# Patient Record
Sex: Female | Born: 1970
Health system: Southern US, Community
[De-identification: ages and names within clinical notes are randomized; demographics above are authoritative.]

## PROBLEM LIST (undated history)

## (undated) DIAGNOSIS — K219 Gastro-esophageal reflux disease without esophagitis: Secondary | ICD-10-CM

## (undated) DIAGNOSIS — E669 Obesity, unspecified: Secondary | ICD-10-CM

## (undated) DIAGNOSIS — I499 Cardiac arrhythmia, unspecified: Secondary | ICD-10-CM

## (undated) DIAGNOSIS — J189 Pneumonia, unspecified organism: Secondary | ICD-10-CM

## (undated) DIAGNOSIS — I10 Essential (primary) hypertension: Secondary | ICD-10-CM

## (undated) DIAGNOSIS — D259 Leiomyoma of uterus, unspecified: Secondary | ICD-10-CM

## (undated) DIAGNOSIS — E039 Hypothyroidism, unspecified: Secondary | ICD-10-CM

## (undated) DIAGNOSIS — E119 Type 2 diabetes mellitus without complications: Secondary | ICD-10-CM

## (undated) HISTORY — DX: Leiomyoma of uterus, unspecified: D25.9

## (undated) HISTORY — DX: Hypothyroidism, unspecified: E03.9

## (undated) HISTORY — DX: Gastro-esophageal reflux disease without esophagitis: K21.9

## (undated) HISTORY — DX: Obesity, unspecified: E66.9

## (undated) HISTORY — DX: Type 2 diabetes mellitus without complications: E11.9

---

## 1998-05-05 HISTORY — PX: DILATION AND CURETTAGE OF UTERUS: SHX78

## 2005-03-14 ENCOUNTER — Encounter (INDEPENDENT_AMBULATORY_CARE_PROVIDER_SITE_OTHER): Payer: Self-pay | Admitting: *Deleted

## 2005-03-14 ENCOUNTER — Other Ambulatory Visit: Admission: RE | Admit: 2005-03-14 | Discharge: 2005-03-14 | Payer: Self-pay | Admitting: Internal Medicine

## 2005-03-14 ENCOUNTER — Ambulatory Visit: Payer: Self-pay | Admitting: Internal Medicine

## 2005-05-30 ENCOUNTER — Ambulatory Visit: Payer: Self-pay | Admitting: Internal Medicine

## 2005-11-07 ENCOUNTER — Ambulatory Visit: Payer: Self-pay | Admitting: Internal Medicine

## 2005-11-19 ENCOUNTER — Ambulatory Visit: Payer: Self-pay | Admitting: Internal Medicine

## 2005-12-03 ENCOUNTER — Ambulatory Visit: Payer: Self-pay | Admitting: Internal Medicine

## 2005-12-17 ENCOUNTER — Ambulatory Visit: Payer: Self-pay | Admitting: Internal Medicine

## 2006-01-21 ENCOUNTER — Ambulatory Visit: Payer: Self-pay | Admitting: *Deleted

## 2006-02-04 ENCOUNTER — Ambulatory Visit: Payer: Self-pay | Admitting: Internal Medicine

## 2006-02-20 ENCOUNTER — Ambulatory Visit: Payer: Self-pay | Admitting: Internal Medicine

## 2006-02-25 ENCOUNTER — Ambulatory Visit: Payer: Self-pay | Admitting: Internal Medicine

## 2006-04-08 ENCOUNTER — Ambulatory Visit: Payer: Self-pay | Admitting: Internal Medicine

## 2006-06-23 ENCOUNTER — Ambulatory Visit: Payer: Self-pay | Admitting: Internal Medicine

## 2006-07-17 ENCOUNTER — Ambulatory Visit: Payer: Self-pay | Admitting: Family Medicine

## 2006-07-22 DIAGNOSIS — I10 Essential (primary) hypertension: Secondary | ICD-10-CM | POA: Insufficient documentation

## 2006-10-12 ENCOUNTER — Ambulatory Visit: Payer: Self-pay | Admitting: Internal Medicine

## 2006-11-16 ENCOUNTER — Ambulatory Visit: Payer: Self-pay | Admitting: Internal Medicine

## 2006-11-16 ENCOUNTER — Telehealth: Payer: Self-pay | Admitting: Internal Medicine

## 2007-04-05 DIAGNOSIS — R87610 Atypical squamous cells of undetermined significance on cytologic smear of cervix (ASC-US): Secondary | ICD-10-CM | POA: Insufficient documentation

## 2007-04-15 ENCOUNTER — Ambulatory Visit: Payer: Self-pay | Admitting: Internal Medicine

## 2007-04-22 ENCOUNTER — Encounter: Payer: Self-pay | Admitting: Internal Medicine

## 2007-05-14 ENCOUNTER — Ambulatory Visit: Payer: Self-pay | Admitting: Internal Medicine

## 2007-05-14 DIAGNOSIS — L919 Hypertrophic disorder of the skin, unspecified: Secondary | ICD-10-CM

## 2007-05-14 DIAGNOSIS — L909 Atrophic disorder of skin, unspecified: Secondary | ICD-10-CM | POA: Insufficient documentation

## 2007-10-04 ENCOUNTER — Ambulatory Visit: Payer: Self-pay | Admitting: Internal Medicine

## 2007-10-05 ENCOUNTER — Encounter: Payer: Self-pay | Admitting: Internal Medicine

## 2007-10-14 ENCOUNTER — Ambulatory Visit: Payer: Self-pay | Admitting: Family Medicine

## 2007-10-14 ENCOUNTER — Encounter (INDEPENDENT_AMBULATORY_CARE_PROVIDER_SITE_OTHER): Payer: Self-pay | Admitting: Internal Medicine

## 2007-10-14 LAB — CONVERTED CEMR LAB: KOH Prep: 0

## 2007-10-22 LAB — CONVERTED CEMR LAB: Chlamydia, DNA Probe: NEGATIVE

## 2007-11-10 ENCOUNTER — Telehealth (INDEPENDENT_AMBULATORY_CARE_PROVIDER_SITE_OTHER): Payer: Self-pay | Admitting: Internal Medicine

## 2008-04-18 ENCOUNTER — Ambulatory Visit: Payer: Self-pay | Admitting: Internal Medicine

## 2008-04-21 ENCOUNTER — Encounter: Payer: Self-pay | Admitting: Internal Medicine

## 2008-06-13 ENCOUNTER — Ambulatory Visit: Payer: Self-pay | Admitting: Family Medicine

## 2008-10-10 ENCOUNTER — Ambulatory Visit: Payer: Self-pay | Admitting: Family Medicine

## 2008-10-18 ENCOUNTER — Ambulatory Visit: Payer: Self-pay | Admitting: Internal Medicine

## 2008-10-18 DIAGNOSIS — D259 Leiomyoma of uterus, unspecified: Secondary | ICD-10-CM | POA: Insufficient documentation

## 2008-10-18 DIAGNOSIS — K219 Gastro-esophageal reflux disease without esophagitis: Secondary | ICD-10-CM | POA: Insufficient documentation

## 2008-10-31 ENCOUNTER — Ambulatory Visit: Payer: Self-pay | Admitting: Obstetrics & Gynecology

## 2009-04-24 ENCOUNTER — Ambulatory Visit: Payer: Self-pay | Admitting: Internal Medicine

## 2009-04-30 ENCOUNTER — Encounter: Payer: Self-pay | Admitting: Internal Medicine

## 2009-05-09 ENCOUNTER — Ambulatory Visit: Payer: Self-pay | Admitting: Internal Medicine

## 2009-05-11 LAB — CONVERTED CEMR LAB
Free T4: 1.7 ng/dL — ABNORMAL HIGH (ref 0.6–1.6)
TSH: 0.02 microintl units/mL — ABNORMAL LOW (ref 0.35–5.50)

## 2009-05-23 ENCOUNTER — Telehealth: Payer: Self-pay | Admitting: Internal Medicine

## 2009-05-23 ENCOUNTER — Encounter: Payer: Self-pay | Admitting: Internal Medicine

## 2009-05-23 ENCOUNTER — Other Ambulatory Visit: Payer: Self-pay | Admitting: Internal Medicine

## 2009-05-24 ENCOUNTER — Ambulatory Visit: Payer: Self-pay | Admitting: Internal Medicine

## 2009-05-25 ENCOUNTER — Encounter: Payer: Self-pay | Admitting: Internal Medicine

## 2009-06-04 ENCOUNTER — Encounter: Payer: Self-pay | Admitting: Internal Medicine

## 2009-06-07 ENCOUNTER — Ambulatory Visit: Payer: Self-pay | Admitting: Endocrinology

## 2009-06-11 ENCOUNTER — Telehealth (INDEPENDENT_AMBULATORY_CARE_PROVIDER_SITE_OTHER): Payer: Self-pay | Admitting: *Deleted

## 2009-06-18 ENCOUNTER — Other Ambulatory Visit: Payer: Self-pay | Admitting: Internal Medicine

## 2009-06-18 ENCOUNTER — Encounter: Payer: Self-pay | Admitting: Internal Medicine

## 2009-06-19 ENCOUNTER — Ambulatory Visit: Payer: Self-pay | Admitting: Endocrinology

## 2009-06-19 ENCOUNTER — Encounter: Payer: Self-pay | Admitting: Endocrinology

## 2009-06-25 ENCOUNTER — Ambulatory Visit: Payer: Self-pay | Admitting: Endocrinology

## 2009-07-31 ENCOUNTER — Ambulatory Visit: Payer: Self-pay | Admitting: Endocrinology

## 2009-08-02 ENCOUNTER — Ambulatory Visit: Payer: Self-pay | Admitting: Endocrinology

## 2009-08-02 LAB — CONVERTED CEMR LAB: Free T4: 1 ng/dL (ref 0.6–1.6)

## 2009-08-10 ENCOUNTER — Ambulatory Visit: Payer: Self-pay | Admitting: Internal Medicine

## 2009-08-13 ENCOUNTER — Ambulatory Visit: Payer: Self-pay | Admitting: Internal Medicine

## 2009-08-31 ENCOUNTER — Encounter (INDEPENDENT_AMBULATORY_CARE_PROVIDER_SITE_OTHER): Payer: Self-pay | Admitting: *Deleted

## 2009-08-31 ENCOUNTER — Encounter: Payer: Self-pay | Admitting: Endocrinology

## 2009-09-06 ENCOUNTER — Encounter (INDEPENDENT_AMBULATORY_CARE_PROVIDER_SITE_OTHER): Payer: Self-pay | Admitting: *Deleted

## 2009-09-07 ENCOUNTER — Ambulatory Visit: Payer: Self-pay | Admitting: Endocrinology

## 2009-09-07 DIAGNOSIS — E039 Hypothyroidism, unspecified: Secondary | ICD-10-CM | POA: Insufficient documentation

## 2009-10-10 ENCOUNTER — Encounter: Payer: Self-pay | Admitting: Endocrinology

## 2009-10-23 ENCOUNTER — Ambulatory Visit: Payer: Self-pay | Admitting: Endocrinology

## 2009-10-23 ENCOUNTER — Telehealth: Payer: Self-pay | Admitting: Endocrinology

## 2009-11-02 ENCOUNTER — Ambulatory Visit: Payer: Self-pay | Admitting: Internal Medicine

## 2009-11-02 DIAGNOSIS — E039 Hypothyroidism, unspecified: Secondary | ICD-10-CM | POA: Insufficient documentation

## 2009-11-28 ENCOUNTER — Encounter: Payer: Self-pay | Admitting: Endocrinology

## 2009-12-04 ENCOUNTER — Ambulatory Visit: Payer: Self-pay | Admitting: Endocrinology

## 2009-12-10 ENCOUNTER — Ambulatory Visit: Payer: Self-pay | Admitting: Family Medicine

## 2009-12-10 DIAGNOSIS — N898 Other specified noninflammatory disorders of vagina: Secondary | ICD-10-CM | POA: Insufficient documentation

## 2009-12-13 ENCOUNTER — Telehealth: Payer: Self-pay | Admitting: Family Medicine

## 2009-12-14 ENCOUNTER — Ambulatory Visit: Payer: Self-pay | Admitting: Family Medicine

## 2009-12-14 DIAGNOSIS — R109 Unspecified abdominal pain: Secondary | ICD-10-CM | POA: Insufficient documentation

## 2009-12-14 LAB — CONVERTED CEMR LAB: Whiff Test: POSITIVE

## 2009-12-17 ENCOUNTER — Encounter: Payer: Self-pay | Admitting: Family Medicine

## 2009-12-19 ENCOUNTER — Ambulatory Visit: Payer: Self-pay | Admitting: Family Medicine

## 2009-12-19 ENCOUNTER — Encounter: Payer: Self-pay | Admitting: Family Medicine

## 2010-01-01 ENCOUNTER — Ambulatory Visit: Payer: Self-pay | Admitting: Family Medicine

## 2010-01-28 ENCOUNTER — Encounter: Payer: Self-pay | Admitting: Endocrinology

## 2010-01-29 ENCOUNTER — Encounter: Payer: Self-pay | Admitting: Endocrinology

## 2010-01-31 ENCOUNTER — Ambulatory Visit: Payer: Self-pay | Admitting: Obstetrics & Gynecology

## 2010-04-03 ENCOUNTER — Ambulatory Visit: Payer: Self-pay | Admitting: Obstetrics & Gynecology

## 2010-04-04 ENCOUNTER — Encounter: Payer: Self-pay | Admitting: Obstetrics & Gynecology

## 2010-04-04 LAB — CONVERTED CEMR LAB
Clue Cells Wet Prep HPF POC: NONE SEEN
Trich, Wet Prep: NONE SEEN

## 2010-04-18 ENCOUNTER — Ambulatory Visit: Payer: Self-pay | Admitting: Obstetrics and Gynecology

## 2010-04-19 ENCOUNTER — Encounter: Payer: Self-pay | Admitting: Internal Medicine

## 2010-04-20 LAB — CONVERTED CEMR LAB
Trich, Wet Prep: NONE SEEN
Yeast Wet Prep HPF POC: NONE SEEN

## 2010-04-23 ENCOUNTER — Ambulatory Visit: Payer: Self-pay | Admitting: Internal Medicine

## 2010-05-22 ENCOUNTER — Encounter: Payer: Self-pay | Admitting: Internal Medicine

## 2010-05-22 ENCOUNTER — Ambulatory Visit
Admission: RE | Admit: 2010-05-22 | Discharge: 2010-05-22 | Payer: Self-pay | Source: Home / Self Care | Attending: Internal Medicine | Admitting: Internal Medicine

## 2010-05-22 DIAGNOSIS — J069 Acute upper respiratory infection, unspecified: Secondary | ICD-10-CM | POA: Insufficient documentation

## 2010-06-02 LAB — CONVERTED CEMR LAB
ALT: 20 units/L (ref 0–35)
AST: 19 units/L (ref 0–37)
Albumin: 3.8 g/dL (ref 3.5–5.2)
Blood in Urine, dipstick: NEGATIVE
Eosinophils Relative: 1.9 % (ref 0.0–5.0)
GFR calc non Af Amer: 101.71 mL/min (ref >60)
Glucose, Bld: 81 mg/dL (ref 70–99)
HCT: 39.5 % (ref 36.0–46.0)
Hemoglobin: 12.6 g/dL (ref 12.0–15.0)
Ketones, urine, test strip: NEGATIVE
Lymphs Abs: 1.5 10*3/uL (ref 0.7–4.0)
MCHC: 32 g/dL (ref 30.0–36.0)
MCV: 83.1 fL (ref 78.0–100.0)
Monocytes Relative: 16.5 % — ABNORMAL HIGH (ref 3.0–12.0)
Neutro Abs: 2.2 10*3/uL (ref 1.4–7.7)
Neutrophils Relative %: 48.4 % (ref 43.0–77.0)
Nitrite: NEGATIVE
RDW: 13.7 % (ref 11.5–14.6)
Sodium: 137 meq/L (ref 135–145)
Specific Gravity, Urine: <1.005
Total Bilirubin: 0.7 mg/dL (ref 0.3–1.2)
Total Protein: 7.9 g/dL (ref 6.0–8.3)
Urobilinogen, UA: 0.2
WBC: 4.6 10*3/uL (ref 4.5–10.5)
pH: 7.5

## 2010-06-03 ENCOUNTER — Telehealth: Payer: Self-pay | Admitting: Internal Medicine

## 2010-06-05 NOTE — Progress Notes (Signed)
Summary: abd pain  Phone Note Call from Patient Call back at Home Phone [phone]   Caller: Patient Summary of Call: Pt was seen on monday for a yeast infection and given diflucan.  After she took it she says her stomach starting hurting and still does, lower abd pain.  She had some nausea and dizziness the day after she took the diflucan, no other sxs now, just the pain, which she states is not severe.  I advised pt that these sxs are all possible side effects of the medicine but she is asking how long will the pain last.   Initial call taken by: Lowella Petties CMA,  December 13, 2009 9:06 AM  Follow-up for Phone Call        She should be feeling better.  If not better by tomorrow, needs to be seen. Ruthe Mannan MD  December 13, 2009 9:11 AM  Advised pt, she went ahead and scheduled appt, will call early to cancel if she is feeling better. Follow-up by: Lowella Petties CMA,  December 13, 2009 9:40 AM

## 2010-06-05 NOTE — Assessment & Plan Note (Signed)
Summary: ABD PAIN   Vital Signs:  Patient profile:   40 year old female Height:      67 inches Weight:      169.38 pounds BMI:     26.62 Temp:     100.2 degrees F oral Pulse rate:   72 / minute Pulse rhythm:   regular BP sitting:   120 / 82  (right arm) Cuff size:   regular  Vitals Entered By: Linde Gillis CMA Duncan Dull) (December 14, 2009 2:39 PM) CC: abdominal pain getting worse   History of Present Illness: 40 yo here for follow up yeast infection.  Saw her 4 days ago with itching and vaginal discharge.  Wet prep consistent with yeast infection.  She has one sexual partner and was not concerned about STDs and refused testing at the time.  Since finishing Diflucan, actually feels worse.  Has lower abdominal pain, fever, worsening discharge. No dysuria, increased urinary frequency or back pain.  Current Medications (verified): 1)  Maxzide-25 37.5-25 Mg Tabs (Triamterene-Hctz) .... Take One By Mouth Daily 2)  Aspirin 81 Mg Tbec (Aspirin) .... Take 1 Tablet By Mouth Once A Day 3)  Omeprazole 20 Mg  Tbec (Omeprazole) .Marland Kitchen.. 1 Daily As Needed For Heartburn 4)  Guanfacine Hcl 1 Mg Tabs (Guanfacine Hcl) .Marland Kitchen.. 1 At Bedtime 5)  Tsh .... 242.9.  Please Do Late Sept 2011 6)  Levothyroxine Sodium 100 Mcg Tabs (Levothyroxine Sodium) .Marland Kitchen.. 1 Once Daily 7)  Diflucan 150 Mg Tabs (Fluconazole) .... Once Daily 8)  Doxycycline Hyclate 100 Mg Caps (Doxycycline Hyclate) .... Take 1 Tab Twice A Day X 2 Weeks 9)  Flagyl 500 Mg Tabs (Metronidazole) .Marland Kitchen.. 1 Tab By Mouth Two Times A Day X 14 Days  Allergies (verified): No Known Drug Allergies  Past History:  Past Medical History: Last updated: 11/02/2009 Hypertension GERD Uterine fibroids Hypothyroidism  (following radioactive iodine Rx)  Past Surgical History: Last updated: 11/02/2009 D&C  2000 SVD X 1 1989 2011 Radioactive iodine Rx for Grave's disease  Family History: Last updated: 06-24-2009 Dad  died @74 --CHF---CAD(CABG), DM, HTN Mom in  60's--HTN Pat GF died o lung cancer Mat GM died of ?intestinal cancer/pulm. embolus 1 sister with cerebral palsy, HTN Sister has arthritis unceratin if any thyroid probs.  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 GI:  Denies nausea and vomiting. GU:  Complains of discharge; denies genital sores, urinary frequency, and urinary hesitancy.  Physical Exam  General:  alert and normal appearance.   Genitalia:  normal introitus, no external lesions, normal uterus size and position, no adnexal masses. Copious, grey vaginal discharge, malodorous, does have some cervical motion tenderness (mild) Psych:  normally interactive, good eye contact, not anxious appearing, and not depressed appearing.     Impression & Recommendations:  Problem # 1:  ABDOMINAL PAIN OTHER SPECIFIED SITE (ICD-789.09) Assessment New with pos cervical motion tenderness with fever. Concern for PID.  Pt declined giving urine sample.  Wet prep also pos for BV (see below). Will treat with Doxycycline, flagyl, Ceftriaxone.  Red flags given requiring immediate follow up over the weekend. Her updated medication list for this problem includes:    Aspirin 81 Mg Tbec (Aspirin) .Marland Kitchen... Take 1 tablet by mouth once a day  Orders: UA Dipstick w/o Micro (manual) ([address])  Problem # 2:  VAGINAL DISCHARGE (ICD-623.5) Assessment: New consistent with BV.  Will treat with flagyl.  See above.  Will also check GC/Chlamydia. Orders: Wet Prep ([id number]) T-Chlamydia Probe, genital [phone]) T-GC Probe, genital ([id number]) Specimen Handling (46962)  Complete Medication List: 1)  Maxzide-25 37.5-25 Mg Tabs (Triamterene-hctz) .... Take one by mouth daily 2)  Aspirin 81 Mg Tbec (Aspirin) .... Take 1 tablet by mouth once a day 3)   Omeprazole 20 Mg Tbec (Omeprazole) .Marland Kitchen.. 1 daily as needed for heartburn 4)  Guanfacine Hcl 1 Mg Tabs (Guanfacine hcl) .Marland Kitchen.. 1 at bedtime 5)  Tsh  .... 242.9.  please do late sept 2011 6)  Levothyroxine Sodium 100 Mcg Tabs (Levothyroxine sodium) .Marland Kitchen.. 1 once daily 7)  Diflucan 150 Mg Tabs (Fluconazole) .... Once daily 8)  Doxycycline Hyclate 100 Mg Caps (Doxycycline hyclate) .... Take 1 tab twice a day x 2 weeks 9)  Flagyl 500 Mg Tabs (Metronidazole) .Marland Kitchen.. 1 tab by mouth two times a day x 14 days Prescriptions: DIFLUCAN 150 MG TABS (FLUCONAZOLE) once daily  #1 x 0   Entered and Authorized by:   Ruthe Mannan MD   Signed by:   Ruthe Mannan MD on 12/14/2009   Method used:   Print then Give to Patient   RxID:   [id number] FLAGYL 500 MG TABS (METRONIDAZOLE) 1 tab by mouth two times a day x 14 days  #28 x 0   Entered and Authorized by:   Ruthe Mannan MD   Signed by:   Ruthe Mannan MD on 12/14/2009   Method used:   Electronically to        [facility] (retail)       [address]       [place], [place]  [address]       Ph: [phone]       Fax: [phone]   RxID:   [id number] DOXYCYCLINE HYCLATE 100 MG CAPS (DOXYCYCLINE HYCLATE) Take 1 tab twice a day x 2 weeks  #28 x 0   Entered and Authorized by:   Ruthe Mannan MD   Signed by:   Ruthe Mannan MD on 12/14/2009   Method used:   Electronically to        [facility] (retail)       [address]       [place], [place]  [address]       Ph: [phone]       Fax: [phone]   RxID:   [id number]   Current Allergies (reviewed today): No known allergies   Laboratory Results    Wet [place]/KOH Source: vaginal WBC/hpf 1-5 Bacteria/hpf 2+ Clue cells/hpf moderate  Positive whiff Yeast/hpf none Trichomonas/hpf none

## 2010-06-05 NOTE — Assessment & Plan Note (Signed)
Summary: new endo / graves ds / [facility]ak / [facility] / cd   Vital Signs:  Patient profile:   40 year old female Height:      67 inches (170.18 cm) Weight:      173.50 pounds (78.86 kg) O2 Sat:      99 % on Room air Temp:     97.1 degrees F (36.17 degrees C) oral Pulse rate:   99 / minute BP sitting:   136 / 88  (left arm) Cuff size:   large  Vitals Entered By: Josph Macho CMA (June 07, 2009 8:54 AM)  O2 Flow:  Room air CC: New Endo: Graves Disease/ CF Is Patient Diabetic? No   Referring Provider:  Cindee Salt MD Primary Provider:  Cindee Salt MD  CC:  New EndoLuiz Blare Disease/ CF.  History of Present Illness: pt states few mos of palpitations in the chest, and associated moderate anxiety.  she says she is not considering a pregnancy, but says it could happen.  Current Medications (verified): 1)  Maxzide-25 37.5-25 Mg Tabs (Triamterene-Hctz) .... Take One By Mouth Daily 2)  Aspirin 81 Mg Tbec (Aspirin) .... Take 1 Tablet By Mouth Once A Day 3)  Omeprazole 20 Mg  Tbec (Omeprazole) .Marland Kitchen.. 1 Daily As Needed For Heartburn  Allergies (verified): No Known Drug Allergies  Past History:  Past Medical History: Last updated: 10/18/2008 Hypertension GERD Uterine fibroids  Family History: Reviewed history from 04/18/2008 and no changes required. Dad  died @74 --CHF---CAD(CABG), DM, HTN Mom in 60's--HTN Pat GF died o lung cancer Mat GM died of ?intestinal cancer/pulm. embolus 1 sister with cerebral palsy, HTN Sister has arthritis unceratin if any thyroid probs.  Social History: Reviewed history from 07/22/2006 and no changes required. 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         denies weight loss, headache, hoarseness, double vision, sob, diarrhea, excessive diaphoresis, numbness, bruising, and rhinorrhea.  she reports decreased duration of  menses, and slight chest pain.  she attributes polyuria to diuretic.  she has intermittent myalgias of the legs, and tremor of the hands.  she reports diffuse hair loss.   Physical Exam  General:  normal appearance.   Head:  head: no deformity eyes: no periorbital swelling, no proptosis external nose and ears are normal mouth: no lesion seen Neck:  thyroid is minimally enlarged, if at all.  no nodule Additional Exam:  outside test results are reviewed:  scan: grave's dz FastTSH              [L]  0.02 uIU/mL                 0.35-5.50 Free T4              [H]  1.7 ng/dL     Impression & Recommendations:  Problem # 1:  HYPERTHYROIDISM (ICD-242.90) due to grave's dz  Problem # 2:  alopecia may be autoimmune  Problem # 3:  myalgias ? related to #1 or 2  Medications Added to Medication List This Visit: 1)  Toprol Xl 25 Mg Xr24h-tab (Metoprolol succinate) .Marland Kitchen.. 1 qd  Other Orders: Radiology Referral (Radiology) Consultation Level IV ([address]109)  Patient Instructions: 1)  we discussed the causes, risks, and treatment options of hyperthyroidism (overactive thyroid).  please think it over and let me know. 2)  you should delay pregnancy  until advised it is safe. 3)  metoprolol-xr 25 mg once daily 4)  please let me know if you wish to take hydrocortisone lotion to the scalp, but i do  not know if this will work. Prescriptions: TOPROL XL 25 MG XR24H-TAB (METOPROLOL SUCCINATE) 1 qd  #30 x 3   Entered and Authorized by:   Minus Breeding MD   Signed by:   Minus Breeding MD on 06/07/2009   Method used:   Electronically to        [facility] (retail)       [address]       [place], [place]  [address]       Ph: [phone]       Fax: [phone]   RxID:   [id number]

## 2010-06-05 NOTE — Miscellaneous (Signed)
Summary: Orders Update   Clinical Lists Changes  Orders: Added new Referral order of Radiology Referral (Radiology) - Signed 

## 2010-06-05 NOTE — Letter (Signed)
Summary: Results Follow up Letter  [place] [facility]  [address] [place], [place] [address]   Phone: [phone]  Fax: [phone]    06/04/2009 MRN: [id number]  [facility] [address] [place], [place]  [address]  Dear Ms. Meter,  The following are the results of your recent test(s):  Test         Result    Pap Smear:        Normal _____  Not Normal _____ Comments: ______________________________________________________ Cholesterol: LDL(Bad cholesterol):         Your goal is less than:         HDL (Good cholesterol):       Your goal is more than: Comments:  ______________________________________________________ Mammogram:        Normal __X___  Not Normal _____ Comments: Repeat in 1 year  ___________________________________________________________________ Hemoccult:        Normal _____  Not normal _______ Comments:    _____________________________________________________________________ Other Tests:    We routinely do not discuss normal results over the telephone.  If you desire a copy of the results, or you have any questions about this information we can discuss them at your next office visit.   Sincerely,       Tillman Abide, MD

## 2010-06-05 NOTE — Assessment & Plan Note (Signed)
Summary: ROA FOLLOW-UP/JRR   Vital Signs:  Patient profile:   40 year old female Weight:      170 pounds Temp:     99.1 degrees F oral Resp:     14 per minute BP sitting:   128 / 90  (left arm) Cuff size:   large  Vitals Entered By: Mervin Hack CMA Duncan Dull) (August 13, 2009 4:30 PM) CC: follow-up visit   History of Present Illness: Feels much better Still with cough  no problems with the shot mild dizziness with the avelox  breathing is much better today--finally can talk without getting SOB  Nausea is better eating some slight sweat with coughing spells no obvious fever  Allergies: No Known Drug Allergies  Past History:  Past medical, surgical, family and social histories (including risk factors) reviewed for relevance to current acute and chronic problems.  Past Medical History: Reviewed history from 10/18/2008 and no changes required. Hypertension GERD Uterine fibroids  Past Surgical History: Reviewed history from 10/04/2007 and no changes required. D&C  2000 SVD X 1 1989  Family History: Reviewed history from 06/07/2009 and no changes required. Dad  died @74 --CHF---CAD(CABG), DM, HTN Mom in 60's--HTN Pat GF died o lung cancer Mat GM died of ?intestinal cancer/pulm. embolus 1 sister with cerebral palsy, HTN Sister has arthritis unceratin if any thyroid probs.  Social History: Reviewed history from 07/22/2006 and no changes required. 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   Mouth:  no erythema and no exudates.   Neck:  supple, no masses, and no cervical lymphadenopathy.   Lungs:  normal respiratory effort, no intercostal retractions, and no accessory muscle use.  Improved aeration at left base with some crackles   Impression &  Recommendations:  Problem # 1:  BRONCHOPNEUMONIA ORGANISM UNSPECIFIED (ICD-485) Assessment Improved  will continue the avelox to 2 weeks given multilobar involvement she will call if not able to tolerate (change to azithromycin)  Rx for the tramadol (forgot to send on Friday) recheck CXR in  ~1 month  Her updated medication list for this problem includes:    Avelox 400 Mg Tabs (Moxifloxacin hcl) .Marland Kitchen... 1 tab daily for pneumonia  Complete Medication List: 1)  Maxzide-25 37.5-25 Mg Tabs (Triamterene-hctz) .... Take one by mouth daily 2)  Aspirin 81 Mg Tbec (Aspirin) .... Take 1 tablet by mouth once a day 3)  Omeprazole 20 Mg Tbec (Omeprazole) .Marland Kitchen.. 1 daily as needed for heartburn 4)  Guanfacine Hcl 1 Mg Tabs (Guanfacine hcl) .Marland Kitchen.. 1 at bedtime 5)  Tsh and Free T4  .... 242.9.  please do late april 2011 6)  Promethazine Hcl 25 Mg Tabs (Promethazine hcl) .Marland Kitchen.. 1 tab by mouth three times a day as needed for nausea 7)  Tramadol Hcl 50 Mg Tabs (Tramadol hcl) .... 1/2-1 tab by mouth three times a day for cough 8)  Avelox 400 Mg Tabs (Moxifloxacin hcl) .Marland Kitchen.. 1 tab daily for pneumonia  Patient Instructions: 1)  Please keep appt in June. 2)  We will repeat the chest x-ray at that visit Prescriptions: AVELOX 400 MG TABS (MOXIFLOXACIN HCL) 1 tab daily for pneumonia  #10 x 0   Entered and Authorized by:   Cindee Salt MD   Signed by:   Cindee Salt MD  on 08/13/2009   Method used:   Electronically to        [facility] (retail)       [address]       [place], [place]  [address]       Ph: [phone]       Fax: [phone]   RxID:   [id number] TRAMADOL HCL 50 MG TABS (TRAMADOL HCL) 1/2-1 tab by mouth three times a day for cough  #40 x 0   Entered and Authorized by:   Cindee Salt MD   Signed by:   Cindee Salt MD on 08/13/2009   Method used:   Electronically to        [facility] (retail)       [address]       [place], [place]  [address]       Ph: [phone]       Fax: [phone]   RxID:   [id number]   Current Allergies (reviewed today): No known allergies

## 2010-06-05 NOTE — Miscellaneous (Signed)
  Clinical Lists Changes  Medications: Removed medication of LEVOTHYROXINE SODIUM 100 MCG TABS (LEVOTHYROXINE SODIUM) 1 once daily Added new medication of LEVOTHYROXINE SODIUM 112 MCG TABS (LEVOTHYROXINE SODIUM) 1 tab once daily - Signed Rx of LEVOTHYROXINE SODIUM 112 MCG TABS (LEVOTHYROXINE SODIUM) 1 tab once daily;  #30 x 5;  Signed;  Entered by: Minus Breeding MD;  Authorized by: Minus Breeding MD;  Method used: Electronically to [facility] [address], [place], [place], [place]  [address], Ph: [phone], Fax: [phone]    Prescriptions: LEVOTHYROXINE SODIUM 112 MCG TABS (LEVOTHYROXINE SODIUM) 1 tab once daily  #30 x 5   Entered and Authorized by:   Minus Breeding MD   Signed by:   Minus Breeding MD on 01/29/2010   Method used:   Electronically to        [facility] (retail)       [address]       [place], [place]  [address]       Ph: [phone]       Fax: [phone]   RxID:   [id number]

## 2010-06-05 NOTE — Assessment & Plan Note (Signed)
Summary: 5 WK FU/NWS   Vital Signs:  Patient profile:   40 year old female Height:      67 inches (170.18 cm) Weight:      173.13 pounds (78.70 kg) BMI:     27.21 O2 Sat:      89 % on Room air Temp:     98.1 degrees F (36.72 degrees C) oral Pulse rate:   84 / minute BP sitting:   102 / 80  (left arm) Cuff size:   regular  Vitals Entered By: Brenton Grills MA (October 23, 2009 11:10 AM)  O2 Flow:  Room air CC: 5 wk f/u endo/aj   Referring Provider:  Cindee Salt MD Primary Provider:  Cindee Salt MD  CC:  5 wk f/u endo/aj.  History of Present Illness: pt is now 4 mos s/p i-131 for hyperthyroidism.  she takes synthroid 100 micrograms/day.  pt states she feels well in general, except for bilateral leg pain.    Current Medications (verified): 1)  Maxzide-25 37.5-25 Mg Tabs (Triamterene-Hctz) .... Take One By Mouth Daily 2)  Aspirin 81 Mg Tbec (Aspirin) .... Take 1 Tablet By Mouth Once A Day 3)  Omeprazole 20 Mg  Tbec (Omeprazole) .Marland Kitchen.. 1 Daily As Needed For Heartburn 4)  Guanfacine Hcl 1 Mg Tabs (Guanfacine Hcl) .Marland Kitchen.. 1 At Bedtime 5)  Tsh and Free T4 .... 242.9.  Please Do June 2011 6)  Levothyroxine Sodium 100 Mcg Tabs (Levothyroxine Sodium) .Marland Kitchen.. 1 Once Daily  Allergies (verified): No Known Drug Allergies  Past History:  Past Medical History: Last updated: 10/18/2008 Hypertension GERD Uterine fibroids  Review of Systems  The patient denies weight loss and weight gain.    Physical Exam  General:  normal appearance.   Neck:  thyroid is non-palpable. Additional Exam:  test results are reviewed:  (elevated tsh is noted)   Impression & Recommendations:  Problem # 1:  HYPOTHYROIDISM, POST-RADIATION (ICD-244.1) needs increased rx  Medications Added to Medication List This Visit: 1)  Tsh and Free T4  .... 242.9.  please do late july, or early aug 2011 2)  Levothyroxine Sodium 125 Mcg Tabs (Levothyroxine sodium) .Marland Kitchen.. 1 once daily  Other Orders: Est.  Patient Level III ([address]40)  Patient Instructions: 1)  for now, continue levothyroxine 100 micrograms/day 2)  please call [phone] to hear your test results. 3)  Please schedule a follow-up appointment in 6 weeks with labs prior ([facility]). 4)  update:  pt is notified by phone:  increase synthroid to 125/day) Prescriptions: LEVOTHYROXINE SODIUM 125 MCG TABS (LEVOTHYROXINE SODIUM) 1 once daily  #30 x 2   Entered and Authorized by:   Minus Breeding MD   Signed by:   Minus Breeding MD on 10/23/2009   Method used:   Electronically to        [facility] (retail)       [address]       [place], [place]  [address]       Ph: [phone]       Fax: [phone]   RxID:   [id number] TSH AND FREE T4 242.9.  please do late july, or early aug 2011  #0 x 0   Entered and Authorized by:   Minus Breeding MD   Signed by:   Minus Breeding MD on 10/23/2009   Method used:   Print then Give to Patient   RxID:  [id number]  

## 2010-06-05 NOTE — Assessment & Plan Note (Signed)
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  Past Medical History: Reviewed history from 10/18/2008 and no changes required. Hypertension GERD Uterine fibroids  Past Surgical History: Reviewed history from 10/04/2007 and no changes required. D&C  2000 SVD X 1 1989  Family History: Reviewed history from 06/07/2009 and no changes required. Dad  died @74 --CHF---CAD(CABG), DM, HTN Mom in 60's--HTN Pat GF died o lung cancer Mat GM died of ?intestinal cancer/pulm. embolus 1 sister with cerebral palsy, HTN Sister has arthritis unceratin if any thyroid probs.  Social History: Reviewed history from 07/22/2006 and no changes required. 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   Neck:  supple, no masses, no carotid bruits, and no cervical lymphadenopathy.   Lungs:  normal respiratory effort, no intercostal retractions, no accessory muscle use, and no dullness.   Decreased breath sounds at left base Abdomen:  soft.  Slight generalized tenderness no rebound no masses Additional Exam:  CXR shows LLL infiltrate   Impression & Recommendations:  Problem # 1:  BRONCHOPNEUMONIA ORGANISM UNSPECIFIED (ICD-485) Assessment New  cough with the vomiting suggested pneumonia PE and CXR corroborate  will give rocephin here avelox samples phenergan for nausea, tramadol for cough see back in 3 days out of work at least till Tuesday  Orders: Rocephin  250mg  (Z6109) Admin of Therapeutic Inj  intramuscular or subcutaneous ([address])  Complete Medication List: 1)  Maxzide-25 37.5-25 Mg Tabs (Triamterene-hctz) .... Take one by mouth daily 2)  Aspirin 81 Mg Tbec (Aspirin) .... Take 1 tablet by mouth once a day 3)  Omeprazole 20 Mg Tbec (Omeprazole) .Marland Kitchen.. 1 daily as needed for heartburn 4)  Guanfacine Hcl 1 Mg Tabs (Guanfacine hcl) .Marland Kitchen.. 1 at bedtime 5)  Tsh and Free T4  .... 242.9.  please do late april 2011 6)  Promethazine Hcl 25 Mg Tabs (Promethazine hcl) .Marland Kitchen.. 1 tab by mouth three times a day as needed for nausea 7)  Tramadol Hcl 50 Mg Tabs (Tramadol hcl) .... 1/2-1 tab by mouth three times a day for cough  Other Orders: CXR- 2view (CXR)  Patient Instructions: 1)  Schedule follow up appt on Monday 2)  Take the promethazine if needed tonight to calm stomach. Eat at least a little, then take the avelox 3)  Starting tomorrow, take the avelox after breakfast each morning Prescriptions: PROMETHAZINE HCL 25 MG TABS (PROMETHAZINE HCL) 1 tab by mouth three times a day as needed for nausea  #15 x 0   Entered and Authorized by:   Cindee Salt MD   Signed by:   Cindee Salt MD on 08/10/2009   Method used:   Electronically to        [facility] (retail)       [address]       [place], [place]  [address]       Ph: [phone]       Fax: [phone]   RxID:   [id number]   Current Allergies (reviewed today): No known allergies    Medication Administration  Injection # 1:    Medication: Rocephin  250mg     Diagnosis: BRONCHOPNEUMONIA ORGANISM UNSPECIFIED (ICD-485)    Route: IM    Site: LUOQ gluteus    Exp Date: 12/04/2011    Lot #: [id number]    Mfr: NOVAPLUS    Comments: patient received 1 gram    Patient tolerated injection without complications    Given by: Mervin Hack CMA Duncan Dull) (August 10, 2009 1:28 PM)  Orders Added: 1)  CXR- 2view [CXR] 2)  Est. Patient Level IV [401[address]] 3)  Rocephin  250mg  [J0696] 4)  Admin of Therapeutic Inj  intramuscular or subcutaneous [72536]

## 2010-06-05 NOTE — Assessment & Plan Note (Signed)
Summary: f/u appt per pt/#/cd   Vital Signs:  Patient profile:   40 year old female Height:      67 inches (170.18 cm) Weight:      175.50 pounds (79.77 kg) O2 Sat:      97 % on Room air Temp:     97.0 degrees F (36.11 degrees C) oral Pulse rate:   101 / minute BP sitting:   148 / 100  (left arm) Cuff size:   large  Vitals Entered By: Josph Macho RMA (June 25, 2009 8:46 AM)  O2 Flow:  Room air CC: follow-up visit/ CF Is Patient Diabetic? No   Referring Provider:  Cindee Salt MD Primary Provider:  Cindee Salt MD  CC:  follow-up visit/ CF.  History of Present Illness: pt had i-131 rx for hyperthyroidism 6 days ago.  she does not feel better yet. she stopped metoprolol due to insomnia. pt states few days of nasal congestion and rhinorrhea, and dry cough.  Current Medications (verified): 1)  Maxzide-25 37.5-25 Mg Tabs (Triamterene-Hctz) .... Take One By Mouth Daily 2)  Aspirin 81 Mg Tbec (Aspirin) .... Take 1 Tablet By Mouth Once A Day 3)  Omeprazole 20 Mg  Tbec (Omeprazole) .Marland Kitchen.. 1 Daily As Needed For Heartburn 4)  Toprol Xl 25 Mg Xr24h-Tab (Metoprolol Succinate) .Marland Kitchen.. 1 Qd  Allergies (verified): No Known Drug Allergies  Past History:  Past Medical History: Last updated: 10/18/2008 Hypertension GERD Uterine fibroids  Review of Systems  The patient denies fever and dyspnea on exertion.    Physical Exam  General:  normal appearance.   Neck:  thyroid is minimally enlarged, with an irregular surface.  no nodule Neurologic:  no tremor Skin:  not diaphoretic   Impression & Recommendations:  Problem # 1:  HYPERTHYROIDISM (ICD-242.90)  Problem # 2:  insomnia ? due to metoprolol  Problem # 3:  allergic rhinitis  Problem # 4:  HYPERTENSION (ICD-401.9) needs increased rx  Medications Added to Medication List This Visit: 1)  Guanfacine Hcl 1 Mg Tabs (Guanfacine hcl) .Marland Kitchen.. 1 qhs  Other Orders: Est. Patient Level IV ([address])  Patient  Instructions: 1)  change metoprolol to guanfacine 1 mg each evening. 2)  take loratadine 10 mg once daily as needed for runny nose. 3)  you can also try saline nasal spray. 4)  return 1 month Prescriptions: GUANFACINE HCL 1 MG TABS (GUANFACINE HCL) 1 qhs  #30 x 5   Entered and Authorized by:   Minus Breeding MD   Signed by:   Minus Breeding MD on 06/25/2009   Method used:   Electronically to        [facility] (retail)       [address]       [place], [place]  [address]       Ph: [phone]       Fax: [phone]   RxID:   [id number]

## 2010-06-05 NOTE — Miscellaneous (Signed)
Summary: Lab results  Clinical Lists Changes  Observations: Added new observation of TSH: 32.3650 microintl units/mL (08/31/2009 15:33)      -  Date:  08/31/2009    TSH: 16.1096

## 2010-06-05 NOTE — Progress Notes (Signed)
Summary: thyroid  Phone Note Outgoing Call Call back at [facility] Phone [phone]   Call placed by: Brenton Grills MA,  October 23, 2009 4:41 PM Call placed to: Patient Reason for Call: Discuss lab or test results Details for Reason: thyroid  Summary of Call: informed pt of lab results and to return for f/u visit in 6 weeks Initial call taken by: Brenton Grills MA,  October 23, 2009 4:41 PM

## 2010-06-05 NOTE — Assessment & Plan Note (Signed)
Summary: 6 WKS FU  STC   Vital Signs:  Patient profile:   40 year old female Height:      67 inches (170.18 cm) Weight:      173.25 pounds (78.75 kg) BMI:     27.23 O2 Sat:      98 % on Room air Temp:     97.5 degrees F (36.39 degrees C) oral Pulse rate:   83 / minute BP sitting:   136 / 86  (left arm) Cuff size:   regular  Vitals Entered By: Brenton Grills MA (December 04, 2009 9:57 AM)  O2 Flow:  Room air CC: 6 wk f/u/aj   Referring Provider:  Cindee Salt MD Primary Provider:  Cindee Salt MD  CC:  6 wk f/u/aj.  History of Present Illness: pt is now 6 mos s/p i-131 therapy for hyperthyroidism due to grave's dz.  pt states she feels well in general.   Current Medications (verified): 1)  Maxzide-25 37.5-25 Mg Tabs (Triamterene-Hctz) .... Take One By Mouth Daily 2)  Aspirin 81 Mg Tbec (Aspirin) .... Take 1 Tablet By Mouth Once A Day 3)  Omeprazole 20 Mg  Tbec (Omeprazole) .Marland Kitchen.. 1 Daily As Needed For Heartburn 4)  Guanfacine Hcl 1 Mg Tabs (Guanfacine Hcl) .Marland Kitchen.. 1 At Bedtime 5)  Tsh and Free T4 .... 242.9.  Please Do Late July, or Early Aug 2011 6)  Levothyroxine Sodium 125 Mcg Tabs (Levothyroxine Sodium) .Marland Kitchen.. 1 Once Daily  Allergies (verified): No Known Drug Allergies  Past History:  Past Medical History: Last updated: 11/02/2009 Hypertension GERD Uterine fibroids Hypothyroidism  (following radioactive iodine Rx)  Review of Systems  The patient denies weight loss and weight gain.    Physical Exam  General:  normal appearance.   Neck:  thyroid is non-palpable. Additional Exam:  tsh=0.187   Impression & Recommendations:  Problem # 1:  HYPOTHYROIDISM, POST-RADIATION (ICD-244.1) well-replaced  Medications Added to Medication List This Visit: 1)  Tsh  .... 242.9.  please do late sept 2011 2)  Levothyroxine Sodium 100 Mcg Tabs (Levothyroxine sodium) .Marland Kitchen.. 1 once daily  Other Orders: Est. Patient Level III ([address])  Patient Instructions: 1)   reduce levothyroxine to 100 micrograms/day. 2)  in 6 weeks, go to lab for repeat thyroid blood tests.   3)  then please call [phone] to hear your test results. 4)  Please schedule a follow-up appointment in 6 months. Prescriptions: TSH 242.9.  please do late sept 2011  #0 x 0   Entered and Authorized by:   Minus Breeding MD   Signed by:   Minus Breeding MD on 12/04/2009   Method used:   Print then Give to Patient   RxID:   [id number] LEVOTHYROXINE SODIUM 100 MCG TABS (LEVOTHYROXINE SODIUM) 1 once daily  #30 x 5   Entered and Authorized by:   Minus Breeding MD   Signed by:   Minus Breeding MD on 12/04/2009   Method used:   Electronically to        [facility] (retail)       [address]       [place], [place]  [address]       Ph: [phone]       Fax: [phone]   RxID:   [id number]

## 2010-06-05 NOTE — Letter (Signed)
Summary: Out of Work  [employer] at [facility]  [address] [place], [place] [address]   Phone: [phone]  Fax: [phone]    December 14, 2009   Employee:  Kristina Gardner    To Whom It May Concern:   For Medical reasons, please excuse the above named employee from work for the following dates:  Start:   12/14/2009  End:   12/17/2009  If you need additional information, please feel free to contact our office.         Sincerely,    Ruthe Mannan MD

## 2010-06-05 NOTE — Letter (Signed)
Summary: Out of Work  [employer] at [facility]  [address] [place], [place] [address]   Phone: [phone]  Fax: [phone]    August 13, 2009   Employee:  Kristina Gardner    To Whom It May Concern:   For Medical reasons, please excuse the above named employee from work for the following dates:  Start:   08/13/2009  End:   08/15/2009 patient to return back to work.  If you need additional information, please feel free to contact our office.         Sincerely,      Tillman Abide, MD

## 2010-06-05 NOTE — Assessment & Plan Note (Signed)
Summary: 5 wk rov /nws   Vital Signs:  Patient profile:   40 year old female Height:      67 inches (170.18 cm) Weight:      174.13 pounds (79.15 kg) O2 Sat:      96 % on Room air Temp:     99.1 degrees F (37.28 degrees C) oral Pulse rate:   86 / minute BP sitting:   118 / 90  (left arm) Cuff size:   large  Vitals Entered By: Josph Macho RMA (Sep 07, 2009 10:05 AM)  O2 Flow:  Room air CC: 5 week follow up/ pt states she is no longer taking Promethazine, Tramadol, or Avelox/ CF Is Patient Diabetic? No   Referring Provider:  Cindee Salt MD Primary Provider:  Cindee Salt MD  CC:  5 week follow up/ pt states she is no longer taking Promethazine, Tramadol, and or Avelox/ CF.  History of Present Illness: pt is now almost 3 mos s/p i-131 therapy for hyperthyroidism.  pt states she feels well in general.   Current Medications (verified): 1)  Maxzide-25 37.5-25 Mg Tabs (Triamterene-Hctz) .... Take One By Mouth Daily 2)  Aspirin 81 Mg Tbec (Aspirin) .... Take 1 Tablet By Mouth Once A Day 3)  Omeprazole 20 Mg  Tbec (Omeprazole) .Marland Kitchen.. 1 Daily As Needed For Heartburn 4)  Guanfacine Hcl 1 Mg Tabs (Guanfacine Hcl) .Marland Kitchen.. 1 At Bedtime 5)  Tsh and Free T4 .... 242.9.  Please Do Late April 2011 6)  Promethazine Hcl 25 Mg Tabs (Promethazine Hcl) .Marland Kitchen.. 1 Tab By Mouth Three Times A Day As Needed For Nausea 7)  Tramadol Hcl 50 Mg Tabs (Tramadol Hcl) .... 1/2-1 Tab By Mouth Three Times A Day For Cough 8)  Avelox 400 Mg Tabs (Moxifloxacin Hcl) .Marland Kitchen.. 1 Tab Daily For Pneumonia  Allergies (verified): No Known Drug Allergies  Past History:  Past Medical History: Last updated: 10/18/2008 Hypertension GERD Uterine fibroids  Review of Systems       she has fatigue  Physical Exam  General:  normal appearance.   Neck:  thyroid is non-palpable. Skin:  normal texture and temp Additional Exam:  tsh=32   Impression & Recommendations:  Problem # 1:  HYPOTHYROIDISM,  POST-RADIATION (ICD-244.1) needs increased rx  Medications Added to Medication List This Visit: 1)  Tsh and Free T4  .... 242.9.  please do june 2011 2)  Levothyroxine Sodium 100 Mcg Tabs (Levothyroxine sodium) .Marland Kitchen.. 1 once daily  Other Orders: Est. Patient Level III ([address])  Patient Instructions: 1)  levothyroxine 100 micrograms/day 2)  Please schedule a follow-up appointment in 5 weeks with labs prior ([facility]). Prescriptions: TSH AND FREE T4 242.9.  please do june 2011  #0 x 0   Entered and Authorized by:   Minus Breeding MD   Signed by:   Minus Breeding MD on 09/07/2009   Method used:   Print then Give to Patient   RxID:   [id number] TSH AND FREE T4 242.9.  please do late april 2011  #0 x 0   Entered and Authorized by:   Minus Breeding MD   Signed by:   Minus Breeding MD on 09/07/2009   Method used:   Print then Give to Patient   RxID:   [id number] LEVOTHYROXINE SODIUM 100 MCG TABS (LEVOTHYROXINE SODIUM) 1 once daily  #30 x 1   Entered and Authorized by:   Minus Breeding MD   Signed by:  Minus Breeding MD on 09/07/2009   Method used:   Electronically to        [facility] (retail)       [address]       [place], [place]  [address]       Ph: [phone]       Fax: [phone]   RxID:   [id number]

## 2010-06-05 NOTE — Progress Notes (Signed)
Summary: Pregnancy test  Phone Note From Other Clinic Call back at [phone]   Caller: Nurse Summary of Call: [facility] needs you to please fax an     order for a Serum Pregnany test to their registration desk. This is required before having the Nuclear thyroid test being done tomorrow. The patient will go over to [facility] at 2:00pm today for the pregnancy tetst. .Please fax the order to  Dx registration desk [phone]. This is a pre-test requirement.  Initial call taken by: Carlton Adam,  May 23, 2009 9:04 AM  Follow-up for Phone Call        order written Follow-up by: Cindee Salt MD,  May 23, 2009 11:14 AM  Additional Follow-up for Phone Call Additional follow up Details #1::        order faxed Additional Follow-up by: DeShannon Katrinka Blazing CMA Duncan Dull),  May 23, 2009 11:35 AM

## 2010-06-05 NOTE — Assessment & Plan Note (Signed)
Summary: 1 MTH FU  STC--PER PT RS  STC   Vital Signs:  Patient profile:   40 year old female Height:      67 inches (170.18 cm) Weight:      176.25 pounds (80.11 kg) BMI:     27.70 O2 Sat:      98 % on Room air Temp:     97.5 degrees F (36.39 degrees C) oral Pulse rate:   93 / minute BP sitting:   128 / 90  (left arm) Cuff size:   large  Vitals Entered By: Josph Macho RMA (August 02, 2009 11:07 AM)  O2 Flow:  Room air CC: 1 month follow up/ CF Is Patient Diabetic? No   Referring Provider:  Cindee Salt MD Primary Provider:  Cindee Salt MD  CC:  1 month follow up/ CF.  History of Present Illness: the status of at least 3 ongoing medical problems is addressed today: hyperthyroidism:  pt had i-131 rx approx 6 weeks ago.  she has improvement in palpitations. she says she is still at risk for pregnancy.  she no longer takes b-blocker. she takes tenex as rx'ed for htn.  no anxiety or depression, but she has insomina.  Current Medications (verified): 1)  Maxzide-25 37.5-25 Mg Tabs (Triamterene-Hctz) .... Take One By Mouth Daily 2)  Aspirin 81 Mg Tbec (Aspirin) .... Take 1 Tablet By Mouth Once A Day 3)  Omeprazole 20 Mg  Tbec (Omeprazole) .Marland Kitchen.. 1 Daily As Needed For Heartburn 4)  Guanfacine Hcl 1 Mg Tabs (Guanfacine Hcl) .Marland Kitchen.. 1 Qhs  Allergies (verified): No Known Drug Allergies  Past History:  Past Medical History: Last updated: 10/18/2008 Hypertension GERD Uterine fibroids  Social History: Reviewed history from 07/22/2006 and no changes required. 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         she has few lbs weight gain.  Physical Exam  General:  normal appearance.   Neck:  thyroid is minimally enlarged, with an irregular surface.  no nodule. Additional Exam:  FastTSH      0.02         0.35-5.50 Free T4       1.0 ng/dL     Impression &  Recommendations:  Problem # 1:  HYPERTHYROIDISM (ICD-242.90) Assessment Improved  Problem # 2:  HYPERTENSION (ICD-401.9) adeq control  Problem # 3:  insomnia could be exac by #1  Problem # 4:  at risk for pregnancy i'll avoid b-blocker as best i can  Medications Added to Medication List This Visit: 1)  Tsh and Free T4  .... 242.9.  please do late april 2011  Other Orders: TLB-TSH (Thyroid Stimulating Hormone) (84443-TSH) TLB-T4 (Thyrox), Free [phone]) Est. Patient Level IV ([address])  Patient Instructions: 1)  tests are being ordered for you today.  a few days after the test(s), please call [phone] to hear your test results. 2)  pending the test results, please continue the same medications for now. 3)  Please schedule a follow-up appointment in 5 weeks with labs prior ([facility]) 4)  (update: i left message on phone-tree:  rx as we discussed) Prescriptions: TSH AND FREE T4 242.9.  please do late april 2011  #0 x 0   Entered and Authorized by:   Minus Breeding MD   Signed by:   Minus Breeding MD on 08/02/2009   Method used:   Print then Give to Patient  RxID:   [id number]

## 2010-06-05 NOTE — Miscellaneous (Signed)
Summary: Orders Update  Clinical Lists Changes  Orders: Added new Referral order of Gynecologic Referral (Gyn) - Signed 

## 2010-06-05 NOTE — Assessment & Plan Note (Signed)
Summary: yeast infection/alc   Vital Signs:  Patient profile:   40 year old female Height:      67 inches Weight:      173.13 pounds BMI:     27.21 Temp:     99.0 degrees F oral Pulse rate:   80 / minute Pulse rhythm:   regular BP sitting:   120 / 88  (left arm) Cuff size:   regular  Vitals Entered By: Linde Gillis CMA Duncan Dull) (December 10, 2009 8:09 AM) CC: ? yeast infection   History of Present Illness: 40 yo here for ?yeast infection.  Has had one week of vaginal discharge, itching and burning. She is not concerned about STDs, does not want to be tested.  Tried OTC monistat with no relief of symptoms. No recent abx. Not a diabetic.  Has had a history of yeast infections in past.   No fevers, abdominal pain, nausea, or vomiting.  Allergies (verified): No Known Drug Allergies  Review of Systems      See HPI General:  Denies fever. GI:  Denies abdominal pain, nausea, and vomiting. GU:  Complains of discharge; denies abnormal vaginal bleeding, dysuria, urinary frequency, and urinary hesitancy.  Physical Exam  General:  alert and normal appearance.   Genitalia:  normal introitus, no external lesions, normal uterus size and position, no adnexal masses or tenderness, and vaginal discharge.   Psych:  normally interactive, good eye contact, not anxious appearing, and not depressed appearing.     Impression & Recommendations:  Problem # 1:  VAGINAL DISCHARGE (ICD-623.5) Assessment New Wet prep consistent with yeast. Treat with diflucan 150 mg by mouth x 1. Orders: Wet Prep (09811BJ)  Complete Medication List: 1)  Maxzide-25 37.5-25 Mg Tabs (Triamterene-hctz) .... Take one by mouth daily 2)  Aspirin 81 Mg Tbec (Aspirin) .... Take 1 tablet by mouth once a day 3)  Omeprazole 20 Mg Tbec (Omeprazole) .Marland Kitchen.. 1 daily as needed for heartburn 4)  Guanfacine Hcl 1 Mg Tabs (Guanfacine hcl) .Marland Kitchen.. 1 at bedtime 5)  Tsh  .... 242.9.  please do late sept 2011 6)  Levothyroxine  Sodium 100 Mcg Tabs (Levothyroxine sodium) .Marland Kitchen.. 1 once daily 7)  Diflucan 150 Mg Tabs (Fluconazole) .... Once daily Prescriptions: DIFLUCAN 150 MG TABS (FLUCONAZOLE) once daily  #1 x 0   Entered and Authorized by:   Ruthe Mannan MD   Signed by:   Ruthe Mannan MD on 12/10/2009   Method used:   Electronically to        [facility] (retail)       [address]       [place], [place]  [address]       Ph: [phone]       Fax: [phone]   RxID:   [id number]   Current Allergies (reviewed today): No known allergies   Laboratory Results    Wet [place]/KOH Source: vaginal Bacteria/hpf rare Yeast/hpf many KOH Negative Trichomonas/hpf none

## 2010-06-05 NOTE — Assessment & Plan Note (Signed)
Summary: ROA 6 MTHS CYD   Vital Signs:  Patient profile:   40 year old female Weight:      175 pounds Temp:     98.4 degrees F oral Pulse rate:   68 / minute Pulse rhythm:   regular Resp:     12 per minute BP sitting:   130 / 94  (left arm) Cuff size:   regular  Vitals Entered By: Lowella Petties CMA (November 02, 2009 10:51 AM) CC: 6 month follow up   History of Present Illness: Doing well recovered from the pneumonia slight cough No SOB No exercise though---no change in tolerance for exertion though  Thyroid med recently increased feels tired when getting off work some days---non specific  Doesn't check BP Took med late today--may be reason for mild elevation No headaches slight chest pain this week---"like a cold" --on right side No fever No edema  Allergies: No Known Drug Allergies  Past History:  Past medical, surgical, family and social histories (including risk factors) reviewed, and no changes noted (except as noted below).  Past Medical History: Hypertension GERD Uterine fibroids Hypothyroidism  (following radioactive iodine Rx)  Past Surgical History: D&C  2000 SVD X 1 1989 2011 Radioactive iodine Rx for Grave's disease  Family History: Reviewed history from 06/07/2009 and no changes required. Dad  died @74 --CHF---CAD(CABG), DM, HTN Mom in 60's--HTN Pat GF died o lung cancer Mat GM died of ?intestinal cancer/pulm. embolus 1 sister with cerebral palsy, HTN Sister has arthritis unceratin if any thyroid probs.  Social History: Reviewed history from 07/22/2006 and no changes required. 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   Neck:  supple and no masses.   Chest Wall:  no deformities and no tenderness.   Lungs:  no intercostal retractions, no accessory muscle use, normal  breath sounds, no crackles, and no wheezes.   Heart:  normal rate, regular rhythm, no murmur, and no gallop.   Abdomen:  soft and non-tender.   Extremities:  no edema Psych:  normally interactive, good eye contact, not anxious appearing, and not depressed appearing.     Impression & Recommendations:  Problem # 1:  HYPERTENSION (ICD-401.9) Assessment Unchanged late on med today BP probably fine no changes  Her updated medication list for this problem includes:    Maxzide-25 37.5-25 Mg Tabs (Triamterene-hctz) .Marland Kitchen... Take one by mouth daily    Guanfacine Hcl 1 Mg Tabs (Guanfacine hcl) .Marland Kitchen... 1 at bedtime  BP today: 130/94 Prior BP: 102/80 (10/23/2009)  Labs Reviewed: K+: 3.6 (04/24/2009) Creat: : 0.8 (04/24/2009)     Problem # 2:  BRONCHOPNEUMONIA ORGANISM UNSPECIFIED (ICD-485) Assessment: Improved clinically resolved will await radiologist confirmation that CXR back to normal Orders: T-2 View CXR (71020TC)  Problem # 3:  HYPOTHYROIDISM, POST-RADIATION (ICD-244.1) Assessment: Comment Only Dr Everardo All managing  Her updated medication list for this problem includes:    Levothyroxine Sodium 125 Mcg Tabs (Levothyroxine sodium) .Marland Kitchen... 1 once daily  Problem # 4:  GERD (ICD-530.81) Assessment: Unchanged okay on the med Her updated medication list for this problem includes:    Omeprazole 20 Mg Tbec (Omeprazole) .Marland Kitchen... 1 daily as needed for heartburn  Complete Medication List: 1)  Maxzide-25 37.5-25 Mg Tabs (Triamterene-hctz) .... Take one by mouth daily 2)  Aspirin 81 Mg Tbec (Aspirin) .... Take  1 tablet by mouth once a day 3)  Omeprazole 20 Mg Tbec (Omeprazole) .Marland Kitchen.. 1 daily as needed for heartburn 4)  Guanfacine Hcl 1 Mg Tabs (Guanfacine hcl) .Marland Kitchen.. 1 at bedtime 5)  Tsh and Free T4  .... 242.9.  please do late july, or early aug 2011 6)  Levothyroxine Sodium 125 Mcg Tabs (Levothyroxine sodium) .Marland Kitchen.. 1 once daily  Patient Instructions: 1)  Please schedule a follow-up appointment  in 6 months for physical

## 2010-06-05 NOTE — Letter (Signed)
Summary: Out of Work  [employer] at [facility]  [address] [place], [place] [address]   Phone: [phone]  Fax: [phone]    August 10, 2009   Employee:  STEPH CHEADLE    To Whom It May Concern:   For Medical reasons, please excuse the above named employee from work for the following dates:  Start:   08/08/2009  End:   08/14/2009 patient may return to work then, ONLY if on her 08/13/2009 appt goes well.  If you need additional information, please feel free to contact our office.         Sincerely,      Tillman Abide, MD

## 2010-06-05 NOTE — Progress Notes (Signed)
Summary: Referral  Phone Note Call from Patient Call back at Home Phone [phone]   Caller: Patient Summary of Call: pt called requesting to have Iodine treatment done at [facility] on a Tuesday or Wednesday if possible Initial call taken by: Margaret Pyle, CMA,  June 11, 2009 9:38 AM  Follow-up for Phone Call        i forwarded message to pcc Follow-up by: Minus Breeding MD,  June 11, 2009 10:13 AM  Additional Follow-up for Phone Call Additional follow up Details #1::        Patient notified [facility] will call her with an appt. Additional Follow-up by: Lucious Groves,  June 11, 2009 3:29 PM    Additional Follow-up for Phone Call Additional follow up Details #2::    called [facility]  [phone]. appt scheduled for Feb 15,2011@ 8:00 appt time arrival time 7:30 - [facility] Shelbie Proctor  June 12, 2009 9:45 AM pt has been informed Shelbie Proctor  June 12, 2009 9:49 A Follow-up by: Shelbie Proctor,  June 14, 2009 11:11 AM

## 2010-06-06 NOTE — Letter (Signed)
Summary: Work Dietitian at [facility]  [address] [place], [place] [address]   Phone: [phone]  Fax: [phone]    Today's Date: May 22, 2010  Name of Patient: Kristina Gardner  The above named patient had a medical visit today at:  05/22/2010  Please take this into consideration when reviewing the time away from work/school.    Special Instructions:  Arly.Keller  ] None  [  ] To be off the remainder of today, returning to the normal work / school schedule tomorrow.  [  ] To be off until the next scheduled appointment on ______________________.  [  ] Other ________________________________________________________________ ________________________________________________________________________   Sincerely yours,      Tillman Abide, MD

## 2010-06-06 NOTE — Assessment & Plan Note (Signed)
Summary: cpx/jrr  R/S FROM 05/07/10   Vital Signs:  Patient profile:   40 year old female Weight:      183 pounds Temp:     98.7 degrees F oral Pulse rate:   103 / minute Pulse rhythm:   regular BP sitting:   160 / 112  (left arm) Cuff size:   regular  Vitals Entered By: Mervin Hack CMA Duncan Dull) (May 22, 2010 8:55 AM)  Serial Vital Signs/Assessments:  Time      Position  BP       Pulse  Resp  Temp     By           R Arm     156/112                        Cindee Salt MD  CC: adult physical   History of Present Illness: Has a cold now--everybody at work is sick feels okay at home but too cold at work (heat not right) Slight cough---occ mucus No fever No SOB Cold started about a month ago----seems to improve and then worsens again Alka seltzer last night  Hasn't been checking BP Concerned about thyroid Only occ gets palpitations now--still on the guafacine  Lots of stress Boyfriend and 2 children moved in with her Her daughter moved out The thyroid problem, etc  Allergies: No Known Drug Allergies  Past History:  Past medical, surgical, family and social histories (including risk factors) reviewed for relevance to current acute and chronic problems.  Past Medical History: Reviewed history from 11/02/2009 and no changes required. Hypertension GERD Uterine fibroids Hypothyroidism  (following radioactive iodine Rx)  Past Surgical History: Reviewed history from 11/02/2009 and no changes required. D&C  2000 SVD X 1 1989 2011 Radioactive iodine Rx for Grave's disease  Family History: Reviewed history from 06/07/2009 and no changes required. Dad  died @74 --CHF---CAD(CABG), DM, HTN Mom in 60's--HTN Pat GF died o lung cancer Mat GM died of ?intestinal cancer/pulm. embolus 1 sister with cerebral palsy, HTN Sister has arthritis unceratin if any thyroid probs.  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 He has 2 children living there  Never Smoked Alcohol use-yes  Review of Systems General:  No exercise weight is up some--- 14# since the summer Generally sleeps okay wears seat belt  . Eyes:  Denies double vision and vision loss-1 eye. ENT:  Denies decreased hearing and ringing in ears; teeth okay--regular with dentist. CV:  Complains of lightheadness and palpitations; denies chest pain or discomfort, difficulty breathing at night, difficulty breathing while lying down, fainting, and shortness of breath with exertion; slight dizziness when blowing nose now with cold. Resp:  Complains of cough; denies shortness of breath. GI:  Complains of constipation and indigestion; denies abdominal pain, bloody stools, dark tarry stools, nausea, and vomiting; tends to go once a week some heartburn--uses prilosec as needed . GU:  Denies dysuria and incontinence; no sexual problems uses condoms. MS:  Denies joint pain and joint swelling. Derm:  Denies lesion(s) and rash. Neuro:  Denies headaches, numbness, tingling, and weakness. Psych:  Complains of depression; denies anxiety; does tend to cry more with her periods---more emotional but no persistent depressed. Heme:  Denies abnormal bruising and enlarge lymph nodes. Allergy:  Denies seasonal allergies and sneezing.  Physical Exam  General:  alert and normal appearance.   Eyes:  pupils equal, pupils round, pupils  reactive to light, and no optic disk abnormalities.   Ears:  R ear normal and L ear normal.   Mouth:  no erythema, no exudates, and no lesions.   Neck:  supple, no masses, no thyromegaly, no carotid bruits, and no cervical lymphadenopathy.   Breasts:  no abnormal thickening, no tenderness, and no adenopathy.  Bilateral cystic changes Lungs:  normal respiratory effort, no intercostal retractions, no accessory muscle use, and normal breath sounds.   Heart:  normal rate, regular rhythm, no murmur, and no gallop.     Abdomen:  soft and non-tender.   Palpable uterus with fibroid in suprapubic area Msk:  no joint tenderness and no joint swelling.   Extremities:  no edema Neurologic:  alert & oriented X3, strength normal in all extremities, and gait normal.   Skin:  no rashes and no suspicious lesions.   Psych:  normally interactive, good eye contact, not anxious appearing, and not depressed appearing.     Impression & Recommendations:  Problem # 1:  PREVENTIVE HEALTH CARE (ICD-V70.0) Assessment Comment Only discussed mammo and will continue yearly till menopause discussed fitness  Problem # 2:  HYPERTENSION (ICD-401.9) Assessment: Deteriorated  up probably due to cold meds had been fine no changes  The following medications were removed from the medication list:    Guanfacine Hcl 1 Mg Tabs (Guanfacine hcl) .Marland Kitchen... 1 at bedtime Her updated medication list for this problem includes:    Maxzide-25 37.5-25 Mg Tabs (Triamterene-hctz) .Marland Kitchen... Take one by mouth daily  BP today: 160/112 Prior BP: 120/82 (12/14/2009)  Labs Reviewed: K+: 3.6 (04/24/2009) Creat: : 0.8 (04/24/2009)     Orders: TLB-Renal Function Panel (80069-RENAL) TLB-CBC Platelet - w/Differential (85025-CBCD) TLB-Hepatic/Liver Function Pnl (80076-HEPATIC)  Problem # 3:  URI (ICD-465.9) Assessment: New persistent symptoms for a month doesn't seem like bacterial infection would try antibiotic if worsens  Problem # 4:  HYPOTHYROIDISM, POST-RADIATION (ICD-244.1) Assessment: Unchanged  willl check labs  Her updated medication list for this problem includes:    Levothyroxine Sodium 112 Mcg Tabs (Levothyroxine sodium) .Marland Kitchen... 1 tab once daily  Orders: TLB-T4 (Thyrox), Free [phone]) TLB-TSH (Thyroid Stimulating Hormone) (84443-TSH) Venipuncture (95621)  Problem # 5:  FIBROIDS, UTERUS (ICD-218.9) Assessment: Improved IUD helps symptoms but still large May want to consider embolization   Complete Medication List: 1)   Maxzide-25 37.5-25 Mg Tabs (Triamterene-hctz) .... Take one by mouth daily 2)  Levothyroxine Sodium 112 Mcg Tabs (Levothyroxine sodium) .Marland Kitchen.. 1 tab once daily 3)  Aspirin 81 Mg Tbec (Aspirin) .... Take 1 tablet by mouth once a day 4)  Omeprazole 20 Mg Tbec (Omeprazole) .Marland Kitchen.. 1 daily as needed for heartburn 5)  Senokot S 8.6-50 Mg Tabs (Sennosides-docusate sodium) .... 2 tabs daily to prevent constipation  Other Orders: Radiology Referral (Radiology)  Patient Instructions: 1)  Please schedule a follow-up appointment in 6 months .  2)  Schedule your mammogram.    Orders Added: 1)  Est. Patient 40-64 years [99396] 2)  TLB-T4 (Thyrox), Free [phone] 3)  TLB-TSH (Thyroid Stimulating Hormone) [84443-TSH] 4)  Venipuncture [36415] 5)  TLB-Renal Function Panel [80069-RENAL] 6)  TLB-CBC Platelet - w/Differential [85025-CBCD] 7)  TLB-Hepatic/Liver Function Pnl [80076-HEPATIC] 8)  Radiology Referral [Radiology]    Current Allergies (reviewed today): No known allergies

## 2010-06-12 NOTE — Progress Notes (Signed)
Summary: not feeling any better  Phone Note Call from Patient Call back at Home Phone [phone]   Summary of Call: Patient was seen on 1-18 , she  says that she was told if didn't start feeling better that you would call in anitbiotic.She says that she has alot of congestion, cough.  She is asking if she could have something called in to [facility].  Initial call taken by: Melody Comas,  June 03, 2010 2:21 PM  Follow-up for Phone Call        Okay amoxicillin 500mg  2 by mouth two times a day  #40 x 0 Follow-up by: Cindee Salt MD,  June 03, 2010 2:29 PM  Additional Follow-up for Phone Call Additional follow up Details #1::        Spoke with patient and advised rx sent to pharmacy Additional Follow-up by: DeShannon Katrinka Blazing CMA Duncan Dull),  June 03, 2010 2:57 PM    New/Updated Medications: AMOXICILLIN 500 MG CAPS (AMOXICILLIN) 2 by mouth two times a day Prescriptions: AMOXICILLIN 500 MG CAPS (AMOXICILLIN) 2 by mouth two times a day  #10 x 0   Entered by:   Mervin Hack CMA (AAMA)   Authorized by:   Cindee Salt MD   Signed by:   Mervin Hack CMA (AAMA) on 06/03/2010   Method used:   Electronically to        [facility] (retail)       [address]       [place], [place]  [address]       Ph: [phone]       Fax: [phone]   RxID:   [id number]

## 2010-06-20 ENCOUNTER — Encounter: Payer: Self-pay | Admitting: Internal Medicine

## 2010-06-20 ENCOUNTER — Ambulatory Visit: Payer: Self-pay | Admitting: Internal Medicine

## 2010-06-21 ENCOUNTER — Encounter: Payer: Self-pay | Admitting: Internal Medicine

## 2010-06-21 LAB — HM MAMMOGRAPHY: HM Mammogram: NORMAL

## 2010-06-26 NOTE — Letter (Signed)
Summary: Results Follow up Letter  [facility]  [address] [place], [place] [address]   Phone: [phone]  Fax: [phone]    06/21/2010 MRN: [id number]  [facility] [address] [place], [place]  [address]  Dear Ms. Andon,  The following are the results of your recent test(s):  Test         Result    Pap Smear:        Normal _____  Not Normal _____ Comments: ______________________________________________________ Cholesterol: LDL(Bad cholesterol):         Your goal is less than:         HDL (Good cholesterol):       Your goal is more than: Comments:  ______________________________________________________ Mammogram:        Normal __X___  Not Normal _____ Comments:mammo looks fine repeat recommended in 1 year ___________________________________________________________________ Hemoccult:        Normal _____  Not normal _______ Comments:    _____________________________________________________________________ Other Tests:    We routinely do not discuss normal results over the telephone.  If you desire a copy of the results, or you have any questions about this information we can discuss them at your next office visit.   Sincerely,      Tillman Abide, MD

## 2010-07-31 ENCOUNTER — Other Ambulatory Visit: Payer: Self-pay | Admitting: *Deleted

## 2010-07-31 MED ORDER — LEVOTHYROXINE SODIUM 112 MCG PO TABS: 112.0000 ug | ORAL_TABLET | Freq: Every day | ORAL | Status: DC

## 2010-07-31 MED ORDER — TRIAMTERENE-HCTZ 37.5-25 MG PO TABS: 1.0000 | ORAL_TABLET | Freq: Every day | ORAL | Status: DC

## 2010-08-05 ENCOUNTER — Ambulatory Visit: Payer: Self-pay | Admitting: Obstetrics and Gynecology

## 2010-08-05 ENCOUNTER — Other Ambulatory Visit: Payer: Self-pay | Admitting: Obstetrics and Gynecology

## 2010-08-05 DIAGNOSIS — D219 Benign neoplasm of connective and other soft tissue, unspecified: Secondary | ICD-10-CM

## 2010-08-05 DIAGNOSIS — Z124 Encounter for screening for malignant neoplasm of cervix: Secondary | ICD-10-CM

## 2010-08-05 DIAGNOSIS — D259 Leiomyoma of uterus, unspecified: Secondary | ICD-10-CM

## 2010-08-05 DIAGNOSIS — Z01419 Encounter for gynecological examination (general) (routine) without abnormal findings: Secondary | ICD-10-CM

## 2010-08-06 NOTE — Assessment & Plan Note (Unsigned)
NAMEMarland Kitchen  ESSYNCE, MUNSCH NO.:  [id number]  MEDICAL RECORD NO.:  [id number]           PATIENT TYPE:  LOCATION:  [facility] at [facility]           FACILITY:  PHYSICIAN:  Argentina Donovan, MD        DATE OF BIRTH:  1970/05/20  DATE OF SERVICE:  08/05/2010                                 CLINIC NOTE  The patient is a 40 year old African American female with significant fibroids.  She had a complete physical prior to her last visit and was in today because since she has a yeast infection and did need a Pap smear.  On examination, her abdomen is soft with a palpable uterus that comes almost up to the umbilicus and fills the pelvis.  She has a Mirena IUD. On examination, the external genitalia is normal.  BUS within normal limits.  The vagina is clean and tends to have a lot of redundant wall tissue.  The cervix is clean, parous and Pap smear was taken as well as a wet prep.  The string of the IUD was easily visualized.  The bimanual exam, the uterus completely filled the pelvis.  She had a recent ultrasound.  We do not have a copy of that.  It apparently went to her family doctor and her last one was in 2009.  At that time, the uterus measured 14 x 6 x 7.  I think that had probably has grown since then. She want to know what her options were and so I talked to her about myomectomy reasons pro and con for that hysterectomy with or without Depo-Lupron and uterine artery embolization.  She really said she is not ready to have a hysterectomy and take up not that much time from work, so I am making an appointment for her to see interventional radiologist at [facility].  We are going to try and get her results of her last ultrasound from her family doctor and we will treat her wet prep results when they come back, let her know about the Pap smear.  IMPRESSION:  Symptomatic uterine fibroids.          ______________________________ Argentina Donovan, MD    PR/MEDQ  D:  08/05/2010  T:   08/06/2010  Job:  [id number]

## 2010-08-13 ENCOUNTER — Other Ambulatory Visit: Payer: Self-pay

## 2010-08-14 ENCOUNTER — Ambulatory Visit
Admission: RE | Admit: 2010-08-14 | Discharge: 2010-08-14 | Disposition: A | Discharge status: Home / Self Care | Payer: 59 | Source: Ambulatory Visit | Attending: Obstetrics and Gynecology | Admitting: Obstetrics and Gynecology

## 2010-08-14 ENCOUNTER — Other Ambulatory Visit: Payer: Self-pay | Admitting: Obstetrics and Gynecology

## 2010-08-14 VITALS — BP 138/100 | HR 85 | Temp 98.0°F | Resp 14 | Ht 67.0 in | Wt 175.0 lb

## 2010-08-14 DIAGNOSIS — D219 Benign neoplasm of connective and other soft tissue, unspecified: Secondary | ICD-10-CM

## 2010-08-14 DIAGNOSIS — N920 Excessive and frequent menstruation with regular cycle: Secondary | ICD-10-CM

## 2010-08-14 HISTORY — DX: Essential (primary) hypertension: I10

## 2010-08-14 HISTORY — DX: Pneumonia, unspecified organism: J18.9

## 2010-08-24 ENCOUNTER — Ambulatory Visit
Admission: RE | Admit: 2010-08-24 | Discharge: 2010-08-24 | Disposition: A | Discharge status: Home / Self Care | Payer: 59 | Source: Ambulatory Visit | Attending: Obstetrics and Gynecology | Admitting: Obstetrics and Gynecology

## 2010-08-24 DIAGNOSIS — N920 Excessive and frequent menstruation with regular cycle: Secondary | ICD-10-CM

## 2010-08-24 DIAGNOSIS — D219 Benign neoplasm of connective and other soft tissue, unspecified: Secondary | ICD-10-CM

## 2010-08-24 MED ORDER — GADOBENATE DIMEGLUMINE 529 MG/ML IV SOLN
15.0000 mL | Freq: Once | INTRAVENOUS | Status: AC | PRN
  Administered 2010-08-24: 15 mL via INTRAVENOUS

## 2010-09-05 ENCOUNTER — Other Ambulatory Visit: Payer: Self-pay | Admitting: *Deleted

## 2010-09-05 MED ORDER — TRIAMTERENE-HCTZ 37.5-25 MG PO TABS: 1.0000 | ORAL_TABLET | Freq: Every day | ORAL | Status: DC

## 2010-09-05 MED ORDER — LEVOTHYROXINE SODIUM 112 MCG PO TABS: 112.0000 ug | ORAL_TABLET | Freq: Every day | ORAL | Status: DC

## 2010-09-05 NOTE — Telephone Encounter (Signed)
[facility] is asking for new scripts for levothyroxine and maxzide, faxed forms are on your desk.

## 2010-09-17 ENCOUNTER — Other Ambulatory Visit (INDEPENDENT_AMBULATORY_CARE_PROVIDER_SITE_OTHER): Payer: 59 | Admitting: Obstetrics & Gynecology

## 2010-09-17 DIAGNOSIS — N920 Excessive and frequent menstruation with regular cycle: Secondary | ICD-10-CM

## 2010-09-17 NOTE — Assessment & Plan Note (Signed)
NAMEVERNIDA, MCNICHOLAS NO.:  [id number]   MEDICAL RECORD NO.:  [id number]          PATIENT TYPE:  POB   LOCATION:  [facility] at [facility]         FACILITY:  [facility]   PHYSICIAN:  Allie Bossier, MD        DATE OF BIRTH:  Apr 09, 1971   DATE OF SERVICE:  01/31/2010                                  CLINIC NOTE   Ms. Manny is a 40 year old gravida 1, para 1 who was seen by Dr.  Shawnie Pons on January 01, 2010, for an IUD insertion.  The IUD insertion was  done as a conservative therapy for her painful fibroid uterus.  She says  that the pain that she previously associated with her fibroids has  essentially completely resolved.  She did have about 3 days of  postinsertion pain that is now completely resolved.  Her only complaint  is that of a brownish discharge on occasion.  I explained to her that  that is entirely normal and on my exam, I see her strings, I see no  abnormal discharge.   ASSESSMENT AND PLAN:  Status post intrauterine device insertion for  relief of pelvic pain from fibroids, this seems to be a complete  success.  She is happy with this and will continue to get her Pap smears  done at her primary care doctor.      Allie Bossier, MD     MCD/MEDQ  D:  01/31/2010  T:  01/31/2010  Job:  [id number]

## 2010-09-17 NOTE — Assessment & Plan Note (Signed)
[name], Gardner NO.:  [id number]   MEDICAL RECORD NO.:  [id number]          PATIENT TYPE:  POB   LOCATION:  [facility] at [facility]         FACILITY:  [facility]   PHYSICIAN:  Tinnie Gens, MD        DATE OF BIRTH:  02/26/1971   DATE OF SERVICE:  01/01/2010                                  CLINIC NOTE   CHIEF COMPLAINT:  Followup.   HISTORY OF PRESENT ILLNESS:  The patient is a 40 year old gravida 1,  para 1 who was seen by Dr. Adine Madura in June of this year for fibroid  uterus.  At that time, she was noted to have 14-week size uterus with  large fibroids.  The patient reports increasing pain and bleeding.  She  was given options including hormone therapy, myomectomy, and  hysterectomy and a pamphlet on hysterectomy was given to the patient.  The patient did not really want hysterectomy.  This does not really fit  in with her job at this time.  She is interested in other options.   PHYSICAL EXAMINATION:  ABDOMEN:  Today, her uterus is very large and  firm, it feels at the lower abdomen.  GU:  Normal external female genitalia.  BUS is normal.  Vagina is pink  and rugated.  Cervix is parous without lesion.  Uterus is very firm,  markedly enlarged, approximately 20 weeks' size with a large anterior  and posterior fibroids.   IMPRESSION:  Fibroid uterus.  A lengthy discussion was held with the  patient regarding alternatives to treatment including oral  contraceptives which she was uninterested in because of nausea, Depo-  Provera which she is uninterested in secondary to side effects, Mirena  intrauterine device.  MRI guided ultrasound ablation done by [facility]  Interventional Radiology and uterine artery embolization.  I felt the  patient has already been counseled regarding myomectomy and  hysterectomy.  She is unsure in that plan of choice.  The patient  desired Mirena intrauterine device insertion to be done today.   PROCEDURE:  The patient was then placed on  dorsal lithotomy.  The cervix  was grasped with single-tooth tenaculum anteriorly, it sounded to  approximately 8 cm.  Mirena IUD was inserted without difficulty.  Strings were trimmed to 3 cm.  The patient is given ibuprofen.  She  tolerated the procedure well.   PLAN:  The patient will follow up in 4 weeks to see how this has worked  for her.  I did discuss chance of spotting and decreasing pain at it  might relate to IUD insertion.  Hopefully, this conservative therapy  will work for her.  However, given the mechanical nature of fibroids,  this may not be sufficient treatment in which case, she would need  further ablative therapy, which she can thing about and will be  discussed at a later visit.           ______________________________  Tinnie Gens, MD     TP/MEDQ  D:  01/01/2010  T:  01/02/2010  Job:  [id number]

## 2010-09-17 NOTE — Assessment & Plan Note (Signed)
NAMEREAGYN, FACEMIRE NO.:  [id number]   MEDICAL RECORD NO.:  [id number]          PATIENT TYPE:  POB   LOCATION:  [facility] at [facility]         FACILITY:  [facility]   PHYSICIAN:  Allie Bossier, MD        DATE OF BIRTH:  Apr 06, 1971   DATE OF SERVICE:  04/03/2010                                  CLINIC NOTE   Kristina Gardner is a 40 year old G1, P1 who has an IUD placed earlier  this year for relief of her fibroids symptoms.  She says that her  symptoms have totally resolved with regard to fibroid; however, she has  had a brownish discharge since the insertion.  I have explained that  this is normal.  She also has had some itching and some white discharge  as well.  She has not used any over-the-counter medicines. On speculum  exam, there is a discharge clinging to the vaginal sidewalls that would  be consistent with yeast.  There is some old brown blood in the vault as  well.  I have sent the claggy discharge to the lab for wet prep, these  results will be available tomorrow.  We will treat accordingly.      Allie Bossier, MD     MCD/MEDQ  D:  04/03/2010  T:  04/03/2010  Job:  [id number]

## 2010-09-17 NOTE — Assessment & Plan Note (Signed)
Kristina Gardner, BATTY NO.:  [id number]   MEDICAL RECORD NO.:  [id number]          PATIENT TYPE:  POB   LOCATION:  [facility] at [facility]         FACILITY:  [facility]   PHYSICIAN:  Scheryl Darter, MD       DATE OF BIRTH:  1971/04/01   DATE OF SERVICE:                                  CLINIC NOTE   CHIEF COMPLAINT:  Heavy period and lump in the lower abdomen.   HISTORY OF PRESENT ILLNESS:  The patient is a 40 year old black female,  gravida 1, para 1, last menstrual period October 20, 2008, which lasts  about 6 days.  She has heavy flow for the first 1 or 2 days.  She also  has some cramps and sensation of a lump in her lower abdomen.  She was  seen by Dr. Alphonsus Sias at [facility] in [place].  Ultrasound was  ordered due to her symptoms and leiomyomatous uterus was described.  The  uterine dimensions were 14.4 x 6.2 x 7.4 cm with 7 uterine leiomyomas  identified, the largest measured 7.2 cm.  There were prominent  predominantly intramural in location with extensive distortion of the  endometrium.  Right ovary contained a 18-mm follicle left ovary, was  described as normal.   PAST MEDICAL HISTORY:  1. Uterine polyps.  2. Abnormal Pap smear.  3. Hypertension.   MEDICATIONS:  1. Triamterene/hydrochlorothiazide 37.5/12.5 one a day.  2. Baby aspirin a day.   ALLERGIES:  No known drug allergies.   PAST SURGICAL HISTORY:  Cervical cryotherapy in 1995 and dilatation and  curettage in 2003 performed by Dr. Tiburcio Pea in [place] in the past.   OBSTETRICAL HISTORY:  The patient had a vaginal delivery 21 years ago  without complication.   FAMILY HISTORY:  Diabetes, hypertension, coronary heart disease.   SOCIAL HISTORY:  The patient is single and she denies tobacco use.  She  occasionally drinks alcohol and she denies any drug use.   REVIEW OF SYSTEMS:  The patient states that she has some symptoms  consistent with bacterial vaginosis with vaginal odor.  She was  recently  treated with metronidazole for this.   PHYSICAL EXAMINATION:  GENERAL:  The patient in no acute distress.  VITAL SIGNS:  Weight is 172 pounds, height 5 feet 6-1/2 inches, BP  111/86, pulse 89.  ABDOMEN:  Soft, nontender with palpable mass above the symphysis pubis,  which is firm and nontender.  PELVIC:  External genitalia, vagina, and cervix appeared normal.  Uterus  is somewhat irregular and firm, about 14 weeks size consistent with  fibroid uterus.  No adnexal masses were palpated.   IMPRESSION:  Symptomatic fibroid uterus.   PLAN:  We discussed options of expectant management, hormone therapy,  manage heavy periods, myomectomy, and hysterectomy.  I gave her a  pamphlet on hysterectomy.  We will check CBC today.  She will return in  about 4 weeks period in need of result of her Pap test that was done in  January.      Scheryl Darter, MD     JA/MEDQ  D:  10/31/2008  T:  11/01/2008  Job:  [id number]

## 2010-10-09 ENCOUNTER — Other Ambulatory Visit (HOSPITAL_COMMUNITY): Payer: Self-pay | Admitting: Diagnostic Radiology

## 2010-10-09 DIAGNOSIS — D219 Benign neoplasm of connective and other soft tissue, unspecified: Secondary | ICD-10-CM

## 2010-10-28 ENCOUNTER — Ambulatory Visit (INDEPENDENT_AMBULATORY_CARE_PROVIDER_SITE_OTHER): Payer: 59 | Admitting: Obstetrics and Gynecology

## 2010-10-28 DIAGNOSIS — Z30432 Encounter for removal of intrauterine contraceptive device: Secondary | ICD-10-CM

## 2010-10-29 NOTE — Assessment & Plan Note (Unsigned)
NAME:  Kristina Gardner, Kristina Gardner NO.:  [id number]  MEDICAL RECORD NO.:  [id number]           PATIENT TYPE:  LOCATION:  [facility] at [facility]           FACILITY:  PHYSICIAN:  Catalina Antigua, MD          DATE OF BIRTH:  DATE OF SERVICE:  10/28/2010                                 CLINIC NOTE  This is a 40 year old who was gravida 1, para 1 with a history of dysfunctional uterine bleeding and fibroid uterus who presents today requesting IUD removal.  The patient is scheduled for uterine artery embolization in approximately 2 weeks and required to have her IUD removed in preparation for her surgery as per the interventional radiologist.  After informed consent was obtained, the IUD strings were visualized at the level of the external os and the IUD was removed with some difficulty.  The patient tolerated the procedure well.  The patient is planning on using condoms for birth control and is planning on having the IUD replaced as soon as she receives medical clearance from the interventional radiologist in approximately 6-8 weeks.          ______________________________ Catalina Antigua, MD    PC/MEDQ  D:  10/28/2010  T:  10/29/2010  Job:  [id number]

## 2010-11-11 ENCOUNTER — Ambulatory Visit (HOSPITAL_COMMUNITY)
Admission: RE | Admit: 2010-11-11 | Discharge: 2010-11-11 | Disposition: A | Discharge status: Home / Self Care | Payer: 59 | Source: Ambulatory Visit | Attending: Diagnostic Radiology | Admitting: Diagnostic Radiology

## 2010-11-11 DIAGNOSIS — D259 Leiomyoma of uterus, unspecified: Secondary | ICD-10-CM | POA: Insufficient documentation

## 2010-11-11 DIAGNOSIS — Z01812 Encounter for preprocedural laboratory examination: Secondary | ICD-10-CM | POA: Insufficient documentation

## 2010-11-11 LAB — CBC
HCT: 36.7 % (ref 36.0–46.0)
Hemoglobin: 10.6 g/dL — ABNORMAL LOW (ref 12.0–15.0)
MCH: 20.9 pg — ABNORMAL LOW (ref 26.0–34.0)
MCHC: 28.9 g/dL — ABNORMAL LOW (ref 30.0–36.0)
MCV: 72.4 fL — ABNORMAL LOW (ref 78.0–100.0)
RBC: 5.07 MIL/uL (ref 3.87–5.11)

## 2010-11-13 ENCOUNTER — Other Ambulatory Visit (HOSPITAL_COMMUNITY): Payer: 59

## 2010-11-15 NOTE — Assessment & Plan Note (Signed)
Kristina Gardner, Kristina Gardner NO.:  [id number]  MEDICAL RECORD NO.:  [id number]           PATIENT TYPE:  LOCATION:  [facility] at [facility]           FACILITY:  PHYSICIAN:  Jaynie Collins, MD     DATE OF BIRTH:  1970-09-05  DATE OF SERVICE:  09/17/2010                                 CLINIC NOTE  REASON FOR VISIT:  Endometrial biopsy.  The patient is a 40 year old gravida 1, para 1, who was seen by Dr. Okey Dupre on August 05, 2010, for followup for her fibroids and dysfunctional uterine bleeding.  The patient had an IUD that was placed on January 01, 2010, since then her menorrhagia and her pain have gotten a little bit better.  She does report occasional brown discharge; however, she does feel like her fibroids are getting bigger.  The patient did have a followup MRI of the pelvis on August 24, 2010, which showed a markedly enlarged uterus measuring 19.8 x 11.0 x 17.0 with the largest fibroids measuring 9.5 cm, 6.8 cm and 5.8 cm, and a right ovary with a 3.1-cm cyst.  This is significantly enlarged from her last ultrasound in 2009 which showed about a 15-week sized uterus and the largest fibroid was at that point about 7-cm.  These results were explained to the patient. She was told that given her continued irregular spotting and brown discharge an endometrial biopsy was recommended by Dr. Okey Dupre for further evaluation.  The patient is also at this point thinking of other ways to manage her fibroids.  She does not want to undergo major surgery in the form of a myomectomy or hysterectomy at this point, but is interested in the uterine artery embolization.  She already had preliminary meeting with Interventional radiology and the y told her to obtained endometrial biopsy before they consider her for this modality, as such the patient is interested in getting the results of this as soon as possible to see if she is a candidate for the uterine artery embolization.  The patient was also  counseled again about using Lupron, and the risks and benefits of Lupron were discussed with the patient.  She is not particularly interested in Depo-Lupron at this point, but she says she might consider this if she is not a candidate for uterine artery embolization.  Endometrial Biopsy:  The patient was counseled regarding risks of endometrial biopsy.  Written informed consent was obtained.  Speculum was placed in the patient's vagina.  Cervix was visualized and swabbed with Betadine.  Tenaculum was placed in the anterior lip of the cervix. IUD strings were also visualized at this point.  A 3-mm Pipelle was introduced into the fundal cavity to a depth of about 14-mm and there was immediate return of dark red blood, because of the concern of not getting adequate tissue and getting more of blood, 3 passes were made with the Pipelle and significant amount of blood and endometrial tissue was sent to pathology.  The patient tolerated procedure well.  All instruments were removed from the patient's pelvis.  She has minimal bleeding at the end of the procedure.  ASSESSMENT/PLAN:  The patient is a 40 year old gravida 1, para 1 with dysfunctional uterine bleeding, fibroid uterus, Mirena intrauterine  device in place, who is considering an uterine artery embolization at this point.  The patient is declining Depo-Lupron, hysterectomy and myomectomy.  We will follow up the results of the endometrial biopsy, it was sent as a rush specimen.  The patient was told that interventional radiologist will get a copy of her biopsy results to help make a decision if she is a candidate of uterine artery embolization if not, the patient was told to call back and we can discuss again, if she will be interested in having either Depo-Lupron, myomectomy or hysterectomy. The patient understood this plan.  Bleeding precautions were reviewed. Of note, she did have a Pap smear that was done in her last visit and we are  awaiting the results currently, and she is up-to-date on her mammogram.  The patient was told to call or come back in for any further gynecologic concerns.          ______________________________ Jaynie Collins, MD    UA/MEDQ  D:  09/17/2010  T:  09/18/2010  Job:  [id number]

## 2010-11-21 ENCOUNTER — Ambulatory Visit (HOSPITAL_COMMUNITY)
Admission: RE | Admit: 2010-11-21 | Discharge: 2010-11-22 | Disposition: A | Discharge status: Home / Self Care | Payer: 59 | Source: Ambulatory Visit | Attending: Diagnostic Radiology | Admitting: Diagnostic Radiology

## 2010-11-21 DIAGNOSIS — R5383 Other fatigue: Secondary | ICD-10-CM | POA: Insufficient documentation

## 2010-11-21 DIAGNOSIS — R5381 Other malaise: Secondary | ICD-10-CM | POA: Insufficient documentation

## 2010-11-21 DIAGNOSIS — N920 Excessive and frequent menstruation with regular cycle: Secondary | ICD-10-CM | POA: Insufficient documentation

## 2010-11-21 DIAGNOSIS — R109 Unspecified abdominal pain: Secondary | ICD-10-CM | POA: Insufficient documentation

## 2010-11-21 DIAGNOSIS — T68XXXA Hypothermia, initial encounter: Secondary | ICD-10-CM | POA: Insufficient documentation

## 2010-11-21 DIAGNOSIS — I10 Essential (primary) hypertension: Secondary | ICD-10-CM | POA: Insufficient documentation

## 2010-11-21 DIAGNOSIS — D219 Benign neoplasm of connective and other soft tissue, unspecified: Secondary | ICD-10-CM

## 2010-11-21 DIAGNOSIS — E0500 Thyrotoxicosis with diffuse goiter without thyrotoxic crisis or storm: Secondary | ICD-10-CM | POA: Insufficient documentation

## 2010-11-21 DIAGNOSIS — X31XXXA Exposure to excessive natural cold, initial encounter: Secondary | ICD-10-CM | POA: Insufficient documentation

## 2010-11-21 DIAGNOSIS — Z01812 Encounter for preprocedural laboratory examination: Secondary | ICD-10-CM | POA: Insufficient documentation

## 2010-11-21 DIAGNOSIS — R112 Nausea with vomiting, unspecified: Secondary | ICD-10-CM | POA: Insufficient documentation

## 2010-11-21 DIAGNOSIS — D649 Anemia, unspecified: Secondary | ICD-10-CM | POA: Insufficient documentation

## 2010-11-21 DIAGNOSIS — D259 Leiomyoma of uterus, unspecified: Secondary | ICD-10-CM | POA: Insufficient documentation

## 2010-11-21 LAB — HCG, SERUM, QUALITATIVE: Preg, Serum: NEGATIVE

## 2010-11-21 MED ORDER — IOHEXOL 300 MG/ML  SOLN
150.0000 mL | Freq: Once | INTRAMUSCULAR | Status: AC | PRN
  Administered 2010-11-21: 100 mL via INTRA_ARTERIAL

## 2010-11-25 ENCOUNTER — Other Ambulatory Visit: Payer: Self-pay | Admitting: Diagnostic Radiology

## 2010-11-25 DIAGNOSIS — D219 Benign neoplasm of connective and other soft tissue, unspecified: Secondary | ICD-10-CM

## 2010-12-09 ENCOUNTER — Encounter: Payer: Self-pay | Admitting: Emergency Medicine

## 2010-12-17 ENCOUNTER — Ambulatory Visit
Admission: RE | Admit: 2010-12-17 | Discharge: 2010-12-17 | Disposition: A | Discharge status: Home / Self Care | Payer: 59 | Source: Ambulatory Visit | Attending: Diagnostic Radiology | Admitting: Diagnostic Radiology

## 2010-12-17 VITALS — BP 140/93 | HR 95 | Temp 98.7°F | Resp 16 | Ht 67.0 in | Wt 170.0 lb

## 2010-12-17 DIAGNOSIS — D219 Benign neoplasm of connective and other soft tissue, unspecified: Secondary | ICD-10-CM

## 2010-12-17 NOTE — Progress Notes (Signed)
LMP:  12/05/2010.  Length x 4 days, heavy days 1 & 2.     Returned to work on 12/06/2010.   Continues to experience occasional cramping post [place].  Taking Ibuprofen prn.

## 2011-01-07 ENCOUNTER — Telehealth: Payer: Self-pay | Admitting: *Deleted

## 2011-01-07 ENCOUNTER — Other Ambulatory Visit: Payer: Self-pay | Admitting: *Deleted

## 2011-01-07 DIAGNOSIS — N760 Acute vaginitis: Secondary | ICD-10-CM

## 2011-01-07 DIAGNOSIS — B9689 Other specified bacterial agents as the cause of diseases classified elsewhere: Secondary | ICD-10-CM

## 2011-01-07 MED ORDER — METRONIDAZOLE 500 MG PO TABS: 500.0000 mg | ORAL_TABLET | Freq: Two times a day (BID) | ORAL | Status: AC

## 2011-01-07 MED ORDER — TRIAMTERENE-HCTZ 37.5-25 MG PO TABS: 1.0000 | ORAL_TABLET | Freq: Every day | ORAL | Status: DC

## 2011-01-07 NOTE — Telephone Encounter (Signed)
Patient is having symptoms of BV and is unable to get in for an appointment due to our schedule.  We will call in meds for her and she will follow up with her if she needs to.

## 2011-01-08 NOTE — Telephone Encounter (Signed)
Pt had called today about script that was sent in yesterday, she says she [facility] but [facility] about it?  [facility] but [facility] to [facility].

## 2011-02-18 ENCOUNTER — Telehealth: Payer: Self-pay | Admitting: Radiology

## 2011-02-18 ENCOUNTER — Other Ambulatory Visit: Payer: Self-pay | Admitting: Diagnostic Radiology

## 2011-02-18 DIAGNOSIS — D219 Benign neoplasm of connective and other soft tissue, unspecified: Secondary | ICD-10-CM

## 2011-02-18 NOTE — Telephone Encounter (Signed)
Pt states that LMP:  Late Sept lasting 7-8 days, heavy flow x 1-2 days.  States that her menstrual cycles have been irregular.  States that she experiences minimal pelvic discomfort most days.    Decreased bloating & decreased urinary frequency post [place].    3 mo f/u appointment scheduled:  02/25/2011 @ 1300.

## 2011-02-25 ENCOUNTER — Other Ambulatory Visit: Payer: 59

## 2011-03-20 ENCOUNTER — Encounter: Payer: Self-pay | Admitting: Internal Medicine

## 2011-03-20 ENCOUNTER — Ambulatory Visit (INDEPENDENT_AMBULATORY_CARE_PROVIDER_SITE_OTHER): Payer: 59 | Admitting: Internal Medicine

## 2011-03-20 DIAGNOSIS — J069 Acute upper respiratory infection, unspecified: Secondary | ICD-10-CM

## 2011-03-20 NOTE — Patient Instructions (Signed)
Call next week if you are worsening instead of improving

## 2011-03-20 NOTE — Progress Notes (Signed)
  Subjective:    Patient ID: Kristina Gardner, female    DOB: March 21, 1971, 40 y.o.   MRN: [id number]  HPI Has a cold again Had one and then improved, then relapsed Started 2-3 weeks ago--better for 1 week or so and then symptoms again 2 days ago May have been exposed to the flu  Had head congestion and feels achy Some headache Appetite is off No sore throat but slightly scratchy No otalgia Has cough with slight mucus  No SOB No fever No chills or sweats  Has tried cold meds--always raises her BP (alka seltzer----does help)  Current Outpatient Prescriptions on File Prior to Visit  Medication Sig Dispense Refill  . levothyroxine (SYNTHROID) 112 MCG tablet Take 1 tablet (112 mcg total) by mouth daily.  90 tablet  3  . triamterene-hydrochlorothiazide (MAXZIDE-25) 37.5-25 MG per tablet Take 1 each (1 tablet total) by mouth daily.  90 tablet  2    No Known Allergies  Past Medical History  Diagnosis Date  . Hypertension   . Pneumonia   . GERD (gastroesophageal reflux disease)   . Leiomyoma of uterus, unspecified   . Unspecified hypothyroidism     Past Surgical History  Procedure Date  . Dilation and curettage of uterus 2000    Family History  Problem Relation Age of Onset  . Hypertension Mother   . Arthritis Sister   . Cancer Paternal Grandfather     lung cancer    History   Social History  . Marital Status: Single    Spouse Name: N/A    Number of Children: 1  . Years of Education: N/A   Occupational History  . [employer], customer service    Social History Main Topics  . Smoking status: Never Smoker   . Smokeless tobacco: Never Used  . Alcohol Use: Yes  . Drug Use: No  . Sexually Active: Not on file   Other Topics Concern  . Not on file   Social History Narrative   Live in boyfriend since ~2008. He has 2 children living there    Review of Systems No nausea, vomiting or diarrhea     Objective:   Physical Exam  Constitutional: She appears  well-developed and well-nourished. No distress.  HENT:  Head: Normocephalic and atraumatic.  Right Ear: External ear normal.  Left Ear: External ear normal.  Mouth/Throat: Oropharynx is clear and moist. No oropharyngeal exudate.       Mild maxillary tenderness Mild nasal congestion  Neck: Normal range of motion. Neck supple.  Pulmonary/Chest: No respiratory distress. She has no wheezes. She has no rales.  Lymphadenopathy:    She has no cervical adenopathy.          Assessment & Plan:

## 2011-03-20 NOTE — Assessment & Plan Note (Signed)
Clearly seems to have another viral infection Discussed supportive care Non decongestant Rx discussed BP up due to the cold meds probably If worse in 4-5 days, will consider antibiotic

## 2011-04-24 ENCOUNTER — Other Ambulatory Visit: Payer: Self-pay | Admitting: Obstetrics and Gynecology

## 2011-04-24 DIAGNOSIS — D219 Benign neoplasm of connective and other soft tissue, unspecified: Secondary | ICD-10-CM

## 2011-05-08 ENCOUNTER — Telehealth: Payer: Self-pay

## 2011-05-08 DIAGNOSIS — B9689 Other specified bacterial agents as the cause of diseases classified elsewhere: Secondary | ICD-10-CM

## 2011-05-08 NOTE — Telephone Encounter (Signed)
PATIENT NEEDS SOMETHING CALLED IN FOR A BACTERIAL INFECTION PLEASE TO [facility] [address], [facility]ANKS!

## 2011-05-09 MED ORDER — METRONIDAZOLE 500 MG PO TABS: 500.0000 mg | ORAL_TABLET | Freq: Two times a day (BID) | ORAL | Status: AC

## 2011-05-09 NOTE — Telephone Encounter (Signed)
Addended by: Barbara Cower on: 05/09/2011 10:11 AM   Modules accepted: Orders

## 2011-05-21 ENCOUNTER — Encounter: Payer: Self-pay | Admitting: Radiology

## 2011-06-17 ENCOUNTER — Encounter: Payer: Self-pay | Admitting: Family Medicine

## 2011-06-17 ENCOUNTER — Ambulatory Visit (INDEPENDENT_AMBULATORY_CARE_PROVIDER_SITE_OTHER): Payer: 59 | Admitting: Family Medicine

## 2011-06-17 DIAGNOSIS — D259 Leiomyoma of uterus, unspecified: Secondary | ICD-10-CM

## 2011-06-17 DIAGNOSIS — N938 Other specified abnormal uterine and vaginal bleeding: Secondary | ICD-10-CM

## 2011-06-17 DIAGNOSIS — N949 Unspecified condition associated with female genital organs and menstrual cycle: Secondary | ICD-10-CM

## 2011-06-17 DIAGNOSIS — N920 Excessive and frequent menstruation with regular cycle: Secondary | ICD-10-CM | POA: Insufficient documentation

## 2011-06-17 NOTE — Patient Instructions (Signed)
Fibroids You have been diagnosed as having a fibroid. Fibroids are smooth muscle lumps (tumors) which can occur any place in a woman's body. They are usually in the womb (uterus). The most common problem (symptom) of fibroids is bleeding. Over time this may cause low red blood cells (anemia). Other symptoms include feelings of pressure and pain in the pelvis. The diagnosis (learning what is wrong) of fibroids is made by physical exam. Sometimes tests such as an ultrasound are used. This is helpful when fibroids are felt around the ovaries and to look for tumors. TREATMENT   Most fibroids do not need surgical or medical treatment. Sometimes a tissue sample (biopsy) of the lining of the uterus is done to rule out cancer. If there is no cancer and only a small amount of bleeding, the problem can be watched.   Hormonal treatment can improve the problem this could include progesterone, birth control pills or an IUD.   When surgery is needed, it can consist of removing the fibroid. Vaginal birth may not be possible after the removal of fibroids. This depends on where they are and the extent of surgery. When pregnancy occurs with fibroids it is usually normal.   Your caregiver can help decide which treatments are best for you.  HOME CARE INSTRUCTIONS   Do not use aspirin as this may increase bleeding problems.   If your periods (menses) are heavy, record the number of pads or tampons used per month. Bring this information to your caregiver. This can help them determine the best treatment for you.  SEEK IMMEDIATE MEDICAL CARE IF:  You have pelvic pain or cramps not controlled with medications, or experience a sudden increase in pain.   You have an increase of pelvic bleeding between and during menses.   You feel lightheaded or have fainting spells.   You develop worsening belly (abdominal) pain.  Document Released: 04/18/2000 Document Revised: 01/01/2011 Document Reviewed: 12/09/2007 [facility]  Patient Information 2012 [place], [place].

## 2011-06-17 NOTE — Assessment & Plan Note (Signed)
Trial of IUD

## 2011-06-17 NOTE — Progress Notes (Signed)
Patient is here today to discuss her heavy menses.  Her last cycle she bled for two weeks, stopped for one week and started back up Sunday of this week.  She did have a fibroid embolization in June at [facility].  She had an IUD at one point but had that removed and is interested in possibly having one put back in.  She is currently under a lot of stress at home as she recently found out her Mom has Bone Marrow cancer and has had this for four years and not told anyone.  She also now helps take care of her handicap sister, her boyfriends children and handles getting her mother back and forth to daily Dr. Algie Coffer as well as keeping up with her own job.

## 2011-06-17 NOTE — Progress Notes (Signed)
History Pt has long h/o fibroid uterus and menorrhagia.  Underwent [place] for treatment of such in 07/13.  She is back with continued bleeding.  She is now having a cycle that has lasted x 3 wks.  She remains uninterested in hysterectomy.  She had nml pap in 04/12 and nml EMB in 05/12.  Has h/o IUD in past, which helped with bleeding some.  She is interested in trying this again.  Physical exam Filed Vitals:   06/17/11 1432  BP: 149/110  Pulse: 104   She is WD/WN female. abd-soft, NT GU-nefg, BUS is nml, 14-16 wk firm uterus noted.  Large fibroid noted anteriorly.  Assessment Fibroid uterus/menorrhagia S/p [place] with continued bleeding.   Desires Mirena for attempt at control of bleeding.  Plan No IUD's in the office today.  Will attempt to place next week. Discussed that this may not result in definitive treatment and that she may require hysterectomy. Pt. Declines surgery or po megace or provera today.

## 2011-07-25 ENCOUNTER — Encounter: Payer: Self-pay | Admitting: *Deleted

## 2011-07-25 ENCOUNTER — Encounter: Payer: Self-pay | Admitting: Internal Medicine

## 2011-07-25 ENCOUNTER — Ambulatory Visit (INDEPENDENT_AMBULATORY_CARE_PROVIDER_SITE_OTHER): Payer: 59 | Admitting: Internal Medicine

## 2011-07-25 VITALS — BP 130/90 | HR 107 | Temp 98.0°F | Ht 67.0 in | Wt 185.0 lb

## 2011-07-25 DIAGNOSIS — Z0000 Encounter for general adult medical examination without abnormal findings: Secondary | ICD-10-CM | POA: Insufficient documentation

## 2011-07-25 DIAGNOSIS — D259 Leiomyoma of uterus, unspecified: Secondary | ICD-10-CM

## 2011-07-25 DIAGNOSIS — Z1231 Encounter for screening mammogram for malignant neoplasm of breast: Secondary | ICD-10-CM

## 2011-07-25 DIAGNOSIS — E890 Postprocedural hypothyroidism: Secondary | ICD-10-CM

## 2011-07-25 DIAGNOSIS — I10 Essential (primary) hypertension: Secondary | ICD-10-CM

## 2011-07-25 NOTE — Assessment & Plan Note (Signed)
BP Readings from Last 3 Encounters:  07/25/11 130/90  06/17/11 149/110  03/20/11 156/108   Control is fine now High numbers at sick visits No changes needed Check labs

## 2011-07-25 NOTE — Assessment & Plan Note (Signed)
Healthy Discussed fitness Will check mammo

## 2011-07-25 NOTE — Assessment & Plan Note (Signed)
Seeing Dr Shawnie Pons now May still need to progress to hyster

## 2011-07-25 NOTE — Assessment & Plan Note (Signed)
Due for labs

## 2011-07-25 NOTE — Progress Notes (Signed)
Subjective:    Patient ID: Kristina Gardner, female    DOB: [birth year], 41 y.o.   MRN: [id number]  HPI Here for physical Just seen by Dr Shawnie Pons for ongoing uterine problems Plans for IUD Had menorrhagia--somewhat better with the bleeding now  Lots of stress with mom with cancer Now on dialysis She is the only support for her 10-29-22 be close to death  Has cold now Wouldn't have come in for this No fever Non productive cough--or slight clear to yellow mucus Slight chest burning with cough  Current Outpatient Prescriptions on File Prior to Visit  Medication Sig Dispense Refill  . aspirin 81 MG tablet Take 81 mg by mouth daily.        Marland Kitchen levothyroxine (SYNTHROID) 112 MCG tablet Take 1 tablet (112 mcg total) by mouth daily.  90 tablet  3  . omeprazole (PRILOSEC) 20 MG capsule Take 20 mg by mouth daily.        Marland Kitchen triamterene-hydrochlorothiazide (MAXZIDE-25) 37.5-25 MG per tablet Take 1 each (1 tablet total) by mouth daily.  90 tablet  2    No Known Allergies  Past Medical History  Diagnosis Date  . Hypertension   . Pneumonia   . GERD (gastroesophageal reflux disease)   . Leiomyoma of uterus, unspecified   . Unspecified hypothyroidism     Past Surgical History  Procedure Date  . Dilation and curettage of uterus 10/29/1998    Family History  Problem Relation Age of Onset  . Hypertension Mother   . Cancer Mother     Bone Marrow   . Arthritis Sister   . Cancer Paternal Grandfather     lung cancer    History   Social History  . Marital Status: Single    Spouse Name: N/A    Number of Children: 1  . Years of Education: N/A   Occupational History  . [employer], customer service    Social History Main Topics  . Smoking status: Never Smoker   . Smokeless tobacco: Never Used  . Alcohol Use: Yes  . Drug Use: No  . Sexually Active: Not on file   Other Topics Concern  . Not on file   Social History Narrative   Live in boyfriend since 10/29/2006. He has 2 children living there      Review of Systems  Constitutional: Positive for unexpected weight change. Negative for fatigue.       Weight up and down with stress with mom Wears seat belt  HENT: Positive for congestion and sinus pressure. Negative for hearing loss, dental problem and tinnitus.        Regular with dentist  Eyes: Negative for visual disturbance.       No diplopia or unilateral vision loss  Respiratory: Positive for cough. Negative for chest tightness and shortness of breath.   Cardiovascular: Negative for chest pain, palpitations and leg swelling.  Gastrointestinal: Positive for diarrhea. Negative for nausea, vomiting, constipation and blood in stool.       No heartburn on the prilosec (also controlled former chronic cough) Slight runny stools with cold but still not daily  Genitourinary: Positive for menstrual problem. Negative for dysuria, difficulty urinating and dyspareunia.  Musculoskeletal: Positive for myalgias and arthralgias. Negative for back pain and joint swelling.       Slight right wrist pain lately Some feet cramps  Skin: Negative for rash.       No suspicious lesions  Neurological: Positive for dizziness and headaches. Negative for syncope, weakness  and numbness.       Some dizziness when blowing nose with cold Headache with cold  Hematological: Negative for adenopathy. Does not bruise/bleed easily.  Psychiatric/Behavioral: Negative for sleep disturbance and dysphoric mood. The patient is nervous/anxious.        Lots of stress       Objective:   Physical Exam  Constitutional: She is oriented to person, place, and time. She appears well-developed and well-nourished. No distress.  HENT:  Head: Normocephalic and atraumatic.  Right Ear: External ear normal.  Left Ear: External ear normal.  Mouth/Throat: Oropharynx is clear and moist. No oropharyngeal exudate.  Eyes: Conjunctivae and EOM are normal. Pupils are equal, round, and reactive to light.  Neck: Normal range of motion.  Neck supple. No thyromegaly present.  Cardiovascular: Normal rate, regular rhythm, normal heart sounds and intact distal pulses.  Exam reveals no gallop.   No murmur heard. Pulmonary/Chest: Effort normal and breath sounds normal. No respiratory distress. She has no wheezes. She has no rales.  Abdominal: Soft. There is no tenderness.  Genitourinary:       Breasts have periareolar cystic changes but no worrisome features  Musculoskeletal: Normal range of motion. She exhibits no edema and no tenderness.  Lymphadenopathy:    She has no cervical adenopathy.    She has no axillary adenopathy.  Neurological: She is alert and oriented to person, place, and time.  Skin: No rash noted. No erythema.  Psychiatric: She has a normal mood and affect. Her behavior is normal. Judgment and thought content normal.          Assessment & Plan:

## 2011-07-26 LAB — BASIC METABOLIC PANEL
BUN/Creatinine Ratio: 15 (ref 9–23)
Calcium: 9.4 mg/dL (ref 8.7–10.2)
Creatinine, Ser: 0.84 mg/dL (ref 0.57–1.00)
GFR calc Af Amer: 98 mL/min/{1.73_m2} (ref >59)
GFR calc non Af Amer: 85 mL/min/{1.73_m2} (ref >59)
Potassium: 4.4 mmol/L (ref 3.5–5.2)
Sodium: 139 mmol/L (ref 134–144)

## 2011-07-26 LAB — LIPID PANEL
Cholesterol, Total: 203 mg/dL — ABNORMAL HIGH (ref 100–199)
HDL: 58 mg/dL (ref >39)
Triglycerides: 118 mg/dL (ref 0–149)
VLDL Cholesterol Cal: 24 mg/dL (ref 5–40)

## 2011-07-26 LAB — CBC WITH DIFFERENTIAL/PLATELET
Basophils Absolute: 0 10*3/uL (ref 0.0–0.2)
Eos: 9 % — ABNORMAL HIGH (ref 0–7)
HCT: 36.4 % (ref 34.0–46.6)
Hemoglobin: 11.1 g/dL (ref 11.1–15.9)
Lymphocytes Absolute: 1.3 10*3/uL (ref 0.7–4.5)
MCHC: 30.5 g/dL — ABNORMAL LOW (ref 31.5–35.7)
MCV: 71 fL — ABNORMAL LOW (ref 79–97)
Monocytes Absolute: 0.7 10*3/uL (ref 0.1–1.0)
Monocytes: 12 % (ref 4–13)
Neutrophils Absolute: 2.9 10*3/uL (ref 1.8–7.8)

## 2011-07-26 LAB — HEPATIC FUNCTION PANEL
AST: 17 IU/L (ref 0–40)
Albumin: 4.2 g/dL (ref 3.5–5.5)
Alkaline Phosphatase: 103 IU/L (ref 25–150)
Bilirubin, Direct: 0.06 mg/dL (ref 0.00–0.40)
Total Protein: 7.6 g/dL (ref 6.0–8.5)

## 2011-07-26 LAB — T4, FREE: Free T4: 1.62 ng/dL (ref 0.82–1.77)

## 2011-07-28 ENCOUNTER — Encounter: Payer: Self-pay | Admitting: *Deleted

## 2011-08-08 IMAGING — CT CT ABD-PELV W/ CM
1 of 3 series · 13 of 32 positions shown, 18 images · non-contrast
Comparison: none

REASON FOR EXAM: ABD PAIN GENERAL
COMMENTS:

[Series 2: abdomen · axial · 0.62mm/px · z∈[+149,+569]mm · 13 of 98 slices shown, 18 images]
[im 7/98  soft-tissue]
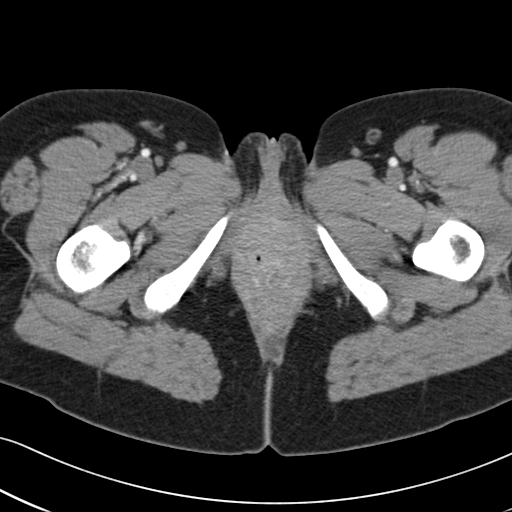
[im 7/98  bone]
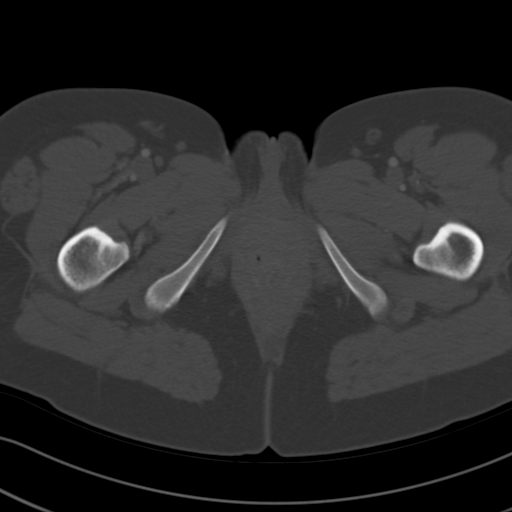
[im 13/98  soft-tissue]
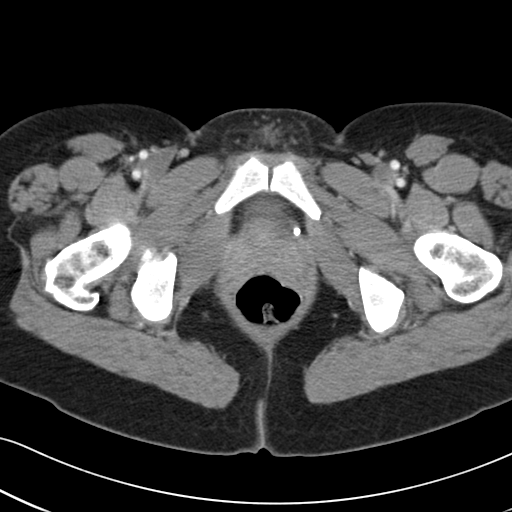
[im 20/98  soft-tissue]
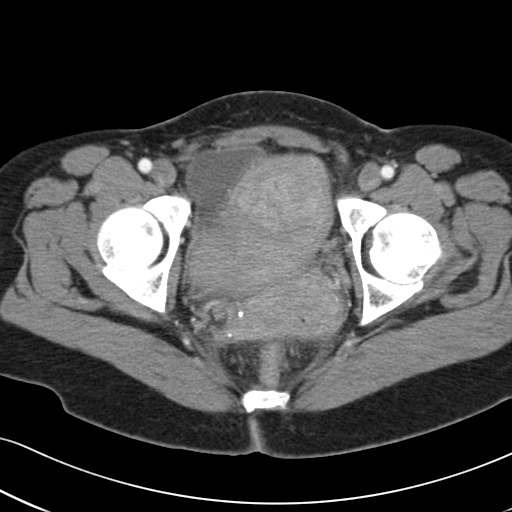
[im 33/98  soft-tissue]
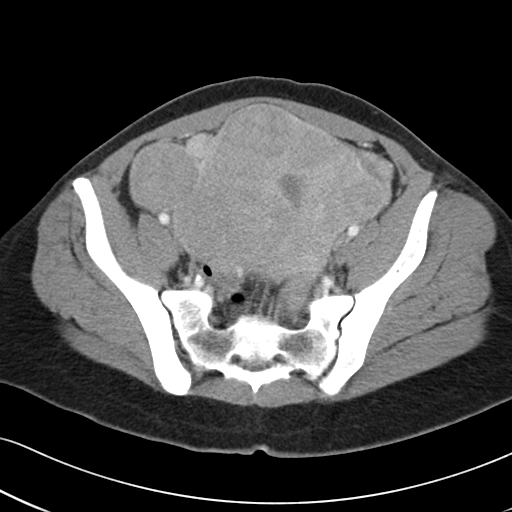
[im 39/98  soft-tissue]
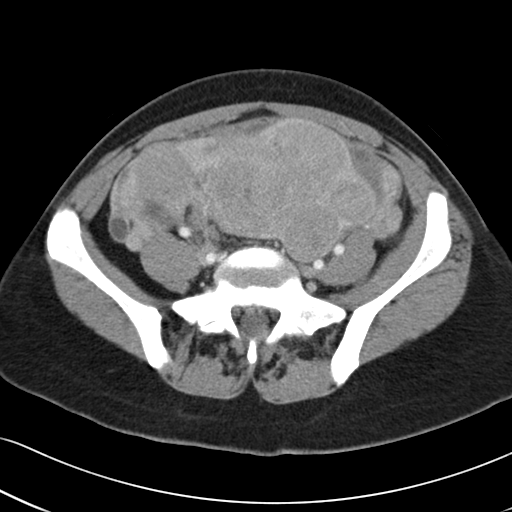
[im 46/98  soft-tissue]
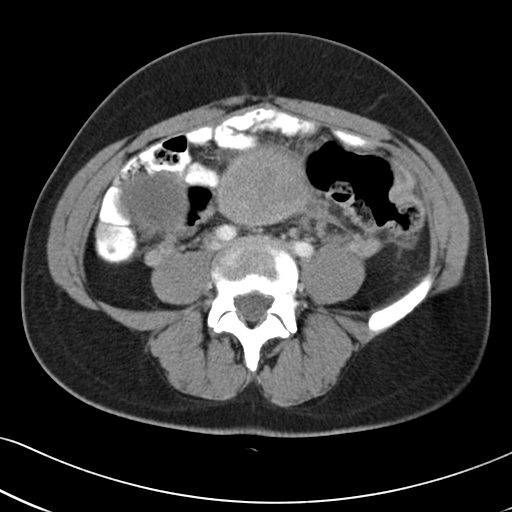
[im 52/98  soft-tissue]
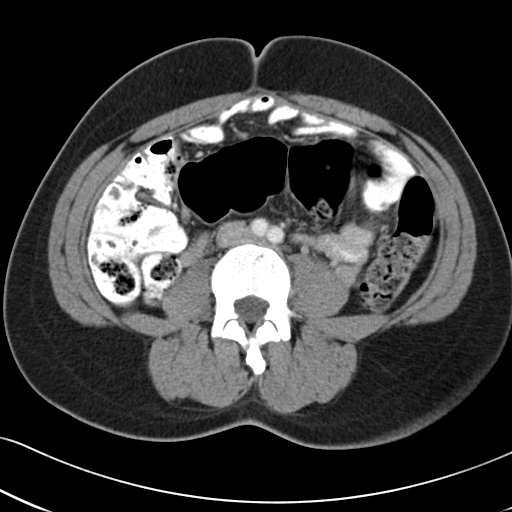
[im 59/98  soft-tissue]
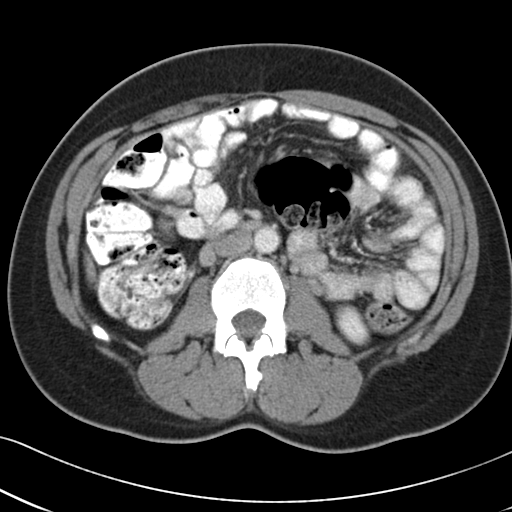
[im 65/98  soft-tissue]
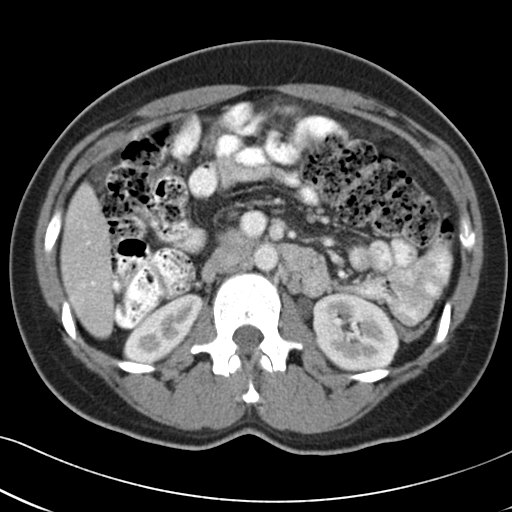
[im 65/98  bone]
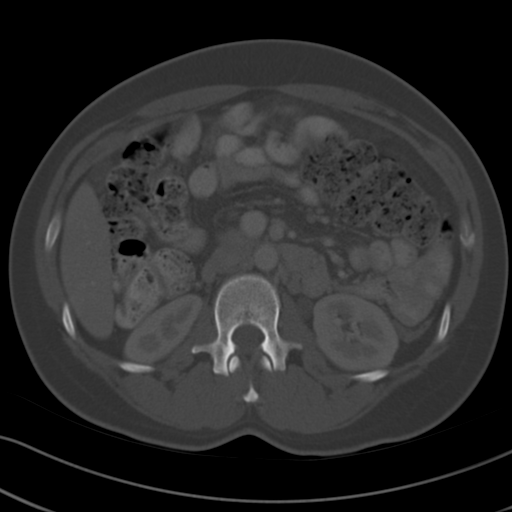
[im 72/98  lung]
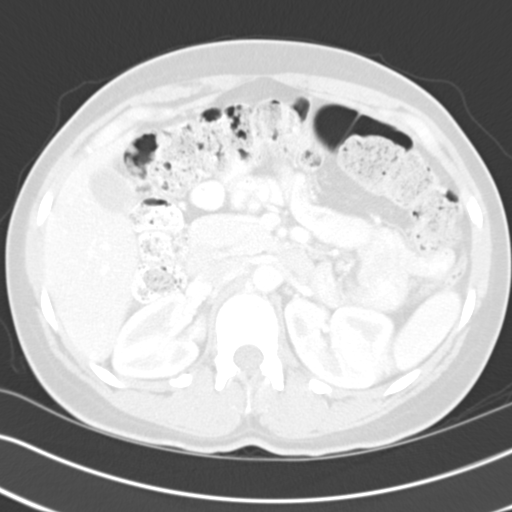
[im 78/98  soft-tissue]
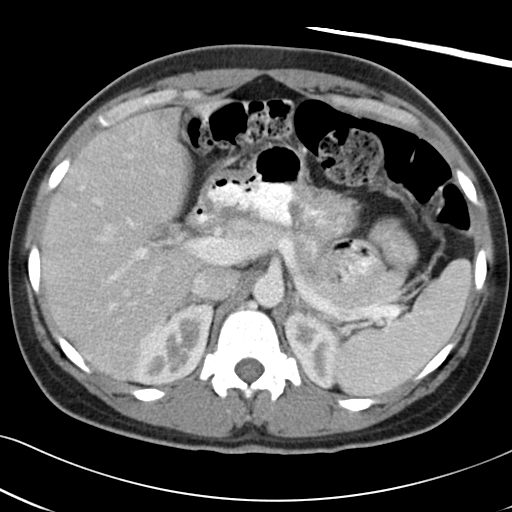
[im 78/98  lung]
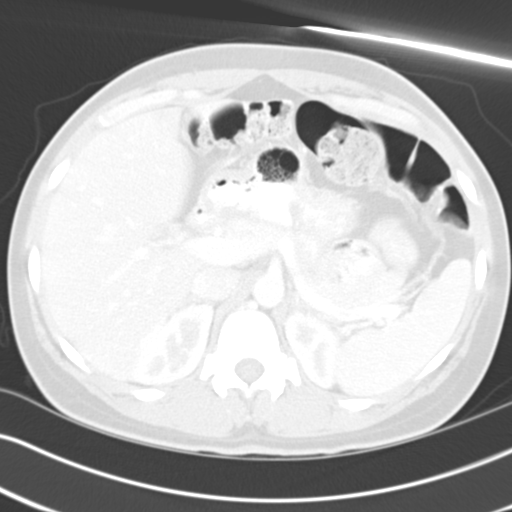
[im 85/98  soft-tissue]
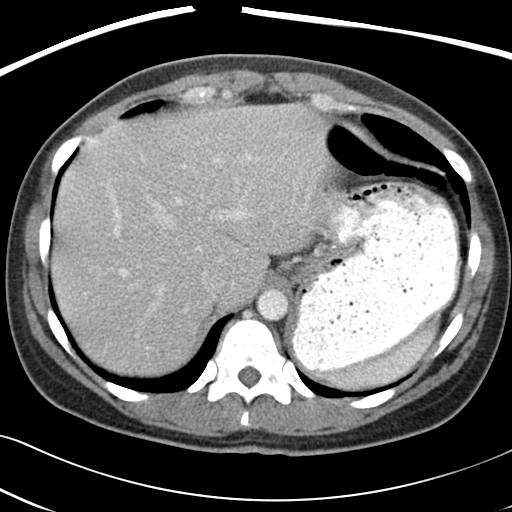
[im 85/98  lung]
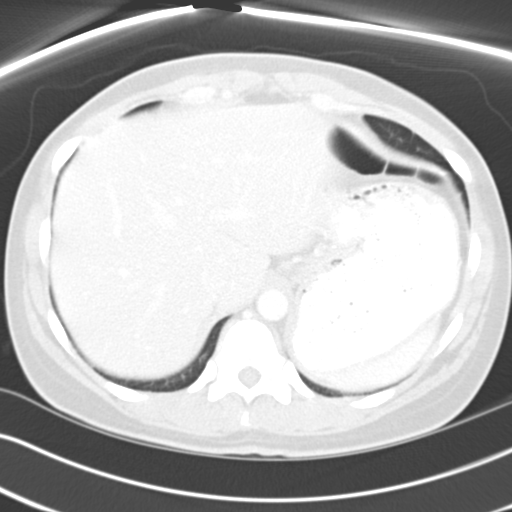
[im 91/98  soft-tissue]
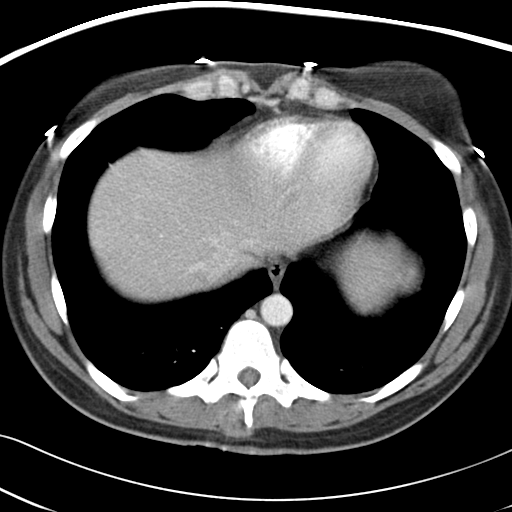
[im 91/98  lung]
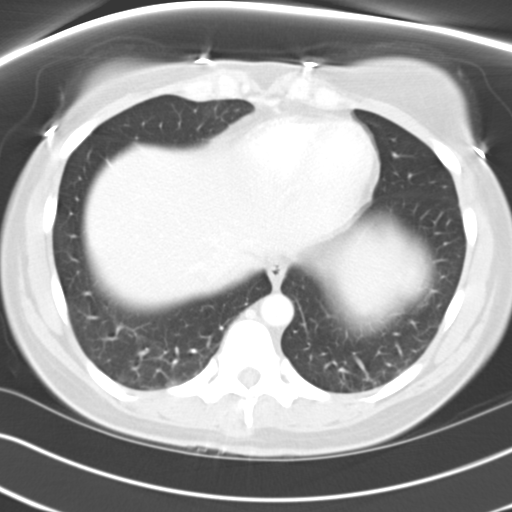

[13 of 32 positions shown; findings below may reference images not displayed]

PROCEDURE:     CT  - CT ABDOMEN / PELVIS  W  - December 19, 2009  [DATE]

RESULT:     Axial CT scanning was performed through the abdomen and pelvis
at 5 mm intervals and slice thicknesses. The patient received 100 cc of
Usovue-86B as well as oral contrast material. Review of multiplanar
reconstructed images was performed separately on the VIA monitor.

There is a large multilobulated pelvic mass. This appears to involve the
uterus and may reflect multiple exophytic fibroids. The overall maximal AP
dimension of the mass is approximately 12.8 cm. The maximal transverse
dimension is approximately 16.5 cm the maximal superior to inferior
dimension is approximately 17 cm. I do not see significant volumes of free
fluid in the pelvis. The urinary bladder is displaced inferiorly. The
sigmoid colon is relatively collapsed in the pelvis and appears mildly
distended just above the mass suggesting a partial obstructive physiology.
More proximally the colon contains a moderate amount of stool but does not
appear obstructed. The small bowel does not appear obstructed.

The liver, gallbladder, spleen, partially distended stomach, pancreas,
adrenal glands, and kidneys are normal in appearance. The caliber of the
abdominal aorta is normal. I see no periaortic or pericaval lymphadenopathy.
There is no evidence of omental caking or mesenteric lymphadenopathy. On
delayed images contrast within the renal collecting systems is normal in
appearance. There is fractional visualization of the ureters but there is no
evidence of obstruction. The lumbar vertebral bodies are preserved in
height. The lung bases are clear.
IMPRESSION: 1. There is a large multilobulated pelvic mass most compatible with multiple
large uterine fibroids. Pelvic ultrasound as well as GYN examination is
recommended.
2. There is mass effect upon the pelvic portion of the sigmoid colon with
displacement of the proximal sigmoid superiorly into the lower abdomen. I do
not see evidence of high-grade bowel obstruction but the pelvic portion of
the sigmoid is relatively collapsed while the more proximal portion is
mildly distended. The bowel gas pattern in the remainder colon suggests
constipation.
3. I do not see evidence of acute hepatobiliary abnormality nor acute
urinary tract abnormality.

## 2011-09-04 ENCOUNTER — Encounter: Payer: Self-pay | Admitting: *Deleted

## 2011-09-04 ENCOUNTER — Ambulatory Visit: Payer: Self-pay | Admitting: Internal Medicine

## 2011-09-08 ENCOUNTER — Telehealth: Payer: Self-pay | Admitting: *Deleted

## 2011-09-08 DIAGNOSIS — F32A Depression, unspecified: Secondary | ICD-10-CM

## 2011-09-08 DIAGNOSIS — F329 Major depressive disorder, single episode, unspecified: Secondary | ICD-10-CM

## 2011-09-08 MED ORDER — CITALOPRAM HYDROBROMIDE 40 MG PO TABS: 40.0000 mg | ORAL_TABLET | Freq: Every day | ORAL | Status: DC

## 2011-09-08 NOTE — Telephone Encounter (Signed)
Patient has run out of her citalopram and is need of a refill until she can get in with her primary care.

## 2011-09-17 ENCOUNTER — Telehealth: Payer: Self-pay | Admitting: *Deleted

## 2011-09-17 NOTE — Telephone Encounter (Signed)
Left message for patient, she had called our office on the 6th and requested that her medication be refilled until she could get an appointment with her primary care, so that she did not run out.  I have also notified the pharmacy of what the situation is.

## 2011-09-17 NOTE — Telephone Encounter (Signed)
Received message from [facility] that pt has question about Rx for citalopram ordered on 09/08/11.  Per pt, she was not seen by a doctor on 5/6 and was not aware that this medication was being prescribed for her.  Please clarify.  Review of chart indicates mediation was e-prescribed by Gaylyn Cheers, CMA.  Call info will be routed to her for clarification with [facility].

## 2011-11-18 ENCOUNTER — Telehealth: Payer: Self-pay | Admitting: Family Medicine

## 2011-11-18 ENCOUNTER — Other Ambulatory Visit: Payer: Self-pay

## 2011-11-18 ENCOUNTER — Other Ambulatory Visit: Payer: Self-pay | Admitting: Family Medicine

## 2011-11-18 MED ORDER — METRONIDAZOLE 500 MG PO TABS: 500.0000 mg | ORAL_TABLET | Freq: Two times a day (BID) | ORAL | Status: AC

## 2011-11-18 MED ORDER — TRIAMTERENE-HCTZ 37.5-25 MG PO TABS: 1.0000 | ORAL_TABLET | Freq: Every day | ORAL | Status: DC

## 2011-11-18 MED ORDER — LEVOTHYROXINE SODIUM 112 MCG PO TABS: 112.0000 ug | ORAL_TABLET | Freq: Every day | ORAL | Status: DC

## 2011-11-18 NOTE — Telephone Encounter (Signed)
Patient called she has vaginal discharge with odor and is requesting Metronidazole to be called in.  I left a message with patient to call back with pharmacy preference.

## 2011-11-18 NOTE — Progress Notes (Signed)
Pt request refill triamterene-HCTZ and levothyroxine to [facility].pt notified refilled while on phone.

## 2011-11-18 NOTE — Telephone Encounter (Signed)
Patient would like this called to [facility] [address] in [place].

## 2011-11-19 ENCOUNTER — Telehealth: Payer: Self-pay

## 2011-11-19 NOTE — Telephone Encounter (Signed)
[facility] left v/m to clarify levothyroxine 112 mcg refill. Per pt request pt has been receiving generic for Levothroid by [place];wants to know if OK to continue generic for Levothroid instead of generic for Synthroid.Please advise.

## 2011-11-19 NOTE — Telephone Encounter (Signed)
Pretty much the same thing  That is fine

## 2011-11-20 NOTE — Telephone Encounter (Signed)
Spoke with [facility] and advised results, they will send out rx

## 2011-12-22 ENCOUNTER — Telehealth: Payer: Self-pay | Admitting: Family Medicine

## 2011-12-22 NOTE — Telephone Encounter (Signed)
Patient states she was offered in the past a pill that would stop her periods.  She now wants to take this medication.  She would like this called in, she did not know the name of the drug.  [facility] [address]

## 2011-12-23 ENCOUNTER — Telehealth: Payer: Self-pay

## 2011-12-23 NOTE — Telephone Encounter (Signed)
PATIENT CALLED ABOUT BLEEDING DR. PRATT RECOMMENDED MEGACE 40MG  TO [facility] [address]

## 2012-02-09 ENCOUNTER — Telehealth: Payer: Self-pay | Admitting: Gynecology

## 2012-02-09 DIAGNOSIS — N760 Acute vaginitis: Secondary | ICD-10-CM

## 2012-02-09 DIAGNOSIS — B9689 Other specified bacterial agents as the cause of diseases classified elsewhere: Secondary | ICD-10-CM

## 2012-02-09 MED ORDER — METRONIDAZOLE 500 MG PO TABS: 500.0000 mg | ORAL_TABLET | Freq: Two times a day (BID) | ORAL | Status: DC

## 2012-02-09 NOTE — Telephone Encounter (Signed)
Patient call to have Rx Metronidazole send [facility]. Patient c/o of recurrent bacterial vaginosis.  Script send to pharmacy and advise patient if symptoms persist she need to come in office and see the physician.

## 2012-02-09 NOTE — Telephone Encounter (Signed)
Advised patient to make appointment for follow up, not seen since March.

## 2012-04-21 ENCOUNTER — Other Ambulatory Visit: Payer: Self-pay | Admitting: Family Medicine

## 2012-05-11 ENCOUNTER — Encounter: Payer: Self-pay | Admitting: Internal Medicine

## 2012-05-11 ENCOUNTER — Encounter: Payer: Self-pay | Admitting: *Deleted

## 2012-05-11 ENCOUNTER — Ambulatory Visit (INDEPENDENT_AMBULATORY_CARE_PROVIDER_SITE_OTHER): Payer: 59 | Admitting: Internal Medicine

## 2012-05-11 VITALS — BP 142/90 | HR 105 | Temp 98.0°F | Wt 188.0 lb

## 2012-05-11 DIAGNOSIS — J069 Acute upper respiratory infection, unspecified: Secondary | ICD-10-CM

## 2012-05-11 DIAGNOSIS — E890 Postprocedural hypothyroidism: Secondary | ICD-10-CM

## 2012-05-11 DIAGNOSIS — I10 Essential (primary) hypertension: Secondary | ICD-10-CM

## 2012-05-11 MED ORDER — AMOXICILLIN 500 MG PO TABS: 1000.0000 mg | ORAL_TABLET | Freq: Two times a day (BID) | ORAL | Status: DC

## 2012-05-11 NOTE — Assessment & Plan Note (Signed)
BP Readings from Last 3 Encounters:  05/11/12 142/90  07/25/11 130/90  06/17/11 149/110   Initial reading 170/110 here and then down after a few minutes No changes now since could be from the cold meds

## 2012-05-11 NOTE — Assessment & Plan Note (Signed)
She is concerned about recent symptoms Will recheck the labs

## 2012-05-11 NOTE — Addendum Note (Signed)
Addended by: Liane Comber C on: 05/11/2012 10:02 AM   Modules accepted: Orders

## 2012-05-11 NOTE — Progress Notes (Signed)
  Subjective:    Patient ID: Kristina Gardner, female    DOB: January 21, 1971, 42 y.o.   MRN: [id number]  HPI Has had a cold for a while-- 4-5 days Some chest congestion  Pain ("like on fire") with cough  Feels some better today May have had fever on first day Slight SOB on first day only Cough productive of yellow mucus Does note PND and head congestion  Some initial sore throat --better now No ear pain  Tried mucinex multisymptom med---seems to help some  Had some palpitations Is concerned her thyroid is acting up again Wants labs checked  Current Outpatient Prescriptions on File Prior to Visit  Medication Sig Dispense Refill  . aspirin 81 MG tablet Take 81 mg by mouth daily.        Marland Kitchen levothyroxine (SYNTHROID) 112 MCG tablet Take 1 tablet (112 mcg total) by mouth daily.  90 tablet  2  . megestrol (MEGACE) 40 MG tablet TAKE 1 TABLET BY MOUTH TWICE A DAY UNTIL BLEEDING STOPS  45 tablet  1  . omeprazole (PRILOSEC) 20 MG capsule Take 20 mg by mouth daily.        Marland Kitchen triamterene-hydrochlorothiazide (MAXZIDE-25) 37.5-25 MG per tablet Take 1 each (1 tablet total) by mouth daily.  90 tablet  2    No Known Allergies  Past Medical History  Diagnosis Date  . Hypertension   . Pneumonia   . GERD (gastroesophageal reflux disease)   . Leiomyoma of uterus, unspecified   . Unspecified hypothyroidism     Past Surgical History  Procedure Date  . Dilation and curettage of uterus 2000    Family History  Problem Relation Age of Onset  . Hypertension Mother   . Cancer Mother     Bone Marrow   . Arthritis Sister   . Cancer Paternal Grandfather     lung cancer    History   Social History  . Marital Status: Single    Spouse Name: N/A    Number of Children: 1  . Years of Education: N/A   Occupational History  . [employer], customer service    Social History Main Topics  . Smoking status: Never Smoker   . Smokeless tobacco: Never Used  . Alcohol Use: Yes  . Drug Use: No  .  Sexually Active: Not on file   Other Topics Concern  . Not on file   Social History Narrative   Live in boyfriend since ~2008. He has 2 children living there    Review of Systems No rash No vomiting or diarrhea Some abdominal pain---she related to the med Appetite okay    Objective:   Physical Exam  Constitutional: She appears well-developed and well-nourished. No distress.  HENT:  Mouth/Throat: Oropharynx is clear and moist. No oropharyngeal exudate.       No sinus tenderness TMs normal Moderate nasal inflammation and swelling  Neck: Normal range of motion. Neck supple. No thyromegaly present.  Pulmonary/Chest: Effort normal and breath sounds normal. No respiratory distress. She has no wheezes. She has no rales.  Lymphadenopathy:    She has no cervical adenopathy.          Assessment & Plan:

## 2012-05-11 NOTE — Assessment & Plan Note (Signed)
Has both bronchial and sinus symptoms Still likely viral Discussed supportive care If worse later in week, will fill the amoxil

## 2012-05-12 LAB — T4, FREE: Free T4: 1.28 ng/dL (ref 0.82–1.77)

## 2012-05-12 LAB — TSH: TSH: 2.99 u[IU]/mL (ref 0.450–4.500)

## 2012-06-04 ENCOUNTER — Telehealth: Payer: Self-pay | Admitting: Gynecology

## 2012-06-04 DIAGNOSIS — B9689 Other specified bacterial agents as the cause of diseases classified elsewhere: Secondary | ICD-10-CM

## 2012-06-04 DIAGNOSIS — N760 Acute vaginitis: Secondary | ICD-10-CM

## 2012-06-04 MED ORDER — METRONIDAZOLE 500 MG PO TABS: 500.0000 mg | ORAL_TABLET | Freq: Two times a day (BID) | ORAL | Status: DC

## 2012-06-04 NOTE — Telephone Encounter (Signed)
Patient call regarding symptoms of heavy discharge with odor. Rx was sent to pharmacy for Metronidazole 500mg  bid x 5 days. Patient is aware that if symptoms are not better to make an appointment to see the doctor.

## 2012-06-19 ENCOUNTER — Other Ambulatory Visit: Payer: Self-pay

## 2012-07-10 IMAGING — US IR UTERINE FIBROID EMBOLIZATION
1 series · 1 of 1 positions shown · non-contrast
Comparison: none

INDICATION: 40-year-old with menorrhagia and uterine fibroids.

[Series 1: ir angio/visceral · 1 of 1 slices shown]
[im 1/1]
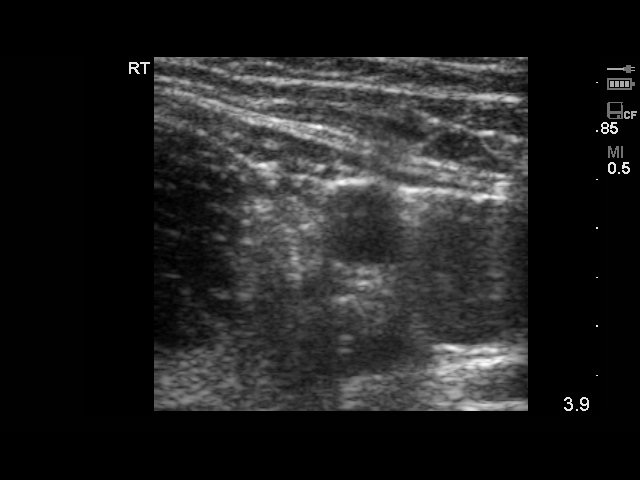

[1 of 1 positions shown; findings below may reference images not displayed]

PROCEDURE(S):  BILATERAL UTERINE ARTERY EMBOLIZATION

Medications: Toradol 30 mg, Fentanyl 375 mcg, Versed 4 mg. A
radiology nurse monitored the patient for moderate sedation.

As antibiotic prophylaxis, Ancef 1 gm was ordered pre-procedure and
administered intravenously within one hour of incision.

Moderate sedation time: 110 minutes minutes

Fluoroscopy time: 32.8 minutes

Contrast:  100 ml [id number]-B22

Procedure:Informed consent was obtained for the uterine artery
embolization procedure.  The patient was placed supine on the
interventional table.  The patient had palpable pedal pulses.  The
right groin was prepped and draped in a sterile fashion.  Maximal
barrier sterile technique was utilized including caps, mask,
sterile gowns, sterile gloves, sterile drape, hand hygiene and skin
antiseptic.  The skin was anesthetized 1% lidocaine.  Using
ultrasound guidance, 21 gauge needle was directed in the right
common femoral artery and a micropuncture dilator set was placed.
The vascular access was upsized to a 5-French vascular sheath.  A
Cobra catheter was used to cannulate the left common iliac artery
and the left internal iliac artery.  A series of arteriograms were
performed to identify the left uterine artery orfice.  A Progreat
microcatheter was advanced into the left uterine artery.  2.6 vials
of Embospheres (500 - 700 micron) were injected through the
microcatheter with fluoroscopic guidance.  There was near complete
stasis of the left uterine artery following administration of the
particles.  Sandip Tiger loop was formed using the Cobra catheter
and the right internal iliac artery was cannulated.  The right
uterine artery was identified with contrast angiograms.  The
microcatheter was advanced into the right uterine artery and a
series of angiograms were performed. 2 vials of Embospheres (500-
700 micron) were injected through the microcatheter with
fluoroscopic guidance.  There was near complete stasis of the right
uterine artery at the end of the procedure.  The microcatheter was
removed.  The Cobra catheter was straightened out over the aortic
bifurcation and removed over a wire.  The vascular sheath was
removed with an Exoseal closure device.
FINDINGS: Large uterine arteries bilaterally.  Near complete stasis
of the uterine arteries at the end of the procedure.  Fluoroscopic
images were obtained for documentation.

Complicatons: None
IMPRESSION: Successful uterine artery embolization procedure.

## 2012-09-14 ENCOUNTER — Telehealth: Payer: Self-pay | Admitting: *Deleted

## 2012-09-14 DIAGNOSIS — B9689 Other specified bacterial agents as the cause of diseases classified elsewhere: Secondary | ICD-10-CM

## 2012-09-14 DIAGNOSIS — N760 Acute vaginitis: Secondary | ICD-10-CM

## 2012-09-14 MED ORDER — METRONIDAZOLE 500 MG PO TABS: 500.0000 mg | ORAL_TABLET | Freq: Two times a day (BID) | ORAL | Status: DC

## 2012-09-14 NOTE — Telephone Encounter (Signed)
Patient is requesting a refill of her metronidazole due to increase vaginal odor and lotiony discharge.

## 2012-09-17 ENCOUNTER — Ambulatory Visit: Payer: 59 | Admitting: Obstetrics & Gynecology

## 2012-09-28 ENCOUNTER — Other Ambulatory Visit: Payer: Self-pay | Admitting: Obstetrics and Gynecology

## 2012-09-29 ENCOUNTER — Telehealth: Payer: Self-pay | Admitting: *Deleted

## 2012-09-29 DIAGNOSIS — B9689 Other specified bacterial agents as the cause of diseases classified elsewhere: Secondary | ICD-10-CM

## 2012-09-29 MED ORDER — METRONIDAZOLE 500 MG PO TABS: 500.0000 mg | ORAL_TABLET | Freq: Two times a day (BID) | ORAL | Status: DC

## 2012-09-29 NOTE — Telephone Encounter (Signed)
Patient sent a my chart message requesting a refill of metronidazole for bacterial vaginosis.

## 2012-10-26 ENCOUNTER — Ambulatory Visit (INDEPENDENT_AMBULATORY_CARE_PROVIDER_SITE_OTHER): Payer: 59 | Admitting: Obstetrics and Gynecology

## 2012-10-26 ENCOUNTER — Encounter: Payer: Self-pay | Admitting: Obstetrics and Gynecology

## 2012-10-26 VITALS — BP 166/128 | HR 84 | Ht 67.0 in | Wt 198.0 lb

## 2012-10-26 DIAGNOSIS — N920 Excessive and frequent menstruation with regular cycle: Secondary | ICD-10-CM

## 2012-10-26 DIAGNOSIS — Z01812 Encounter for preprocedural laboratory examination: Secondary | ICD-10-CM

## 2012-10-26 DIAGNOSIS — Z30430 Encounter for insertion of intrauterine contraceptive device: Secondary | ICD-10-CM

## 2012-10-26 NOTE — Patient Instructions (Signed)
Levonorgestrel intrauterine device (IUD) What is this medicine? LEVONORGESTREL IUD (LEE voe nor jes trel) is a contraceptive (birth control) device. The device is placed inside the uterus by a healthcare professional. It is used to prevent pregnancy and can also be used to treat heavy bleeding that occurs during your period. Depending on the device, it can be used for 3 to 5 years. This medicine may be used for other purposes; ask your health care provider or pharmacist if you have questions. What should I tell my health care provider before I take this medicine? They need to know if you have any of these conditions: -abnormal Pap smear -cancer of the breast, uterus, or cervix -diabetes -endometritis -genital or pelvic infection now or in the past -have more than one sexual partner or your partner has more than one partner -heart disease -history of an ectopic or tubal pregnancy -immune system problems -IUD in place -liver disease or tumor -problems with blood clots or take blood-thinners -use intravenous drugs -uterus of unusual shape -vaginal bleeding that has not been explained -an unusual or allergic reaction to levonorgestrel, other hormones, silicone, or polyethylene, medicines, foods, dyes, or preservatives -pregnant or trying to get pregnant -breast-feeding How should I use this medicine? This device is placed inside the uterus by a health care professional. Talk to your pediatrician regarding the use of this medicine in children. Special care may be needed. Overdosage: If you think you have taken too much of this medicine contact a poison control center or emergency room at once. NOTE: This medicine is only for you. Do not share this medicine with others. What if I miss a dose? This does not apply. What may interact with this medicine? Do not take this medicine with any of the following medications: -amprenavir -bosentan -fosamprenavir This medicine may also interact with  the following medications: -aprepitant -barbiturate medicines for inducing sleep or treating seizures -bexarotene -griseofulvin -medicines to treat seizures like carbamazepine, ethotoin, felbamate, oxcarbazepine, phenytoin, topiramate -modafinil -pioglitazone -rifabutin -rifampin -rifapentine -some medicines to treat HIV infection like atazanavir, indinavir, lopinavir, nelfinavir, tipranavir, ritonavir -St. John's wort -warfarin This list may not describe all possible interactions. Give your health care provider a list of all the medicines, herbs, non-prescription drugs, or dietary supplements you use. Also tell them if you smoke, drink alcohol, or use illegal drugs. Some items may interact with your medicine. What should I watch for while using this medicine? Visit your doctor or health care professional for regular check ups. See your doctor if you or your partner has sexual contact with others, becomes HIV positive, or gets a sexual transmitted disease. This product does not protect you against HIV infection (AIDS) or other sexually transmitted diseases. You can check the placement of the IUD yourself by reaching up to the top of your vagina with clean fingers to feel the threads. Do not pull on the threads. It is a good habit to check placement after each menstrual period. Call your doctor right away if you feel more of the IUD than just the threads or if you cannot feel the threads at all. The IUD may come out by itself. You may become pregnant if the device comes out. If you notice that the IUD has come out use a backup birth control method like condoms and call your health care provider. Using tampons will not change the position of the IUD and are okay to use during your period. What side effects may I notice from receiving this medicine?   Side effects that you should report to your doctor or health care professional as soon as possible: -allergic reactions like skin rash, itching or  hives, swelling of the face, lips, or tongue -fever, flu-like symptoms -genital sores -high blood pressure -no menstrual period for 6 weeks during use -pain, swelling, warmth in the leg -pelvic pain or tenderness -severe or sudden headache -signs of pregnancy -stomach cramping -sudden shortness of breath -trouble with balance, talking, or walking -unusual vaginal bleeding, discharge -yellowing of the eyes or skin Side effects that usually do not require medical attention (report to your doctor or health care professional if they continue or are bothersome): -acne -breast pain -change in sex drive or performance -changes in weight -cramping, dizziness, or faintness while the device is being inserted -headache -irregular menstrual bleeding within first 3 to 6 months of use -nausea This list may not describe all possible side effects. Call your doctor for medical advice about side effects. You may report side effects to FDA at 1-800-FDA-1088. Where should I keep my medicine? This does not apply. NOTE: This sheet is a summary. It may not cover all possible information. If you have questions about this medicine, talk to your doctor, pharmacist, or health care provider.  2013, [employer] Standard. (05/22/2011 1:54:04 PM)  

## 2012-10-26 NOTE — Progress Notes (Signed)
Patient ID: Armonee Bojanowski, female   DOB: 1970/07/07, 42 y.o.   MRN: [id number] 42 yo with history of fibroid and menorrhagia s/p [place] with persistent menorrhagia here for IUD insertion   IUD Procedure Note Patient identified, informed consent performed, signed copy in chart, time out was performed.  Urine pregnancy test negative.  Speculum placed in the vagina.  Cervix visualized.  Cleaned with Betadine x 2.  Grasped anteriorly with a single tooth tenaculum.  Uterus sounded to 7 cm.  Mirena IUD was not able to be inserted. It would not go through external os at all, despite the fact that uterine sound went it without difficulty. Skyla IUD placed per manufacturer's recommendations.  Strings trimmed to 3 cm. Tenaculum was removed, good hemostasis noted.  Patient tolerated procedure well.   Patient given post procedure instructions and Skyla care card with expiration date.  Patient is asked to check IUD strings periodically and follow up in 4-6 weeks for IUD check.  Patient understands that she can expect to continue to have a monthly cycle but the flow should be decreased. Patient is contemplating hysterectomy if medical management of her menorrhagia fails or if symptoms related to presence of fibroid worsen.

## 2012-11-09 ENCOUNTER — Encounter: Payer: Self-pay | Admitting: Internal Medicine

## 2012-11-09 ENCOUNTER — Ambulatory Visit (INDEPENDENT_AMBULATORY_CARE_PROVIDER_SITE_OTHER): Payer: 59 | Admitting: Internal Medicine

## 2012-11-09 VITALS — BP 130/90 | HR 98 | Temp 98.3°F | Wt 198.0 lb

## 2012-11-09 DIAGNOSIS — I10 Essential (primary) hypertension: Secondary | ICD-10-CM

## 2012-11-09 DIAGNOSIS — E890 Postprocedural hypothyroidism: Secondary | ICD-10-CM

## 2012-11-09 DIAGNOSIS — Z0000 Encounter for general adult medical examination without abnormal findings: Secondary | ICD-10-CM

## 2012-11-09 DIAGNOSIS — L918 Other hypertrophic disorders of the skin: Secondary | ICD-10-CM | POA: Insufficient documentation

## 2012-11-09 DIAGNOSIS — L909 Atrophic disorder of skin, unspecified: Secondary | ICD-10-CM

## 2012-11-09 NOTE — Assessment & Plan Note (Signed)
BP Readings from Last 3 Encounters:  11/09/12 130/90  10/26/12 166/128  05/11/12 142/90   Generally fine except when IUD was place Due for labs

## 2012-11-09 NOTE — Assessment & Plan Note (Signed)
Treated 40 seconds x 2 Tolerated well

## 2012-11-09 NOTE — Assessment & Plan Note (Signed)
If she stays fatigued despite improved lifestyle, could consider adding T3 to her Rx

## 2012-11-09 NOTE — Patient Instructions (Addendum)
Please call to set up your screening mammogram  DASH Diet The DASH diet stands for "Dietary Approaches to Stop Hypertension." It is a healthy eating plan that has been shown to reduce high blood pressure (hypertension) in as little as 14 days, while also possibly providing other significant health benefits. These other health benefits include reducing the risk of breast cancer after menopause and reducing the risk of type 2 diabetes, heart disease, colon cancer, and stroke. Health benefits also include weight loss and slowing kidney failure in patients with chronic kidney disease.  DIET GUIDELINES  Limit salt (sodium). Your diet should contain less than 1500 mg of sodium daily.  Limit refined or processed carbohydrates. Your diet should include mostly whole grains. Desserts and added sugars should be used sparingly.  Include small amounts of heart-healthy fats. These types of fats include nuts, oils, and tub margarine. Limit saturated and trans fats. These fats have been shown to be harmful in the body. CHOOSING FOODS  The following food groups are based on a 2000 calorie diet. See your Registered Dietitian for individual calorie needs. Grains and Grain Products (6 to 8 servings daily)  Eat More Often: Whole-wheat bread, brown rice, whole-grain or wheat pasta, quinoa, popcorn without added fat or salt (air popped).  Eat Less Often: White bread, white pasta, white rice, cornbread. Vegetables (4 to 5 servings daily)  Eat More Often: Fresh, frozen, and canned vegetables. Vegetables may be raw, steamed, roasted, or grilled with a minimal amount of fat.  Eat Less Often/Avoid: Creamed or fried vegetables. Vegetables in a cheese sauce. Fruit (4 to 5 servings daily)  Eat More Often: All fresh, canned (in natural juice), or frozen fruits. Dried fruits without added sugar. One hundred percent fruit juice ( cup [237 mL] daily).  Eat Less Often: Dried fruits with added sugar. Canned fruit in light or  heavy syrup. [employer], Fish, and Poultry (2 servings or less daily. One serving is 3 to 4 oz [85-114 g]).  Eat More Often: Ninety percent or leaner ground beef, tenderloin, sirloin. Round cuts of beef, chicken breast, [place] breast. All fish. Grill, bake, or broil your meat. Nothing should be fried.  Eat Less Often/Avoid: Fatty cuts of meat, [place], or chicken leg, thigh, or wing. Fried cuts of meat or fish. Dairy (2 to 3 servings)  Eat More Often: Low-fat or fat-free milk, low-fat plain or light yogurt, reduced-fat or part-skim cheese.  Eat Less Often/Avoid: Milk (whole, 2%).Whole milk yogurt. Full-fat cheeses. Nuts, Seeds, and Legumes (4 to 5 servings per week)  Eat More Often: All without added salt.  Eat Less Often/Avoid: Salted nuts and seeds, canned beans with added salt. Fats and Sweets (limited)  Eat More Often: Vegetable oils, tub margarines without trans fats, sugar-free gelatin. Mayonnaise and salad dressings.  Eat Less Often/Avoid: Coconut oils, palm oils, butter, stick margarine, cream, half and half, cookies, candy, pie. FOR MORE INFORMATION The Dash Diet Eating Plan: www.dashdiet.org Document Released: 04/10/2011 Document Revised: 07/14/2011 Document Reviewed: 04/10/2011 [facility] Patient Information 2014 [place], [place].

## 2012-11-09 NOTE — Assessment & Plan Note (Signed)
Seems healthy but some fatigue Discussed diet and exercise Due for mammo

## 2012-11-09 NOTE — Progress Notes (Signed)
Subjective:    Patient ID: Kristina Gardner, female    DOB: 05/14/1970, 42 y.o.   MRN: [id number]  HPI Here for physical Ongoing menorrhagia problems---new IUD just placed Seems to have helped pain and so far less bleeding  Doesn't check BP Doesn't know of any problems  Current Outpatient Prescriptions on File Prior to Visit  Medication Sig Dispense Refill  . aspirin 81 MG tablet Take 81 mg by mouth daily.        Marland Kitchen levothyroxine (SYNTHROID) 112 MCG tablet Take 1 tablet (112 mcg total) by mouth daily.  90 tablet  2  . omeprazole (PRILOSEC) 20 MG capsule Take 20 mg by mouth daily.        Marland Kitchen triamterene-hydrochlorothiazide (MAXZIDE-25) 37.5-25 MG per tablet Take 1 each (1 tablet total) by mouth daily.  90 tablet  2   No current facility-administered medications on file prior to visit.    No Known Allergies  Past Medical History  Diagnosis Date  . Hypertension   . Pneumonia   . GERD (gastroesophageal reflux disease)   . Leiomyoma of uterus, unspecified   . Unspecified hypothyroidism     Past Surgical History  Procedure Laterality Date  . Dilation and curettage of uterus  2000    Family History  Problem Relation Age of Onset  . Hypertension Mother   . Cancer Mother     Bone Marrow   . Kidney disease Mother   . Arthritis Sister   . Cancer Paternal Grandfather     lung cancer    History   Social History  . Marital Status: Single    Spouse Name: N/A    Number of Children: 1  . Years of Education: N/A   Occupational History  . [employer], customer service    Social History Main Topics  . Smoking status: Never Smoker   . Smokeless tobacco: Never Used  . Alcohol Use: Yes  . Drug Use: No  . Sexually Active: Not on file   Other Topics Concern  . Not on file   Social History Narrative   Live in boyfriend since ~2008. He has 2 children-- don't stay with them anymore   Review of Systems  Constitutional: Positive for unexpected weight change. Negative for  fatigue.       Weight up a lot after mom died--- needs to be more careful with lifestyle Wears seat belt  HENT: Negative for hearing loss, congestion, rhinorrhea, dental problem and tinnitus.        Regular with dentist  Eyes: Negative for visual disturbance.       Noo diplopia or unilateral vision loss  Respiratory: Negative for cough, chest tightness and shortness of breath.   Cardiovascular: Negative for chest pain, palpitations and leg swelling.  Gastrointestinal: Negative for nausea, vomiting, abdominal pain, constipation and blood in stool.       Regular heartburn--only uses the omeprazole prn  Endocrine: Negative for cold intolerance and heat intolerance.  Genitourinary: Positive for urgency and frequency. Negative for dysuria, hematuria and dyspareunia.  Musculoskeletal: Positive for back pain. Negative for joint swelling and arthralgias.       Episodic back pain  Skin: Negative for rash.       No suspicious lesions  Allergic/Immunologic: Negative for environmental allergies and immunocompromised state.  Neurological: Positive for headaches. Negative for dizziness, syncope, weakness, light-headedness and numbness.       Headache for 3 days after IUD--- better now.  Psychiatric/Behavioral: Positive for sleep disturbance. Negative for dysphoric  mood. The patient is not nervous/anxious.        Occasional sleep problems--- especially if she is hungry       Objective:   Physical Exam  Constitutional: She is oriented to person, place, and time. She appears well-developed and well-nourished. No distress.  HENT:  Head: Normocephalic and atraumatic.  Right Ear: External ear normal.  Left Ear: External ear normal.  Mouth/Throat: Oropharynx is clear and moist. No oropharyngeal exudate.  Eyes: Conjunctivae and EOM are normal. Pupils are equal, round, and reactive to light.  Neck: Normal range of motion. Neck supple. No thyromegaly present.  Cardiovascular: Normal rate, regular rhythm,  normal heart sounds and intact distal pulses.  Exam reveals no gallop.   No murmur heard. Pulmonary/Chest: Effort normal and breath sounds normal. No respiratory distress. She has no wheezes. She has no rales.  Abdominal: Soft. There is no tenderness.  Genitourinary:  Moderately cystic breasts bilaterally with mild tenderness No worrisome masses  Musculoskeletal: She exhibits no edema and no tenderness.  Lymphadenopathy:    She has no cervical adenopathy.    She has no axillary adenopathy.  Neurological: She is alert and oriented to person, place, and time.  Skin:  Mildly irritated 2-28mm skin tag on anterior upper neck  Psychiatric: She has a normal mood and affect. Her behavior is normal.          Assessment & Plan:

## 2012-11-10 LAB — CBC WITH DIFFERENTIAL/PLATELET
Basophils Absolute: 0 10*3/uL (ref 0.0–0.2)
Eosinophils Absolute: 0.2 10*3/uL (ref 0.0–0.4)
HCT: 42.4 % (ref 34.0–46.6)
Hemoglobin: 13.5 g/dL (ref 11.1–15.9)
Immature Granulocytes: 0 % (ref 0–2)
Lymphocytes Absolute: 1.4 10*3/uL (ref 0.7–3.1)
MCH: 25.8 pg — ABNORMAL LOW (ref 26.6–33.0)
MCHC: 31.8 g/dL (ref 31.5–35.7)
MCV: 81 fL (ref 79–97)
Monocytes Absolute: 0.4 10*3/uL (ref 0.1–0.9)
Monocytes: 9 % (ref 4–12)
Neutrophils Absolute: 2.5 10*3/uL (ref 1.4–7.0)
RDW: 15.8 % — ABNORMAL HIGH (ref 12.3–15.4)

## 2012-11-10 LAB — HEPATIC FUNCTION PANEL
ALT: 11 IU/L (ref 0–32)
AST: 12 IU/L (ref 0–40)
Alkaline Phosphatase: 126 IU/L — ABNORMAL HIGH (ref 39–117)
Bilirubin, Direct: 0.04 mg/dL (ref 0.00–0.40)

## 2012-11-10 LAB — BASIC METABOLIC PANEL
BUN/Creatinine Ratio: 12 (ref 9–23)
Calcium: 9.4 mg/dL (ref 8.7–10.2)
GFR calc non Af Amer: 71 mL/min/{1.73_m2} (ref >59)
Potassium: 3.8 mmol/L (ref 3.5–5.2)
Sodium: 140 mmol/L (ref 134–144)

## 2012-11-10 LAB — T4, FREE: Free T4: 1.03 ng/dL (ref 0.82–1.77)

## 2012-11-23 ENCOUNTER — Ambulatory Visit: Payer: 59 | Admitting: Obstetrics & Gynecology

## 2012-12-02 ENCOUNTER — Other Ambulatory Visit: Payer: Self-pay

## 2012-12-02 MED ORDER — TRIAMTERENE-HCTZ 37.5-25 MG PO TABS: 1.0000 | ORAL_TABLET | Freq: Every day | ORAL | Status: DC

## 2012-12-02 MED ORDER — LEVOTHYROXINE SODIUM 112 MCG PO TABS: 112.0000 ug | ORAL_TABLET | Freq: Every day | ORAL | Status: DC

## 2012-12-02 NOTE — Telephone Encounter (Signed)
Pt request refill levothyroxine and triamterene HCTZ to [facility]; pt had seen lab results and feels OK so does not want to change thyroid dosage. Advised pt done.

## 2012-12-28 ENCOUNTER — Ambulatory Visit: Payer: Self-pay | Admitting: Internal Medicine

## 2012-12-28 ENCOUNTER — Encounter: Payer: Self-pay | Admitting: Internal Medicine

## 2013-01-04 ENCOUNTER — Telehealth: Payer: Self-pay | Admitting: *Deleted

## 2013-01-04 DIAGNOSIS — B9689 Other specified bacterial agents as the cause of diseases classified elsewhere: Secondary | ICD-10-CM

## 2013-01-04 MED ORDER — METRONIDAZOLE 500 MG PO TABS: 500.0000 mg | ORAL_TABLET | Freq: Two times a day (BID) | ORAL | Status: DC

## 2013-01-04 NOTE — Telephone Encounter (Signed)
Patient would like a rx for metronidazole called into the pharmacy.  She is having increased  Vaginal discharge with odor.  She has had bv in the past.

## 2013-01-15 ENCOUNTER — Other Ambulatory Visit: Payer: Self-pay | Admitting: Family Medicine

## 2013-01-15 DIAGNOSIS — B9689 Other specified bacterial agents as the cause of diseases classified elsewhere: Secondary | ICD-10-CM

## 2013-01-19 ENCOUNTER — Encounter: Payer: Self-pay | Admitting: Internal Medicine

## 2013-01-19 NOTE — Telephone Encounter (Signed)
Please send new Rx for levothyroxine 1 year  Set up labs in 6 weeks---free T4 and TSH

## 2013-01-19 NOTE — Telephone Encounter (Signed)
Pt is a pt of [facility] office.  Kristina Gardner has taken care of

## 2013-01-20 MED ORDER — LEVOTHYROXINE SODIUM 125 MCG PO TABS: 125.0000 ug | ORAL_TABLET | Freq: Every day | ORAL | Status: DC

## 2013-01-20 NOTE — Telephone Encounter (Signed)
rx sent to pharmacy by e-script  

## 2013-01-24 NOTE — Telephone Encounter (Signed)
Was this taken care of?

## 2013-01-25 MED ORDER — LEVOTHYROXINE SODIUM 125 MCG PO TABS: 125.0000 ug | ORAL_TABLET | Freq: Every day | ORAL | Status: DC

## 2013-01-25 NOTE — Addendum Note (Signed)
Addended by: Sueanne Margarita on: 01/25/2013 11:08 AM   Modules accepted: Orders

## 2013-01-25 NOTE — Telephone Encounter (Signed)
Yes, new rx sent to [facility]

## 2013-01-27 ENCOUNTER — Encounter: Payer: Self-pay | Admitting: Obstetrics & Gynecology

## 2013-01-28 MED ORDER — FLUCONAZOLE 150 MG PO TABS: 150.0000 mg | ORAL_TABLET | Freq: Once | ORAL | Status: DC

## 2013-01-28 NOTE — Telephone Encounter (Signed)
Medication for yeast infection called into patients pharmacy.

## 2013-02-06 ENCOUNTER — Other Ambulatory Visit: Payer: Self-pay | Admitting: Internal Medicine

## 2013-03-08 ENCOUNTER — Encounter: Payer: Self-pay | Admitting: Obstetrics & Gynecology

## 2013-03-08 ENCOUNTER — Other Ambulatory Visit: Payer: Self-pay | Admitting: Family Medicine

## 2013-03-08 DIAGNOSIS — B9689 Other specified bacterial agents as the cause of diseases classified elsewhere: Secondary | ICD-10-CM

## 2013-03-08 MED ORDER — METRONIDAZOLE 500 MG PO TABS: 500.0000 mg | ORAL_TABLET | Freq: Two times a day (BID) | ORAL | Status: DC

## 2013-03-08 NOTE — Telephone Encounter (Signed)
meds called into pharmacy

## 2013-03-10 ENCOUNTER — Other Ambulatory Visit: Payer: Self-pay

## 2013-03-18 ENCOUNTER — Encounter: Payer: Self-pay | Admitting: Internal Medicine

## 2013-03-18 ENCOUNTER — Other Ambulatory Visit: Payer: Self-pay | Admitting: Family Medicine

## 2013-03-18 DIAGNOSIS — B379 Candidiasis, unspecified: Secondary | ICD-10-CM

## 2013-03-20 ENCOUNTER — Encounter: Payer: Self-pay | Admitting: Obstetrics & Gynecology

## 2013-03-23 MED ORDER — FLUCONAZOLE 150 MG PO TABS: 150.0000 mg | ORAL_TABLET | Freq: Once | ORAL | Status: DC

## 2013-04-22 ENCOUNTER — Other Ambulatory Visit: Payer: Self-pay

## 2013-04-22 MED ORDER — TRIAMTERENE-HCTZ 37.5-25 MG PO TABS: ORAL_TABLET | ORAL | Status: DC

## 2013-04-22 NOTE — Telephone Encounter (Signed)
Pt said due to insurance guidelines has to get maintenance meds filled thru [facility]; request refill triamterene hctz to [facility];advised pt done.

## 2013-06-05 ENCOUNTER — Encounter: Payer: Self-pay | Admitting: Obstetrics & Gynecology

## 2013-06-16 ENCOUNTER — Encounter: Payer: Self-pay | Admitting: Obstetrics & Gynecology

## 2013-06-17 ENCOUNTER — Telehealth: Payer: Self-pay

## 2013-06-17 NOTE — Telephone Encounter (Signed)
Patient called needing an antibiotic she has developed BV called it in to her pharmacy and set her up a follow up appointment.

## 2013-06-28 ENCOUNTER — Ambulatory Visit: Payer: 59 | Admitting: Obstetrics & Gynecology

## 2013-07-05 ENCOUNTER — Ambulatory Visit (INDEPENDENT_AMBULATORY_CARE_PROVIDER_SITE_OTHER): Payer: 59 | Admitting: Obstetrics & Gynecology

## 2013-07-05 ENCOUNTER — Encounter: Payer: Self-pay | Admitting: Obstetrics & Gynecology

## 2013-07-05 VITALS — BP 165/122 | HR 101 | Ht 66.0 in | Wt 211.0 lb

## 2013-07-05 DIAGNOSIS — Z0000 Encounter for general adult medical examination without abnormal findings: Secondary | ICD-10-CM

## 2013-07-05 DIAGNOSIS — Z124 Encounter for screening for malignant neoplasm of cervix: Secondary | ICD-10-CM

## 2013-07-05 DIAGNOSIS — Z01419 Encounter for gynecological examination (general) (routine) without abnormal findings: Secondary | ICD-10-CM

## 2013-07-05 DIAGNOSIS — Z1151 Encounter for screening for human papillomavirus (HPV): Secondary | ICD-10-CM

## 2013-07-05 NOTE — Progress Notes (Signed)
Subjective:    Kristina Gardner is a 43 y.o. female who presents for an annual exam. The patient has no complaints today. She has some spotting with her Mirena and at other times she will have a real period. The patient is sexually active. GYN screening history: last pap: was normal. The patient wears seatbelts: yes. The patient participates in regular exercise: no. Has the patient ever been transfused or tattooed?: no. The patient reports that there is not domestic violence in her life.   Menstrual History: OB History   Grav Para Term Preterm Abortions TAB SAB Ect Mult Living                  Menarche age: 43  No LMP recorded. Patient is not currently having periods (Reason: IUD).    The following portions of the patient's history were reviewed and updated as appropriate: allergies, current medications, past family history, past medical history, past social history, past surgical history and problem list.  Review of Systems Pertinent items are noted in HPI.   She is single, monogamous for 7 years, lives with boyfriend, works for [employer], declines flu vaccine Objective:    BP 165/122  Pulse 101  Ht 5\' 6"  (1.676 m)  Wt 211 lb (95.709 kg)  BMI 34.07 kg/m2  General Appearance:    Alert, cooperative, no distress, appears stated age  Head:    Normocephalic, without obvious abnormality, atraumatic  Eyes:    PERRL, conjunctiva/corneas clear, EOM's intact, fundi    benign, both eyes  Ears:    Normal TM's and external ear canals, both ears  Nose:   Nares normal, septum midline, mucosa normal, no drainage    or sinus tenderness  Throat:   Lips, mucosa, and tongue normal; teeth and gums normal  Neck:   Supple, symmetrical, trachea midline, no adenopathy;    thyroid:  no enlargement/tenderness/nodules; no carotid   bruit or JVD  Back:     Symmetric, no curvature, ROM normal, no CVA tenderness  Lungs:     Clear to auscultation bilaterally, respirations unlabored  Chest Wall:    No  tenderness or deformity   Heart:    Regular rate and rhythm, S1 and S2 normal, no murmur, rub   or gallop  Breast Exam:    No tenderness, masses, or nipple abnormality  Abdomen:     Soft, non-tender, bowel sounds active all four quadrants,    no masses, no organomegaly  Genitalia:    Normal female without lesion, discharge or tenderness, 18 week size uterus, adnexa without palpable mass     Extremities:   Extremities normal, atraumatic, no cyanosis or edema  Pulses:   2+ and symmetric all extremities  Skin:   Skin color, texture, turgor normal, no rashes or lesions  Lymph nodes:   Cervical, supraclavicular, and axillary nodes normal  Neurologic:   CNII-XII intact, normal strength, sensation and reflexes    throughout  .    Assessment:    Healthy female exam.  HTN   Plan:     Breast self exam technique reviewed and patient encouraged to perform self-exam monthly. Thin prep Pap smear. with cotesting Mammogram Strongly advised her to go to her Primary care MD regarding her HTN

## 2013-07-05 NOTE — Patient Instructions (Signed)
Fibroids Fibroids are lumps (tumors) that can occur any place in a woman's body. These lumps are not cancerous. Fibroids vary in size, weight, and where they grow. HOME CARE  Do not take aspirin.  Write down the number of pads or tampons you use during your period. Tell your doctor. This can help determine the best treatment for you. GET HELP RIGHT AWAY IF:  You have pain in your lower belly (abdomen) that is not helped with medicine.  You have cramps that are not helped with medicine.  You have more bleeding between or during your period.  You feel lightheaded or pass out (faint).  Your lower belly pain gets worse. MAKE SURE YOU:  Understand these instructions.  Will watch your condition.  Will get help right away if you are not doing well or get worse. Document Released: 05/24/2010 Document Revised: 07/14/2011 Document Reviewed: 05/24/2010 ExitCare Patient Information 2014 [employer].  

## 2013-07-06 ENCOUNTER — Telehealth: Payer: Self-pay | Admitting: Internal Medicine

## 2013-07-06 NOTE — Telephone Encounter (Signed)
Patient Information:  Caller Name: Kristina Gardner  Phone: [phone]  Patient: Kristina Gardner, Kristina Gardner  Gender: Female  DOB: 10-11-1970  Age: 43 Years  PCP: Viviana Simpler [facility])  Pregnant: No  Office Follow Up:  Does the office need to follow up with this patient?: No  Instructions For The Office: N/A  RN Note:  Declines appt time today. Scheduled 07/07/13 at 10:15 withprovider. Care advice and call back parameters reviewed . Understanding expressed.  Symptoms  Reason For Call & Symptoms: Patient states she went to OB/GYN yesterday and blood pressure was elevated.  She was advised to make appt with PCP. she does not know what her pressure was.  Reviewed EPIC 165/122.  Calling to set up appt.  She is currently taking Maxide-HCTZ and compliant.  No s/sx of HTN per patient.  Reviewed Health History In EMR: Yes  Reviewed Medications In EMR: Yes  Reviewed Allergies In EMR: Yes  Reviewed Surgeries / Procedures: Yes  Date of Onset of Symptoms: 07/05/2013 OB / GYN:  LMP: Unknown  Guideline(s) Used:  High Blood Pressure  Disposition Per Guideline:   See Within 2 Weeks in Office  Reason For Disposition Reached:   BP > 160/100  Advice Given:  Lifestyle Changes  Eat a diet high in fresh fruits and low-fat dairy products. Limit your intake of saturated and total fat. Choose foods that are lower in salt.  If you smoke, you should stop.  If you drink alcohol, you should limit your daily alcohol drinking. Women should have no more than one drink per day. Men should have no more than 2 drinks per day. A drink is defined as 1.5 oz hard liquor (one shot or jigger; 45 ml), 5 oz wine (small glass; 150 ml), or 12 oz beer (one can; 360 ml).  Call Back If:  Headache, blurred vision, difficulty talking, or difficulty walking occurs  Chest pain or difficulty breathing occurs  You become worse.  Limit:  Limit your sugar intake. Avoid high-calorie, low-nutrition foods like soft drinks and candy.  Limit foods high in saturated fat, trans-fat and cholesterol.  Limit alcoholic beverages to no more than one drink per day for women and no more than two drinks for men.  Choose:  Choose reduced-fat dairy skinless poultry and lean meats. These are healthier than red meat.  Choose fats with no more than two grams of saturated fat per tablespoon. Examples include liquid and tub margarine, canola, corn, safflower, and olive oil.  How Much Sodium (Salt) Should You Eat Each Day?  Aim to eat less than 1,500 mg of sodium each day.  Remember that one teaspoon of salt has 2,300 mg of sodium.  Unfortunately, 75% of the salt in the average person's diet is in pre-processed foods.  How To Reduce Your Sodium (Salt) Intake - DO NOT:  Buy or eat heavily salted foods. Examples include pickled foods, salted crackers or chips, and processed meats.  Add salt while cooking or at the table.  Patient Will Follow Care Advice:  YES  Appointment Scheduled:  07/07/2013 10:15:00 Appointment Scheduled Provider:  Webb Silversmith

## 2013-07-07 ENCOUNTER — Encounter: Payer: Self-pay | Admitting: Internal Medicine

## 2013-07-07 ENCOUNTER — Ambulatory Visit (INDEPENDENT_AMBULATORY_CARE_PROVIDER_SITE_OTHER): Payer: 59 | Admitting: Internal Medicine

## 2013-07-07 VITALS — BP 164/122 | HR 107 | Temp 98.1°F | Wt 208.0 lb

## 2013-07-07 DIAGNOSIS — I10 Essential (primary) hypertension: Secondary | ICD-10-CM

## 2013-07-07 DIAGNOSIS — E669 Obesity, unspecified: Secondary | ICD-10-CM

## 2013-07-07 MED ORDER — AMLODIPINE BESYLATE 5 MG PO TABS: 5.0000 mg | ORAL_TABLET | Freq: Every day | ORAL | Status: DC

## 2013-07-07 NOTE — Patient Instructions (Addendum)

## 2013-07-07 NOTE — Progress Notes (Signed)
Pre visit review using our clinic review tool, if applicable. No additional management support is needed unless otherwise documented below in the visit note. 

## 2013-07-07 NOTE — Progress Notes (Signed)
Subjective:    Patient ID: Kristina Gardner, female    DOB: 01/04/1971, 43 y.o.   MRN: [id number]  HPI  Pt presents to the clinic today with c/o elevated blood pressure. She reports she went to the GYN yesterday and her BP was 169/122.  She is on Maxide and is taking it daily.  She does c/o headache, but denies dizziness, chest pain or shortness of breath.  Review of Systems      Past Medical History  Diagnosis Date  . Hypertension   . Pneumonia   . GERD (gastroesophageal reflux disease)   . Leiomyoma of uterus, unspecified   . Unspecified hypothyroidism   . Obesity     Current Outpatient Prescriptions  Medication Sig Dispense Refill  . aspirin 81 MG tablet Take 81 mg by mouth daily.        Marland Kitchen levothyroxine (SYNTHROID, LEVOTHROID) 125 MCG tablet Take 1 tablet (125 mcg total) by mouth daily.  90 tablet  3  . omeprazole (PRILOSEC) 20 MG capsule Take 20 mg by mouth daily.        Marland Kitchen triamterene-hydrochlorothiazide (MAXZIDE-25) 37.5-25 MG per tablet TAKE 1 TABLET BY MOUTH DAILY.  90 tablet  2   No current facility-administered medications for this visit.    No Known Allergies  Family History  Problem Relation Age of Onset  . Hypertension Mother   . Cancer Mother     Bone Marrow   . Kidney disease Mother   . Arthritis Sister   . Cancer Paternal Grandfather     lung cancer    History   Social History  . Marital Status: Single    Spouse Name: N/A    Number of Children: 1  . Years of Education: N/A   Occupational History  . [employer], customer service    Social History Main Topics  . Smoking status: Never Smoker   . Smokeless tobacco: Never Used  . Alcohol Use: Yes     Comment: occasional  . Drug Use: No  . Sexual Activity: Yes    Partners: Male    Birth Control/ Protection: Condom, IUD   Other Topics Concern  . Not on file   Social History Narrative   Live in boyfriend since ~2008. He has 2 children-- don't stay with them anymore     Constitutional: Pt  reports headache. Denies fever, malaise, fatigue, or abrupt weight changes.  Respiratory: Denies difficulty breathing, shortness of breath, cough or sputum production.   Cardiovascular: Denies chest pain, chest tightness, palpitations or swelling in the hands or feet.  Neurological: Denies dizziness, difficulty with memory, difficulty with speech or problems with balance and coordination.   No other specific complaints in a complete review of systems (except as listed in HPI above).  Objective:   Physical Exam  BP 164/122  Pulse 107  Temp(Src) 98.1 F (36.7 C) (Oral)  Wt 208 lb (94.348 kg)  SpO2 98% Wt Readings from Last 3 Encounters:  07/07/13 208 lb (94.348 kg)  07/05/13 211 lb (95.709 kg)  11/09/12 198 lb (89.812 kg)    General: Appears her stated age, obese but well developed, well nourished in NAD.  Cardiovascular: Normal rate but tachycardic. S1,S2 noted.  No murmur, rubs or gallops noted. No JVD or BLE edema. No carotid bruits noted. Pulmonary/Chest: Normal effort and positive vesicular breath sounds. No respiratory distress. No wheezes, rales or ronchi noted.  Neurological: Alert and oriented. Cranial nerves II-XII intact. Coordination normal. +DTRs bilaterally.   BMET  Component Value Date/Time   NA 140 11/09/2012 1132   NA 137 04/24/2009 0000   K 3.8 11/09/2012 1132   CL 100 11/09/2012 1132   CO2 21 11/09/2012 1132   GLUCOSE 83 11/09/2012 1132   GLUCOSE 81 04/24/2009 0000   BUN 12 11/09/2012 1132   BUN 12 04/24/2009 0000   CREATININE 0.97 11/09/2012 1132   CALCIUM 9.4 11/09/2012 1132   GFRNONAA 71 11/09/2012 1132   GFRAA 82 11/09/2012 1132    Lipid Panel     Component Value Date/Time   TRIG 118 07/25/2011 0937   HDL 58 07/25/2011 0937   CHOLHDL 3.5 07/25/2011 0937   LDLCALC 121* 07/25/2011 0937    CBC    Component Value Date/Time   WBC 4.5 11/09/2012 1132   WBC 3.3* 11/11/2010 0815   RBC 5.23 11/09/2012 1132   RBC 5.07 11/11/2010 0815   HGB 13.5 11/09/2012 1132   HCT 42.4  11/09/2012 1132   PLT 413* 11/11/2010 0815   MCV 81 11/09/2012 1132   MCH 25.8* 11/09/2012 1132   MCH 20.9* 11/11/2010 0815   MCHC 31.8 11/09/2012 1132   MCHC 28.9* 11/11/2010 0815   RDW 15.8* 11/09/2012 1132   RDW 16.9* 11/11/2010 0815   LYMPHSABS 1.4 11/09/2012 1132   LYMPHSABS 1.5 04/24/2009 0000   MONOABS 0.8 04/24/2009 0000   EOSABS 0.2 11/09/2012 1132   EOSABS 0.1 04/24/2009 0000   BASOSABS 0.0 11/09/2012 1132   BASOSABS 0.0 04/24/2009 0000    Hgb A1C No results found for this basename: HGBA1C         Assessment & Plan:

## 2013-07-07 NOTE — Assessment & Plan Note (Signed)
Elevated today She is noncompliant with diet and exercise Will add amlodipine 5 mg daily Will check cbc, cmet, tsh and a1c today  RTC in 2 weeks for blood pressure check

## 2013-07-07 NOTE — Addendum Note (Signed)
Addended by: Royann Shivers A on: 07/07/2013 11:19 AM   Modules accepted: Orders

## 2013-07-08 ENCOUNTER — Telehealth: Payer: Self-pay | Admitting: Internal Medicine

## 2013-07-08 LAB — COMPREHENSIVE METABOLIC PANEL
ALBUMIN: 4.7 g/dL (ref 3.5–5.5)
ALT: 12 IU/L (ref 0–32)
AST: 15 IU/L (ref 0–40)
Albumin/Globulin Ratio: 1.4 (ref 1.1–2.5)
Alkaline Phosphatase: 126 IU/L — ABNORMAL HIGH (ref 39–117)
BUN / CREAT RATIO: 17 (ref 9–23)
BUN: 16 mg/dL (ref 6–24)
CO2: 21 mmol/L (ref 18–29)
Calcium: 9.9 mg/dL (ref 8.7–10.2)
Chloride: 96 mmol/L — ABNORMAL LOW (ref 97–108)
Creatinine, Ser: 0.93 mg/dL (ref 0.57–1.00)
GFR calc Af Amer: 86 mL/min/{1.73_m2} (ref >59)
GFR calc non Af Amer: 74 mL/min/{1.73_m2} (ref >59)
Globulin, Total: 3.3 g/dL (ref 1.5–4.5)
Glucose: 91 mg/dL (ref 65–99)
POTASSIUM: 4.1 mmol/L (ref 3.5–5.2)
SODIUM: 138 mmol/L (ref 134–144)
Total Bilirubin: 0.4 mg/dL (ref 0.0–1.2)
Total Protein: 8 g/dL (ref 6.0–8.5)

## 2013-07-08 LAB — CBC WITH DIFFERENTIAL/PLATELET
BASOS ABS: 0 10*3/uL (ref 0.0–0.2)
Basos: 1 %
Eos: 1 %
Eosinophils Absolute: 0.1 10*3/uL (ref 0.0–0.4)
HEMATOCRIT: 44.6 % (ref 34.0–46.6)
Hemoglobin: 14.9 g/dL (ref 11.1–15.9)
IMMATURE GRANULOCYTES: 0 %
Immature Grans (Abs): 0 10*3/uL (ref 0.0–0.1)
LYMPHS ABS: 1.3 10*3/uL (ref 0.7–3.1)
LYMPHS: 29 %
MCH: 27.7 pg (ref 26.6–33.0)
MCHC: 33.4 g/dL (ref 31.5–35.7)
MCV: 83 fL (ref 79–97)
MONOCYTES: 10 %
Monocytes Absolute: 0.5 10*3/uL (ref 0.1–0.9)
Neutrophils Absolute: 2.6 10*3/uL (ref 1.4–7.0)
Neutrophils Relative %: 59 %
RBC: 5.37 x10E6/uL — ABNORMAL HIGH (ref 3.77–5.28)
RDW: 14.7 % (ref 12.3–15.4)
WBC: 4.4 10*3/uL (ref 3.4–10.8)

## 2013-07-08 LAB — HEMOGLOBIN A1C
Est. average glucose Bld gHb Est-mCnc: 131 mg/dL
HEMOGLOBIN A1C: 6.2 % — AB (ref 4.8–5.6)

## 2013-07-08 LAB — TSH: TSH: 4.58 u[IU]/mL — ABNORMAL HIGH (ref 0.450–4.500)

## 2013-07-08 MED ORDER — LEVOTHYROXINE SODIUM 137 MCG PO TABS: 137.0000 ug | ORAL_TABLET | Freq: Every day | ORAL | Status: DC

## 2013-07-08 NOTE — Telephone Encounter (Signed)
Relevant patient education assigned to patient using Emmi. ° °

## 2013-07-08 NOTE — Addendum Note (Signed)
Addended by: Lurlean Nanny on: 07/08/2013 04:08 PM   Modules accepted: Orders

## 2013-07-15 ENCOUNTER — Other Ambulatory Visit: Payer: Self-pay | Admitting: *Deleted

## 2013-07-15 DIAGNOSIS — B379 Candidiasis, unspecified: Secondary | ICD-10-CM

## 2013-07-15 MED ORDER — FLUCONAZOLE 150 MG PO TABS: 150.0000 mg | ORAL_TABLET | Freq: Once | ORAL | Status: DC

## 2013-07-15 NOTE — Telephone Encounter (Signed)
Pt called and is having symptoms of a yeast infection.  I have sent in Diflucan to her pharmacy.  Pt aware.  

## 2013-07-21 ENCOUNTER — Encounter: Payer: Self-pay | Admitting: Internal Medicine

## 2013-07-21 ENCOUNTER — Ambulatory Visit (INDEPENDENT_AMBULATORY_CARE_PROVIDER_SITE_OTHER): Payer: 59 | Admitting: Internal Medicine

## 2013-07-21 VITALS — BP 130/90 | HR 92 | Temp 98.1°F | Wt 204.0 lb

## 2013-07-21 DIAGNOSIS — I10 Essential (primary) hypertension: Secondary | ICD-10-CM

## 2013-07-21 DIAGNOSIS — E890 Postprocedural hypothyroidism: Secondary | ICD-10-CM

## 2013-07-21 NOTE — Progress Notes (Signed)
   Subjective:    Patient ID: Kristina Gardner, female    DOB: 1970-08-03, 43 y.o.   MRN: [id number]  HPI She generally doesn't notice her BP is high Did have some headaches Sent here and started amlodipine  Doing okay on this No dizziness No chest pain or SOB  Has cut out sodas and fried food Walking every day  Current Outpatient Prescriptions on File Prior to Visit  Medication Sig Dispense Refill  . amLODipine (NORVASC) 5 MG tablet Take 1 tablet (5 mg total) by mouth daily.  30 tablet  0  . aspirin 81 MG tablet Take 81 mg by mouth daily.        Marland Kitchen levothyroxine (SYNTHROID) 137 MCG tablet Take 1 tablet (137 mcg total) by mouth daily before breakfast.  30 tablet  0  . omeprazole (PRILOSEC) 20 MG capsule Take 20 mg by mouth daily.        Marland Kitchen triamterene-hydrochlorothiazide (MAXZIDE-25) 37.5-25 MG per tablet TAKE 1 TABLET BY MOUTH DAILY.  90 tablet  2   No current facility-administered medications on file prior to visit.    No Known Allergies  Past Medical History  Diagnosis Date  . Hypertension   . Pneumonia   . GERD (gastroesophageal reflux disease)   . Leiomyoma of uterus, unspecified   . Unspecified hypothyroidism   . Obesity     Past Surgical History  Procedure Laterality Date  . Dilation and curettage of uterus  2000    Family History  Problem Relation Age of Onset  . Hypertension Mother   . Cancer Mother     Bone Marrow   . Kidney disease Mother   . Arthritis Sister   . Cancer Paternal Grandfather     lung cancer    History   Social History  . Marital Status: Single    Spouse Name: N/A    Number of Children: 1  . Years of Education: N/A   Occupational History  . [employer], customer service    Social History Main Topics  . Smoking status: Never Smoker   . Smokeless tobacco: Never Used  . Alcohol Use: Yes     Comment: occasional  . Drug Use: No  . Sexual Activity: Yes    Partners: Male    Birth Control/ Protection: Condom, IUD   Other Topics  Concern  . Not on file   Social History Narrative   Live in boyfriend since ~2008. He has 2 children-- don't stay with them anymore   Review of Systems Appetite is fine Weight is down 4# already     Objective:   Physical Exam  Constitutional: She appears well-developed and well-nourished. No distress.  Neck: Normal range of motion. Neck supple. No thyromegaly present.  Cardiovascular: Normal rate, regular rhythm and normal heart sounds.  Exam reveals no gallop.   No murmur heard. Pulmonary/Chest: Effort normal and breath sounds normal. No respiratory distress. She has no wheezes. She has no rales.  Musculoskeletal: She exhibits no edema.  Lymphadenopathy:    She has no cervical adenopathy.  Psychiatric: She has a normal mood and affect. Her behavior is normal.          Assessment & Plan:

## 2013-07-21 NOTE — Assessment & Plan Note (Signed)
BP Readings from Last 3 Encounters:  07/21/13 130/90  07/07/13 164/122  07/05/13 165/122   Better now Doing more for lifestyle Will continue on the current meds

## 2013-07-21 NOTE — Assessment & Plan Note (Signed)
Dose adjusted slightly Okay to just recheck in July

## 2013-07-21 NOTE — Progress Notes (Signed)
Pre visit review using our clinic review tool, if applicable. No additional management support is needed unless otherwise documented below in the visit note. 

## 2013-07-21 NOTE — Patient Instructions (Signed)
DASH Diet  The DASH diet stands for "Dietary Approaches to Stop Hypertension." It is a healthy eating plan that has been shown to reduce high blood pressure (hypertension) in as little as 14 days, while also possibly providing other significant health benefits. These other health benefits include reducing the risk of breast cancer after menopause and reducing the risk of type 2 diabetes, heart disease, colon cancer, and stroke. Health benefits also include weight loss and slowing kidney failure in patients with chronic kidney disease.   DIET GUIDELINES  · Limit salt (sodium). Your diet should contain less than 1500 mg of sodium daily.  · Limit refined or processed carbohydrates. Your diet should include mostly whole grains. Desserts and added sugars should be used sparingly.  · Include small amounts of heart-healthy fats. These types of fats include nuts, oils, and tub margarine. Limit saturated and trans fats. These fats have been shown to be harmful in the body.  CHOOSING FOODS   The following food groups are based on a 2000 calorie diet. See your Registered Dietitian for individual calorie needs.  Grains and Grain Products (6 to 8 servings daily)  · Eat More Often: Whole-wheat bread, brown rice, whole-grain or wheat pasta, quinoa, popcorn without added fat or salt (air popped).  · Eat Less Often: White bread, white pasta, white rice, cornbread.  Vegetables (4 to 5 servings daily)  · Eat More Often: Fresh, frozen, and canned vegetables. Vegetables may be raw, steamed, roasted, or grilled with a minimal amount of fat.  · Eat Less Often/Avoid: Creamed or fried vegetables. Vegetables in a cheese sauce.  Fruit (4 to 5 servings daily)  · Eat More Often: All fresh, canned (in natural juice), or frozen fruits. Dried fruits without added sugar. One hundred percent fruit juice (½ cup [237 mL] daily).  · Eat Less Often: Dried fruits with added sugar. Canned fruit in light or heavy syrup.  Lean Meats, Fish, and Poultry (2  servings or less daily. One serving is 3 to 4 oz [85-114 g]).  · Eat More Often: Ninety percent or leaner ground beef, tenderloin, sirloin. Round cuts of beef, chicken breast, turkey breast. All fish. Grill, bake, or broil your meat. Nothing should be fried.  · Eat Less Often/Avoid: Fatty cuts of meat, turkey, or chicken leg, thigh, or wing. Fried cuts of meat or fish.  Dairy (2 to 3 servings)  · Eat More Often: Low-fat or fat-free milk, low-fat plain or light yogurt, reduced-fat or part-skim cheese.  · Eat Less Often/Avoid: Milk (whole, 2%). Whole milk yogurt. Full-fat cheeses.  Nuts, Seeds, and Legumes (4 to 5 servings per week)  · Eat More Often: All without added salt.  · Eat Less Often/Avoid: Salted nuts and seeds, canned beans with added salt.  Fats and Sweets (limited)  · Eat More Often: Vegetable oils, tub margarines without trans fats, sugar-free gelatin. Mayonnaise and salad dressings.  · Eat Less Often/Avoid: Coconut oils, palm oils, butter, stick margarine, cream, half and half, cookies, candy, pie.  FOR MORE INFORMATION  The Dash Diet Eating Plan: www.dashdiet.org  Document Released: 04/10/2011 Document Revised: 07/14/2011 Document Reviewed: 04/10/2011  ExitCare® Patient Information ©2014 [employer].

## 2013-07-25 ENCOUNTER — Other Ambulatory Visit: Payer: Self-pay | Admitting: Obstetrics & Gynecology

## 2013-08-04 ENCOUNTER — Other Ambulatory Visit: Payer: Self-pay | Admitting: Internal Medicine

## 2013-09-08 ENCOUNTER — Other Ambulatory Visit: Payer: Self-pay

## 2013-09-08 MED ORDER — AMLODIPINE BESYLATE 5 MG PO TABS: 5.0000 mg | ORAL_TABLET | Freq: Every day | ORAL | Status: DC

## 2013-09-08 MED ORDER — LEVOTHYROXINE SODIUM 137 MCG PO TABS: 137.0000 ug | ORAL_TABLET | Freq: Every day | ORAL | Status: DC

## 2013-09-08 NOTE — Telephone Encounter (Signed)
Pt request refill for amlodipine and levothyroxine [facility]; Dr Silvio Pate made note in last visit pt can wait until 11/2013 appt before retest thyroid. Pt notified done and refills at [place].

## 2013-09-21 ENCOUNTER — Other Ambulatory Visit: Payer: Self-pay | Admitting: *Deleted

## 2013-09-21 MED ORDER — AMLODIPINE BESYLATE 5 MG PO TABS: 5.0000 mg | ORAL_TABLET | Freq: Every day | ORAL | Status: DC

## 2013-10-27 ENCOUNTER — Telehealth: Payer: Self-pay | Admitting: *Deleted

## 2013-10-27 MED ORDER — METRONIDAZOLE 500 MG PO TABS: ORAL_TABLET | ORAL | Status: DC

## 2013-10-27 NOTE — Telephone Encounter (Signed)
Patient is requesting a refill of flagyl for BV.

## 2013-11-11 ENCOUNTER — Encounter: Payer: Self-pay | Admitting: *Deleted

## 2013-11-11 ENCOUNTER — Ambulatory Visit (INDEPENDENT_AMBULATORY_CARE_PROVIDER_SITE_OTHER): Payer: 59 | Admitting: Internal Medicine

## 2013-11-11 ENCOUNTER — Encounter: Payer: Self-pay | Admitting: Internal Medicine

## 2013-11-11 VITALS — BP 120/88 | HR 91 | Temp 98.5°F | Ht 67.0 in | Wt 203.0 lb

## 2013-11-11 DIAGNOSIS — I10 Essential (primary) hypertension: Secondary | ICD-10-CM

## 2013-11-11 DIAGNOSIS — E890 Postprocedural hypothyroidism: Secondary | ICD-10-CM

## 2013-11-11 DIAGNOSIS — K219 Gastro-esophageal reflux disease without esophagitis: Secondary | ICD-10-CM

## 2013-11-11 DIAGNOSIS — Z23 Encounter for immunization: Secondary | ICD-10-CM

## 2013-11-11 DIAGNOSIS — Z0000 Encounter for general adult medical examination without abnormal findings: Secondary | ICD-10-CM

## 2013-11-11 NOTE — Assessment & Plan Note (Signed)
Will recheck Will accept mild undertreatment

## 2013-11-11 NOTE — Progress Notes (Signed)
Pre visit review using our clinic review tool, if applicable. No additional management support is needed unless otherwise documented below in the visit note. 

## 2013-11-11 NOTE — Assessment & Plan Note (Signed)
BP Readings from Last 3 Encounters:  11/11/13 120/88  07/21/13 130/90  07/07/13 164/122   Good control Lab Results  Component Value Date   CREATININE 0.93 07/07/2013

## 2013-11-11 NOTE — Progress Notes (Signed)
Subjective:    Patient ID: Kristina Gardner, female    DOB: [birth year], 43 y.o.   MRN: [id number]  HPI Here for physical Doing okay except for nagging headaches Takes meds only intermittently Awakens with them at times---sleeps okay (uses several pillows--discussed trying just 1) Discussed computer position at work, etc  Reviewed preventative care BP has been okay  Had some "panic attacks" on the higher dose of thyroid Anxious, shaky, restless at work [employer] back to the lower dose  Current Outpatient Prescriptions on File Prior to Visit  Medication Sig Dispense Refill  . amLODipine (NORVASC) 5 MG tablet Take 1 tablet (5 mg total) by mouth daily.  90 tablet  3  . aspirin 81 MG tablet Take 81 mg by mouth daily.        . metroNIDAZOLE (FLAGYL) 500 MG tablet TAKE 1 TABLET BY MOUTH TWICE A DAY  14 tablet  0  . omeprazole (PRILOSEC) 20 MG capsule Take 20 mg by mouth daily.        Marland Kitchen triamterene-hydrochlorothiazide (MAXZIDE-25) 37.5-25 MG per tablet TAKE 1 TABLET BY MOUTH DAILY.  90 tablet  2   No current facility-administered medications on file prior to visit.    No Known Allergies  Past Medical History  Diagnosis Date  . Hypertension   . Pneumonia   . GERD (gastroesophageal reflux disease)   . Leiomyoma of uterus, unspecified   . Unspecified hypothyroidism   . Obesity     Past Surgical History  Procedure Laterality Date  . Dilation and curettage of uterus  2000    Family History  Problem Relation Age of Onset  . Hypertension Mother   . Cancer Mother     Bone Marrow   . Kidney disease Mother   . Arthritis Sister   . Cancer Paternal Grandfather     lung cancer    History   Social History  . Marital Status: Single    Spouse Name: N/A    Number of Children: 1  . Years of Education: N/A   Occupational History  . [employer], customer service    Social History Main Topics  . Smoking status: Never Smoker   . Smokeless tobacco: Never Used  . Alcohol Use: Yes       Comment: occasional  . Drug Use: No  . Sexual Activity: Yes    Partners: Male    Birth Control/ Protection: Condom, IUD   Other Topics Concern  . Not on file   Social History Narrative   Live in boyfriend since ~2008. He has 2 children-- don't stay with them anymore   Review of Systems  Constitutional: Negative for fatigue and unexpected weight change.       Has tried the [employer]-- 3 day bursts Wears seat belt  HENT: Negative for dental problem, hearing loss and tinnitus.        Regular with dentist  Eyes: Negative for visual disturbance.       No diplopia or unilateral vision loss  Respiratory: Negative for cough, chest tightness and shortness of breath.   Cardiovascular: Negative for chest pain, palpitations and leg swelling.  Gastrointestinal: Negative for nausea, vomiting, abdominal pain, constipation and blood in stool.       Occasional heartburn-- uses omeprazole rarely  Endocrine: Positive for heat intolerance. Negative for cold intolerance.  Genitourinary: Negative for dysuria, hematuria, difficulty urinating and dyspareunia.       Chronic irregular periods  Musculoskeletal: Positive for arthralgias. Negative for back pain  and joint swelling.       Occasional catch in left knee Injured left 3rd PIP---slowly improving  Skin: Negative for rash.       No suspicious lesions--does have "moles" under arms. brastrap knocked one off--sounds like skin tags  Allergic/Immunologic: Positive for environmental allergies. Negative for immunocompromised state.       Mostly to dust  Neurological: Positive for headaches. Negative for dizziness, syncope, weakness, light-headedness and numbness.  Hematological: Negative for adenopathy. Does not bruise/bleed easily.  Psychiatric/Behavioral: Positive for dysphoric mood. Negative for sleep disturbance. The patient is not nervous/anxious.        Sleeps 6 hours every night--fairly refreshed Will have spells of crying at times--?due to  mom's birthday       Objective:   Physical Exam  Constitutional: She is oriented to person, place, and time. She appears well-developed and well-nourished. No distress.  HENT:  Head: Normocephalic and atraumatic.  Right Ear: External ear normal.  Left Ear: External ear normal.  Mouth/Throat: Oropharynx is clear and moist. No oropharyngeal exudate.  Eyes: Conjunctivae and EOM are normal. Pupils are equal, round, and reactive to light.  Neck: Normal range of motion. Neck supple. No thyromegaly present.  Cardiovascular: Normal rate, regular rhythm, normal heart sounds and intact distal pulses.  Exam reveals no gallop.   No murmur heard. Pulmonary/Chest: Effort normal and breath sounds normal. No respiratory distress. She has no wheezes. She has no rales.  Abdominal: Soft. There is no tenderness.  Genitourinary:  Dense breasts with mild cystic changes  Musculoskeletal: She exhibits no edema and no tenderness.  Lymphadenopathy:    She has no cervical adenopathy.  Neurological: She is alert and oriented to person, place, and time.  Skin: No rash noted. No erythema.  Psychiatric: She has a normal mood and affect. Her behavior is normal.          Assessment & Plan:

## 2013-11-11 NOTE — Assessment & Plan Note (Signed)
Okay with prn med

## 2013-11-11 NOTE — Assessment & Plan Note (Signed)
Discussed healthy behaviors Yearly mammograms till menopause

## 2013-11-11 NOTE — Addendum Note (Signed)
Addended by: Despina Hidden on: 11/11/2013 09:51 AM   Modules accepted: Orders

## 2013-11-11 NOTE — Patient Instructions (Signed)
Exercise to Lose Weight Exercise and a healthy diet may help you lose weight. Your doctor may suggest specific exercises. EXERCISE IDEAS AND TIPS  Choose low-cost things you enjoy doing, such as walking, bicycling, or exercising to workout videos.  Take stairs instead of the elevator.  Walk during your lunch break.  Park your car further away from work or school.  Go to a gym or an exercise class.  Start with 5 to 10 minutes of exercise each day. Build up to 30 minutes of exercise 4 to 6 days a week.  Wear shoes with good support and comfortable clothes.  Stretch before and after working out.  Work out until you breathe harder and your heart beats faster.  Drink extra water when you exercise.  Do not do so much that you hurt yourself, feel dizzy, or get very short of breath. Exercises that burn about 150 calories:  Running 1  miles in 15 minutes.  Playing volleyball for 45 to 60 minutes.  Washing and waxing a car for 45 to 60 minutes.  Playing touch football for 45 minutes.  Walking 1  miles in 35 minutes.  Pushing a stroller 1  miles in 30 minutes.  Playing basketball for 30 minutes.  Raking leaves for 30 minutes.  Bicycling 5 miles in 30 minutes.  Walking 2 miles in 30 minutes.  Dancing for 30 minutes.  Shoveling snow for 15 minutes.  Swimming laps for 20 minutes.  Walking up stairs for 15 minutes.  Bicycling 4 miles in 15 minutes.  Gardening for 30 to 45 minutes.  Jumping rope for 15 minutes.  Washing windows or floors for 45 to 60 minutes. Document Released: 05/24/2010 Document Revised: 07/14/2011 Document Reviewed: 05/24/2010 [facility] Patient Information 2015 [place], [place]. This information is not intended to replace advice given to you by your health care provider. Make sure you discuss any questions you have with your health care provider. DASH Eating Plan DASH stands for "Dietary Approaches to Stop Hypertension." The DASH eating plan is a  healthy eating plan that has been shown to reduce high blood pressure (hypertension). Additional health benefits may include reducing the risk of type 2 diabetes mellitus, heart disease, and stroke. The DASH eating plan may also help with weight loss. WHAT DO I NEED TO KNOW ABOUT THE DASH EATING PLAN? For the DASH eating plan, you will follow these general guidelines:  Choose foods with a percent daily value for sodium of less than 5% (as listed on the food label).  Use salt-free seasonings or herbs instead of table salt or sea salt.  Check with your health care provider or pharmacist before using salt substitutes.  Eat lower-sodium products, often labeled as "lower sodium" or "no salt added."  Eat fresh foods.  Eat more vegetables, fruits, and low-fat dairy products.  Choose whole grains. Look for the word "whole" as the first word in the ingredient list.  Choose fish and skinless chicken or [place] more often than red meat. Limit fish, poultry, and meat to 6 oz (170 g) each day.  Limit sweets, desserts, sugars, and sugary drinks.  Choose heart-healthy fats.  Limit cheese to 1 oz (28 g) per day.  Eat more home-cooked food and less restaurant, buffet, and fast food.  Limit fried foods.  Cook foods using methods other than frying.  Limit canned vegetables. If you do use them, rinse them well to decrease the sodium.  When eating at a restaurant, ask that your food be prepared with  less salt, or no salt if possible. WHAT FOODS CAN I EAT? Seek help from a dietitian for individual calorie needs. Grains Whole grain or whole wheat bread. Brown rice. Whole grain or whole wheat pasta. Quinoa, bulgur, and whole grain cereals. Low-sodium cereals. Corn or whole wheat flour tortillas. Whole grain cornbread. Whole grain crackers. Low-sodium crackers. Vegetables Fresh or frozen vegetables (raw, steamed, roasted, or grilled). Low-sodium or reduced-sodium tomato and vegetable juices. Low-sodium  or reduced-sodium tomato sauce and paste. Low-sodium or reduced-sodium canned vegetables.  Fruits All fresh, canned (in natural juice), or frozen fruits. Meat and Other Protein Products Ground beef (85% or leaner), grass-fed beef, or beef trimmed of fat. Skinless chicken or [place]. Ground chicken or [place]. Pork trimmed of fat. All fish and seafood. Eggs. Dried beans, peas, or lentils. Unsalted nuts and seeds. Unsalted canned beans. Dairy Low-fat dairy products, such as skim or 1% milk, 2% or reduced-fat cheeses, low-fat ricotta or cottage cheese, or plain low-fat yogurt. Low-sodium or reduced-sodium cheeses. Fats and Oils Tub margarines without trans fats. Light or reduced-fat mayonnaise and salad dressings (reduced sodium). Avocado. Safflower, olive, or canola oils. Natural peanut or almond butter. Other Unsalted popcorn and pretzels. The items listed above may not be a complete list of recommended foods or beverages. Contact your dietitian for more options. WHAT FOODS ARE NOT RECOMMENDED? Grains White bread. White pasta. White rice. Refined cornbread. Bagels and croissants. Crackers that contain trans fat. Vegetables Creamed or fried vegetables. Vegetables in a cheese sauce. Regular canned vegetables. Regular canned tomato sauce and paste. Regular tomato and vegetable juices. Fruits Dried fruits. Canned fruit in light or heavy syrup. Fruit juice. Meat and Other Protein Products Fatty cuts of meat. Ribs, chicken wings, bacon, sausage, bologna, salami, chitterlings, fatback, hot dogs, bratwurst, and packaged luncheon meats. Salted nuts and seeds. Canned beans with salt. Dairy Whole or 2% milk, cream, half-and-half, and cream cheese. Whole-fat or sweetened yogurt. Full-fat cheeses or blue cheese. Nondairy creamers and whipped toppings. Processed cheese, cheese spreads, or cheese curds. Condiments Onion and garlic salt, seasoned salt, table salt, and sea salt. Canned and packaged gravies.  Worcestershire sauce. Tartar sauce. Barbecue sauce. Teriyaki sauce. Soy sauce, including reduced sodium. Steak sauce. Fish sauce. Oyster sauce. Cocktail sauce. Horseradish. Ketchup and mustard. Meat flavorings and tenderizers. Bouillon cubes. Hot sauce. Tabasco sauce. Marinades. Taco seasonings. Relishes. Fats and Oils Butter, stick margarine, lard, shortening, ghee, and bacon fat. Coconut, palm kernel, or palm oils. Regular salad dressings. Other Pickles and olives. Salted popcorn and pretzels. The items listed above may not be a complete list of foods and beverages to avoid. Contact your dietitian for more information. WHERE CAN I FIND MORE INFORMATION? National Heart, Lung, and Blood Institute: travelstabloid.com Document Released: 04/10/2011 Document Revised: 04/26/2013 Document Reviewed: 02/23/2013 [facility] Patient Information 2015 [place], [place]. This information is not intended to replace advice given to you by your health care provider. Make sure you discuss any questions you have with your health care provider.

## 2013-11-12 LAB — TSH: TSH: 1.88 u[IU]/mL (ref 0.450–4.500)

## 2013-11-12 LAB — T4, FREE: Free T4: 1.48 ng/dL (ref 0.82–1.77)

## 2013-11-25 ENCOUNTER — Telehealth: Payer: Self-pay | Admitting: *Deleted

## 2013-11-25 DIAGNOSIS — B9689 Other specified bacterial agents as the cause of diseases classified elsewhere: Secondary | ICD-10-CM

## 2013-11-25 DIAGNOSIS — N760 Acute vaginitis: Principal | ICD-10-CM

## 2013-11-25 MED ORDER — METRONIDAZOLE 500 MG PO TABS: 500.0000 mg | ORAL_TABLET | Freq: Two times a day (BID) | ORAL | Status: DC

## 2013-11-25 NOTE — Telephone Encounter (Signed)
Patient called and has bacterial vaginosis.  Patient gets frequently.  I have sent in a prescription to patients pharmacy.  Patient aware.

## 2013-12-13 ENCOUNTER — Other Ambulatory Visit: Payer: Self-pay | Admitting: Internal Medicine

## 2013-12-14 ENCOUNTER — Other Ambulatory Visit: Payer: Self-pay | Admitting: *Deleted

## 2013-12-14 MED ORDER — LEVOTHYROXINE SODIUM 125 MCG PO TABS: 125.0000 ug | ORAL_TABLET | Freq: Every day | ORAL | Status: DC

## 2014-01-06 ENCOUNTER — Telehealth: Payer: Self-pay | Admitting: *Deleted

## 2014-01-06 MED ORDER — FLUCONAZOLE 150 MG PO TABS: ORAL_TABLET | ORAL | Status: DC

## 2014-01-06 NOTE — Telephone Encounter (Signed)
Patient is having a yeast infection and would like medication called in for this.

## 2014-01-18 ENCOUNTER — Other Ambulatory Visit: Payer: Self-pay | Admitting: Family Medicine

## 2014-01-18 DIAGNOSIS — B9689 Other specified bacterial agents as the cause of diseases classified elsewhere: Secondary | ICD-10-CM

## 2014-01-18 DIAGNOSIS — N760 Acute vaginitis: Principal | ICD-10-CM

## 2014-01-18 MED ORDER — METRONIDAZOLE 500 MG PO TABS: 500.0000 mg | ORAL_TABLET | Freq: Two times a day (BID) | ORAL | Status: DC

## 2014-01-18 NOTE — Telephone Encounter (Signed)
Patient needs refill of metronidazole.

## 2014-02-07 ENCOUNTER — Ambulatory Visit (INDEPENDENT_AMBULATORY_CARE_PROVIDER_SITE_OTHER): Payer: 59 | Admitting: Internal Medicine

## 2014-02-07 ENCOUNTER — Encounter: Payer: Self-pay | Admitting: Internal Medicine

## 2014-02-07 VITALS — BP 124/82 | HR 64 | Temp 98.4°F | Resp 14 | Wt 210.2 lb

## 2014-02-07 DIAGNOSIS — E669 Obesity, unspecified: Secondary | ICD-10-CM

## 2014-02-07 NOTE — Patient Instructions (Signed)
DASH Eating Plan DASH stands for "Dietary Approaches to Stop Hypertension." The DASH eating plan is a healthy eating plan that has been shown to reduce high blood pressure (hypertension). Additional health benefits may include reducing the risk of type 2 diabetes mellitus, heart disease, and stroke. The DASH eating plan may also help with weight loss. WHAT DO I NEED TO KNOW ABOUT THE DASH EATING PLAN? For the DASH eating plan, you will follow these general guidelines:  Choose foods with a percent daily value for sodium of less than 5% (as listed on the food label).  Use salt-free seasonings or herbs instead of table salt or sea salt.  Check with your health care provider or pharmacist before using salt substitutes.  Eat lower-sodium products, often labeled as "lower sodium" or "no salt added."  Eat fresh foods.  Eat more vegetables, fruits, and low-fat dairy products.  Choose whole grains. Look for the word "whole" as the first word in the ingredient list.  Choose fish and skinless chicken or turkey more often than red meat. Limit fish, poultry, and meat to 6 oz (170 g) each day.  Limit sweets, desserts, sugars, and sugary drinks.  Choose heart-healthy fats.  Limit cheese to 1 oz (28 g) per day.  Eat more home-cooked food and less restaurant, buffet, and fast food.  Limit fried foods.  Cook foods using methods other than frying.  Limit canned vegetables. If you do use them, rinse them well to decrease the sodium.  When eating at a restaurant, ask that your food be prepared with less salt, or no salt if possible. WHAT FOODS CAN I EAT? Seek help from a dietitian for individual calorie needs. Grains Whole grain or whole wheat bread. Brown rice. Whole grain or whole wheat pasta. Quinoa, bulgur, and whole grain cereals. Low-sodium cereals. Corn or whole wheat flour tortillas. Whole grain cornbread. Whole grain crackers. Low-sodium crackers. Vegetables Fresh or frozen vegetables  (raw, steamed, roasted, or grilled). Low-sodium or reduced-sodium tomato and vegetable juices. Low-sodium or reduced-sodium tomato sauce and paste. Low-sodium or reduced-sodium canned vegetables.  Fruits All fresh, canned (in natural juice), or frozen fruits. Meat and Other Protein Products Ground beef (85% or leaner), grass-fed beef, or beef trimmed of fat. Skinless chicken or turkey. Ground chicken or turkey. Pork trimmed of fat. All fish and seafood. Eggs. Dried beans, peas, or lentils. Unsalted nuts and seeds. Unsalted canned beans. Dairy Low-fat dairy products, such as skim or 1% milk, 2% or reduced-fat cheeses, low-fat ricotta or cottage cheese, or plain low-fat yogurt. Low-sodium or reduced-sodium cheeses. Fats and Oils Tub margarines without trans fats. Light or reduced-fat mayonnaise and salad dressings (reduced sodium). Avocado. Safflower, olive, or canola oils. Natural peanut or almond butter. Other Unsalted popcorn and pretzels. The items listed above may not be a complete list of recommended foods or beverages. Contact your dietitian for more options. WHAT FOODS ARE NOT RECOMMENDED? Grains White bread. White pasta. White rice. Refined cornbread. Bagels and croissants. Crackers that contain trans fat. Vegetables Creamed or fried vegetables. Vegetables in a cheese sauce. Regular canned vegetables. Regular canned tomato sauce and paste. Regular tomato and vegetable juices. Fruits Dried fruits. Canned fruit in light or heavy syrup. Fruit juice. Meat and Other Protein Products Fatty cuts of meat. Ribs, chicken wings, bacon, sausage, bologna, salami, chitterlings, fatback, hot dogs, bratwurst, and packaged luncheon meats. Salted nuts and seeds. Canned beans with salt. Dairy Whole or 2% milk, cream, half-and-half, and cream cheese. Whole-fat or sweetened yogurt. Full-fat   cheeses or blue cheese. Nondairy creamers and whipped toppings. Processed cheese, cheese spreads, or cheese  curds. Condiments Onion and garlic salt, seasoned salt, table salt, and sea salt. Canned and packaged gravies. Worcestershire sauce. Tartar sauce. Barbecue sauce. Teriyaki sauce. Soy sauce, including reduced sodium. Steak sauce. Fish sauce. Oyster sauce. Cocktail sauce. Horseradish. Ketchup and mustard. Meat flavorings and tenderizers. Bouillon cubes. Hot sauce. Tabasco sauce. Marinades. Taco seasonings. Relishes. Fats and Oils Butter, stick margarine, lard, shortening, ghee, and bacon fat. Coconut, palm kernel, or palm oils. Regular salad dressings. Other Pickles and olives. Salted popcorn and pretzels. The items listed above may not be a complete list of foods and beverages to avoid. Contact your dietitian for more information. WHERE CAN I FIND MORE INFORMATION? National Heart, Lung, and Blood Institute: www.nhlbi.nih.gov/health/health-topics/topics/dash/ Document Released: 04/10/2011 Document Revised: 09/05/2013 Document Reviewed: 02/23/2013 [employer] Patient Information 2015 [employer]. This information is not intended to replace advice given to you by your health care provider. Make sure you discuss any questions you have with your health care provider. Exercise to Lose Weight Exercise and a healthy diet may help you lose weight. Your doctor may suggest specific exercises. EXERCISE IDEAS AND TIPS  Choose low-cost things you enjoy doing, such as walking, bicycling, or exercising to workout videos.  Take stairs instead of the elevator.  Walk during your lunch break.  Park your car further away from work or school.  Go to a gym or an exercise class.  Start with 5 to 10 minutes of exercise each day. Build up to 30 minutes of exercise 4 to 6 days a week.  Wear shoes with good support and comfortable clothes.  Stretch before and after working out.  Work out until you breathe harder and your heart beats faster.  Drink extra water when you exercise.  Do not do so much that you  hurt yourself, feel dizzy, or get very short of breath. Exercises that burn about 150 calories:  Running 1  miles in 15 minutes.  Playing volleyball for 45 to 60 minutes.  Washing and waxing a car for 45 to 60 minutes.  Playing touch football for 45 minutes.  Walking 1  miles in 35 minutes.  Pushing a stroller 1  miles in 30 minutes.  Playing basketball for 30 minutes.  Raking leaves for 30 minutes.  Bicycling 5 miles in 30 minutes.  Walking 2 miles in 30 minutes.  Dancing for 30 minutes.  Shoveling snow for 15 minutes.  Swimming laps for 20 minutes.  Walking up stairs for 15 minutes.  Bicycling 4 miles in 15 minutes.  Gardening for 30 to 45 minutes.  Jumping rope for 15 minutes.  Washing windows or floors for 45 to 60 minutes. Document Released: 05/24/2010 Document Revised: 07/14/2011 Document Reviewed: 05/24/2010 [employer] Patient Information 2015 [employer]. This information is not intended to replace advice given to you by your health care provider. Make sure you discuss any questions you have with your health care provider.  

## 2014-02-07 NOTE — Progress Notes (Signed)
   Subjective:    Patient ID: Kristina Gardner, female    DOB: March 12, 1971, 43 y.o.   MRN: [id number]  HPI Here due to need for [facility] physician's alternate health plan BMI over 30  Gained 7# since last visit Doesn't really exercise--did try outside walking but limited by weather, etc Some limitations-- boyfriend's kids with them and a grandchild  Doesn't follow any dietary plan Does have health coach with insurance-- has advised snacking (but this didn't work). Has started eating breakfast. Goes out to lunch.  Not much soda--discussed changing to no calorie Rare fruit juice  Current Outpatient Prescriptions on File Prior to Visit  Medication Sig Dispense Refill  . amLODipine (NORVASC) 5 MG tablet Take 1 tablet (5 mg total) by mouth daily.  90 tablet  3  . aspirin 81 MG tablet Take 81 mg by mouth daily.        Marland Kitchen levothyroxine (SYNTHROID, LEVOTHROID) 125 MCG tablet Take 1 tablet (125 mcg total) by mouth daily before breakfast.  90 tablet  3  . metroNIDAZOLE (FLAGYL) 500 MG tablet TAKE 1 TABLET BY MOUTH TWICE A DAY  14 tablet  0  . metroNIDAZOLE (FLAGYL) 500 MG tablet Take 1 tablet (500 mg total) by mouth 2 (two) times daily.  14 tablet  2  . omeprazole (PRILOSEC) 20 MG capsule Take 20 mg by mouth daily.        Marland Kitchen triamterene-hydrochlorothiazide (MAXZIDE-25) 37.5-25 MG per tablet Take 1 tablet by mouth  daily  90 tablet  0  . fluconazole (DIFLUCAN) 150 MG tablet Take one tablet by  Mouth and repeat in 2-3 days if symptoms persist.  2 tablet  0   No current facility-administered medications on file prior to visit.    No Known Allergies  Past Medical History  Diagnosis Date  . Hypertension   . Pneumonia   . GERD (gastroesophageal reflux disease)   . Leiomyoma of uterus, unspecified   . Unspecified hypothyroidism   . Obesity     Past Surgical History  Procedure Laterality Date  . Dilation and curettage of uterus  2000    Family History  Problem Relation Age of Onset    . Hypertension Mother   . Cancer Mother     Bone Marrow   . Kidney disease Mother   . Arthritis Sister   . Cancer Paternal Grandfather     lung cancer    History   Social History  . Marital Status: Single    Spouse Name: N/A    Number of Children: 1  . Years of Education: N/A   Occupational History  . [employer], customer service    Social History Main Topics  . Smoking status: Never Smoker   . Smokeless tobacco: Never Used  . Alcohol Use: Yes     Comment: occasional  . Drug Use: No  . Sexual Activity: Yes    Partners: Male    Birth Control/ Protection: Condom, IUD   Other Topics Concern  . Not on file   Social History Narrative   Live in boyfriend since ~2008. He has 2 children-- don't stay with them anymore   Review of Systems     Objective:   Physical Exam  Constitutional: She appears well-developed and well-nourished. No distress.          Assessment & Plan:

## 2014-02-07 NOTE — Progress Notes (Signed)
Pre visit review using our clinic review tool, if applicable. No additional management support is needed unless otherwise documented below in the visit note. 

## 2014-02-07 NOTE — Assessment & Plan Note (Signed)
Counseled about better eating and more exercise Has to stop eating out Give up sugar sodas Will give goal of 10# weight loss in 6 months Information given Counseled all of 15 minute visit

## 2014-02-22 ENCOUNTER — Telehealth: Payer: Self-pay | Admitting: *Deleted

## 2014-02-22 DIAGNOSIS — B9689 Other specified bacterial agents as the cause of diseases classified elsewhere: Secondary | ICD-10-CM

## 2014-02-22 DIAGNOSIS — N760 Acute vaginitis: Principal | ICD-10-CM

## 2014-02-22 MED ORDER — METRONIDAZOLE 500 MG PO TABS: 500.0000 mg | ORAL_TABLET | Freq: Two times a day (BID) | ORAL | Status: DC

## 2014-02-22 NOTE — Telephone Encounter (Signed)
Patient is requesting a refill of metronidazole for  BV.

## 2014-03-07 ENCOUNTER — Other Ambulatory Visit: Payer: Self-pay | Admitting: Obstetrics & Gynecology

## 2014-03-09 ENCOUNTER — Other Ambulatory Visit: Payer: Self-pay | Admitting: *Deleted

## 2014-03-09 DIAGNOSIS — B3731 Acute candidiasis of vulva and vagina: Secondary | ICD-10-CM

## 2014-03-09 DIAGNOSIS — B373 Candidiasis of vulva and vagina: Secondary | ICD-10-CM

## 2014-03-09 MED ORDER — FLUCONAZOLE 150 MG PO TABS: ORAL_TABLET | ORAL | Status: DC

## 2014-03-09 NOTE — Telephone Encounter (Signed)
Patient is requesting refill of Diflucan for yeast infection.

## 2014-04-12 ENCOUNTER — Other Ambulatory Visit: Payer: Self-pay | Admitting: Internal Medicine

## 2014-05-23 ENCOUNTER — Other Ambulatory Visit: Payer: Self-pay | Admitting: Family Medicine

## 2014-05-23 DIAGNOSIS — N760 Acute vaginitis: Principal | ICD-10-CM

## 2014-05-23 DIAGNOSIS — B9689 Other specified bacterial agents as the cause of diseases classified elsewhere: Secondary | ICD-10-CM

## 2014-05-24 MED ORDER — METRONIDAZOLE 500 MG PO TABS: ORAL_TABLET | ORAL | Status: DC

## 2014-05-24 NOTE — Telephone Encounter (Signed)
Ok to refill metronidazole.

## 2014-07-17 ENCOUNTER — Other Ambulatory Visit: Payer: Self-pay | Admitting: Family Medicine

## 2014-07-19 ENCOUNTER — Telehealth: Payer: Self-pay

## 2014-07-19 NOTE — Telephone Encounter (Signed)
Patient called needing Dyflucan 150mg  #2 1 refill has symptoms of yeast. Gets them often. Called it in to her pharmacy. Also made her a annual visit for 08/04/14.

## 2014-08-04 ENCOUNTER — Encounter: Payer: Self-pay | Admitting: Obstetrics & Gynecology

## 2014-08-04 ENCOUNTER — Ambulatory Visit (INDEPENDENT_AMBULATORY_CARE_PROVIDER_SITE_OTHER): Payer: 59 | Admitting: Obstetrics & Gynecology

## 2014-08-04 VITALS — BP 140/96 | HR 106 | Ht 67.0 in | Wt 211.0 lb

## 2014-08-04 DIAGNOSIS — Z124 Encounter for screening for malignant neoplasm of cervix: Secondary | ICD-10-CM | POA: Diagnosis not present

## 2014-08-04 DIAGNOSIS — N898 Other specified noninflammatory disorders of vagina: Secondary | ICD-10-CM

## 2014-08-04 DIAGNOSIS — Z01419 Encounter for gynecological examination (general) (routine) without abnormal findings: Secondary | ICD-10-CM

## 2014-08-04 DIAGNOSIS — Z0000 Encounter for general adult medical examination without abnormal findings: Secondary | ICD-10-CM

## 2014-08-04 DIAGNOSIS — Z30431 Encounter for routine checking of intrauterine contraceptive device: Secondary | ICD-10-CM

## 2014-08-04 NOTE — Progress Notes (Signed)
Subjective:    Kristina Gardner is a 44 y.o. SAA P1 (74 yo daughter) female who presents for an annual exam. The patient has no complaints today. She has some vaginal itching. Has not used any OTC meds, no change in deodorant, soap, detergent, etc. The patient is sexually active. GYN screening history: last pap: was normal. The patient wears seatbelts: yes. The patient participates in regular exercise: no. Has the patient ever been transfused or tattooed?: no. The patient reports that there is not domestic violence in her life.   Menstrual History: OB History    No data available      Menarche age: 60  No LMP recorded. Patient is not currently having periods (Reason: IUD).    The following portions of the patient's history were reviewed and updated as appropriate: allergies, current medications, past family history, past medical history, past social history, past surgical history and problem list.  Review of Systems Pertinent items are noted in HPI. Monogamous for 8 years, lives with BF, denies dyspareunia. works at [employer], only occasional spotting, some cramps. She had the [place] for fibroids about 4 years ago.   Objective:    BP 140/96 mmHg  Pulse 106  Ht 5\' 7"  (1.702 m)  Wt 211 lb (95.709 kg)  BMI 33.04 kg/m2  General Appearance:    Alert, cooperative, no distress, appears stated age  Head:    Normocephalic, without obvious abnormality, atraumatic  Eyes:    PERRL, conjunctiva/corneas clear, EOM's intact, fundi    benign, both eyes  Ears:    Normal TM's and external ear canals, both ears  Nose:   Nares normal, septum midline, mucosa normal, no drainage    or sinus tenderness  Throat:   Lips, mucosa, and tongue normal; teeth and gums normal  Neck:   Supple, symmetrical, trachea midline, no adenopathy;    thyroid:  no enlargement/tenderness/nodules; no carotid   bruit or JVD  Back:     Symmetric, no curvature, ROM normal, no CVA tenderness  Lungs:     Clear to  auscultation bilaterally, respirations unlabored  Chest Wall:    No tenderness or deformity   Heart:    Regular rate and rhythm, S1 and S2 normal, no murmur, rub   or gallop  Breast Exam:    No tenderness, masses, or nipple abnormality  Abdomen:     Soft, non-tender, bowel sounds active all four quadrants,    no masses, no organomegaly  Genitalia:    Normal female without lesion, discharge or tenderness, 16 week size NT, mobile uterus, adnexa not palpable, no abnormal discharge seen (did see a little old blood)     Extremities:   Extremities normal, atraumatic, no cyanosis or edema  Pulses:   2+ and symmetric all extremities  Skin:   Skin color, texture, turgor normal, no rashes or lesions  Lymph nodes:   Cervical, supraclavicular, and axillary nodes normal  Neurologic:   CNII-XII intact, normal strength, sensation and reflexes    throughout  .    Assessment:    Healthy female exam.   Vaginal itch   Plan:     Breast self exam technique reviewed and patient encouraged to perform self-exam monthly. Mammogram. Thin prep Pap smear. with cotesting I sent a wet prep but see nothing abnormal

## 2014-08-07 LAB — NUSWAB VAGINITIS PLUS (VG+): CANDIDA GLABRATA, NAA: NEGATIVE

## 2014-08-09 ENCOUNTER — Other Ambulatory Visit: Payer: Self-pay | Admitting: Internal Medicine

## 2014-08-10 ENCOUNTER — Encounter: Payer: Self-pay | Admitting: Obstetrics & Gynecology

## 2014-08-12 LAB — PAP LB, RFX HPV ALL PTH: HPV, HIGH-RISK: NEGATIVE

## 2014-08-25 ENCOUNTER — Encounter: Payer: Self-pay | Admitting: Obstetrics & Gynecology

## 2014-10-06 ENCOUNTER — Telehealth: Payer: Self-pay | Admitting: *Deleted

## 2014-10-06 DIAGNOSIS — B379 Candidiasis, unspecified: Secondary | ICD-10-CM

## 2014-10-06 MED ORDER — FLUCONAZOLE 150 MG PO TABS: 150.0000 mg | ORAL_TABLET | Freq: Once | ORAL | Status: DC

## 2014-10-06 NOTE — Telephone Encounter (Signed)
Patient called and needed something sent in for a yeast infection. I have sent in Diflucan to [facility].

## 2014-11-17 ENCOUNTER — Encounter: Payer: 59 | Admitting: Internal Medicine

## 2014-12-11 ENCOUNTER — Ambulatory Visit (INDEPENDENT_AMBULATORY_CARE_PROVIDER_SITE_OTHER): Payer: 59 | Admitting: Internal Medicine

## 2014-12-11 ENCOUNTER — Encounter: Payer: Self-pay | Admitting: Internal Medicine

## 2014-12-11 VITALS — BP 160/110 | HR 99 | Temp 98.0°F | Ht 67.0 in | Wt 207.0 lb

## 2014-12-11 DIAGNOSIS — E038 Other specified hypothyroidism: Secondary | ICD-10-CM

## 2014-12-11 DIAGNOSIS — I10 Essential (primary) hypertension: Secondary | ICD-10-CM | POA: Diagnosis not present

## 2014-12-11 DIAGNOSIS — Z0000 Encounter for general adult medical examination without abnormal findings: Secondary | ICD-10-CM | POA: Diagnosis not present

## 2014-12-11 MED ORDER — LOSARTAN POTASSIUM-HCTZ 100-25 MG PO TABS: 1.0000 | ORAL_TABLET | Freq: Every day | ORAL | Status: DC

## 2014-12-11 NOTE — Progress Notes (Signed)
Pre visit review using our clinic review tool, if applicable. No additional management support is needed unless otherwise documented below in the visit note. 

## 2014-12-11 NOTE — Assessment & Plan Note (Signed)
Seems to be euthyroid 

## 2014-12-11 NOTE — Assessment & Plan Note (Signed)
BP Readings from Last 3 Encounters:  12/11/14 160/110  08/04/14 140/96  02/07/14 124/82   Repeat 148/120 on right Will add losartan---since didn't tolerate the amlodipine

## 2014-12-11 NOTE — Progress Notes (Signed)
Subjective:    Patient ID: Kristina Gardner, female    DOB: 12-14-1970, 44 y.o.   MRN: [id number]  HPI Here for physical  Recent Gyn visit Pap done  Had gotten up recently and was dizzy--- so she stopped her meds and missed a day of work [employer] back on both of them--and got dizzy again So now just on the diuretic (amlodipine was last to be added) Feels better just on the 1 med No dizziness  Rare slight headache--has one now  Now on the 125mg  levothroxine She thinks this is okay  Gets pain in breasts at times Throbbing pain --that will then finally resolve Didn't discuss with Dr Hulan Fray  Notices pain and puffiness in left heel when first getting up after sitting Eases up over time Discussed proper arch support  Current Outpatient Prescriptions on File Prior to Visit  Medication Sig Dispense Refill  . triamterene-hydrochlorothiazide (MAXZIDE-25) 37.5-25 MG per tablet Take 1 tablet by mouth  daily 90 tablet 3   No current facility-administered medications on file prior to visit.    Allergies  Allergen Reactions  . Norvasc [Amlodipine Besylate] Other (See Comments)    dizziness    Past Medical History  Diagnosis Date  . Hypertension   . Pneumonia   . GERD (gastroesophageal reflux disease)   . Leiomyoma of uterus, unspecified   . Unspecified hypothyroidism   . Obesity     Past Surgical History  Procedure Laterality Date  . Dilation and curettage of uterus  2000    Family History  Problem Relation Age of Onset  . Hypertension Mother   . Cancer Mother     Bone Marrow   . Kidney disease Mother   . Arthritis Sister   . Cancer Paternal Grandfather     lung cancer  . Cancer Maternal Aunt     from snuff  . Dementia Maternal Aunt     History   Social History  . Marital Status: Single    Spouse Name: N/A  . Number of Children: 1  . Years of Education: N/A   Occupational History  . [employer], customer service    Social History Main Topics  . Smoking  status: Never Smoker   . Smokeless tobacco: Never Used  . Alcohol Use: Yes     Comment: occasional  . Drug Use: No  . Sexual Activity:    Partners: Male    Birth Control/ Protection: Condom, IUD   Other Topics Concern  . Not on file   Social History Narrative   Live in boyfriend since ~2008. He has 2 children-- don't stay with them anymore   Review of Systems  Constitutional: Negative for fatigue and unexpected weight change.       Wears seat belt  HENT: Negative for dental problem, hearing loss, tinnitus and trouble swallowing.        Keeps up with dentist  Eyes: Negative for visual disturbance.       No diplopia or unilateral vision loss  Respiratory: Positive for cough. Negative for chest tightness and shortness of breath.        Occ cough at night from acid reflux  Cardiovascular: Negative for chest pain, palpitations and leg swelling.  Gastrointestinal: Positive for constipation. Negative for nausea, vomiting, abdominal pain and blood in stool.  Endocrine: Negative for polydipsia and polyuria.  Genitourinary: Negative for dysuria, hematuria, difficulty urinating and dyspareunia.  Musculoskeletal: Negative for back pain, joint swelling and arthralgias.  Skin: Negative for rash.  Allergic/Immunologic: Positive for environmental allergies. Negative for immunocompromised state.       Has chronic nasal drainage (PND)--all year round.  Neurological: Positive for dizziness. Negative for syncope and light-headedness.       Mild weakness in right arm--relates to leaning on arm at work  Hematological: Negative for adenopathy. Does not bruise/bleed easily.  Psychiatric/Behavioral: Negative for sleep disturbance and dysphoric mood. The patient is not nervous/anxious.        Gets occasional bad night sleeping Some stress with boyfriend's 2 daughters now living with them again       Objective:   Physical Exam  Constitutional: She is oriented to person, place, and time. She appears  well-developed and well-nourished. No distress.  HENT:  Head: Normocephalic and atraumatic.  Right Ear: External ear normal.  Left Ear: External ear normal.  Mouth/Throat: Oropharynx is clear and moist. No oropharyngeal exudate.  Eyes: Conjunctivae and EOM are normal. Pupils are equal, round, and reactive to light.  Neck: Normal range of motion. Neck supple. No thyromegaly present.  Cardiovascular: Normal rate, regular rhythm, normal heart sounds and intact distal pulses.  Exam reveals no gallop.   No murmur heard. Pulmonary/Chest: Effort normal and breath sounds normal. No respiratory distress. She has no wheezes. She has no rales.  Abdominal: Soft. There is no tenderness.  Musculoskeletal: She exhibits no edema or tenderness.  Lymphadenopathy:    She has no cervical adenopathy.  Neurological: She is alert and oriented to person, place, and time.  Skin: No erythema.  Psychiatric: She has a normal mood and affect. Her behavior is normal.          Assessment & Plan:

## 2014-12-11 NOTE — Assessment & Plan Note (Signed)
Recent gyn visit She did just have a mammogram this year UTD on TD

## 2014-12-14 LAB — COMPREHENSIVE METABOLIC PANEL
ALK PHOS: 124 IU/L — AB (ref 39–117)
ALT: 19 IU/L (ref 0–32)
AST: 17 IU/L (ref 0–40)
Albumin/Globulin Ratio: 1.1 (ref 1.1–2.5)
Albumin: 4.4 g/dL (ref 3.5–5.5)
BUN / CREAT RATIO: 15 (ref 9–23)
BUN: 12 mg/dL (ref 6–24)
CO2: 20 mmol/L (ref 18–29)
Calcium: 10.1 mg/dL (ref 8.7–10.2)
Chloride: 99 mmol/L (ref 97–108)
Creatinine, Ser: 0.82 mg/dL (ref 0.57–1.00)
GFR calc Af Amer: 99 mL/min/{1.73_m2} (ref >59)
GFR, EST NON AFRICAN AMERICAN: 86 mL/min/{1.73_m2} (ref >59)
Globulin, Total: 3.9 g/dL (ref 1.5–4.5)
Glucose: 84 mg/dL (ref 65–99)
Potassium: 3.7 mmol/L (ref 3.5–5.2)
SODIUM: 142 mmol/L (ref 134–144)
Total Protein: 8.3 g/dL (ref 6.0–8.5)

## 2014-12-14 LAB — CBC WITH DIFFERENTIAL/PLATELET
BASOS: 1 %
Basophils Absolute: 0 10*3/uL (ref 0.0–0.2)
EOS (ABSOLUTE): 0.1 10*3/uL (ref 0.0–0.4)
EOS: 3 %
HEMOGLOBIN: 15.4 g/dL (ref 11.1–15.9)
Hematocrit: 46.3 % (ref 34.0–46.6)
Immature Grans (Abs): 0 10*3/uL (ref 0.0–0.1)
Immature Granulocytes: 0 %
LYMPHS ABS: 1.7 10*3/uL (ref 0.7–3.1)
Lymphs: 34 %
MCH: 28.7 pg (ref 26.6–33.0)
MCHC: 33.3 g/dL (ref 31.5–35.7)
MCV: 86 fL (ref 79–97)
MONOS ABS: 0.4 10*3/uL (ref 0.1–0.9)
Monocytes: 8 %
NEUTROS ABS: 2.8 10*3/uL (ref 1.4–7.0)
Neutrophils: 54 %
PLATELETS: 381 10*3/uL — AB (ref 150–379)
RBC: 5.37 x10E6/uL — AB (ref 3.77–5.28)
RDW: 13.8 % (ref 12.3–15.4)
WBC: 5.1 10*3/uL (ref 3.4–10.8)

## 2014-12-14 LAB — LIPID PANEL
CHOLESTEROL TOTAL: 233 mg/dL — AB (ref 100–199)
Chol/HDL Ratio: 4.2 ratio units (ref 0.0–4.4)
HDL: 55 mg/dL (ref >39)
LDL Calculated: 155 mg/dL — ABNORMAL HIGH (ref 0–99)
Triglycerides: 113 mg/dL (ref 0–149)
VLDL Cholesterol Cal: 23 mg/dL (ref 5–40)

## 2014-12-14 LAB — T4, FREE: FREE T4: 1.1 ng/dL (ref 0.82–1.77)

## 2014-12-14 LAB — TSH: TSH: 5.97 u[IU]/mL — AB (ref 0.450–4.500)

## 2014-12-16 ENCOUNTER — Other Ambulatory Visit: Payer: Self-pay | Admitting: Internal Medicine

## 2015-01-22 ENCOUNTER — Encounter: Payer: Self-pay | Admitting: Internal Medicine

## 2015-01-22 ENCOUNTER — Ambulatory Visit (INDEPENDENT_AMBULATORY_CARE_PROVIDER_SITE_OTHER): Payer: 59 | Admitting: Internal Medicine

## 2015-01-22 VITALS — BP 158/100 | HR 92 | Temp 97.7°F | Wt 210.0 lb

## 2015-01-22 DIAGNOSIS — E669 Obesity, unspecified: Secondary | ICD-10-CM | POA: Diagnosis not present

## 2015-01-22 DIAGNOSIS — I10 Essential (primary) hypertension: Secondary | ICD-10-CM

## 2015-01-22 MED ORDER — DILTIAZEM HCL ER 180 MG PO CP24: 180.0000 mg | ORAL_CAPSULE | Freq: Every day | ORAL | Status: DC

## 2015-01-22 NOTE — Progress Notes (Signed)
   Subjective:    Patient ID: Kristina Gardner, female    DOB: 1970/05/29, 44 y.o.   MRN: [id number]  HPI Here for follow up of HTN  Did have some dizziness at first with the med--taking early in the AM Does better at night No chest pain No cough No syncope Intermittent edema--not persistent  Current Outpatient Prescriptions on File Prior to Visit  Medication Sig Dispense Refill  . levothyroxine (SYNTHROID, LEVOTHROID) 125 MCG tablet Take 1 tablet (125 mcg  total) by mouth daily  before breakfast 90 tablet 3  . losartan-hydrochlorothiazide (HYZAAR) 100-25 MG per tablet Take 1 tablet by mouth daily. 30 tablet 11   No current facility-administered medications on file prior to visit.    Allergies  Allergen Reactions  . Norvasc [Amlodipine Besylate] Other (See Comments)    dizziness    Past Medical History  Diagnosis Date  . Hypertension   . Pneumonia   . GERD (gastroesophageal reflux disease)   . Leiomyoma of uterus, unspecified   . Unspecified hypothyroidism   . Obesity     Past Surgical History  Procedure Laterality Date  . Dilation and curettage of uterus  2000    Family History  Problem Relation Age of Onset  . Hypertension Mother   . Cancer Mother     Bone Marrow   . Kidney disease Mother   . Arthritis Sister   . Cancer Paternal Grandfather     lung cancer  . Cancer Maternal Aunt     from snuff  . Dementia Maternal Aunt     Social History   Social History  . Marital Status: Single    Spouse Name: N/A  . Number of Children: 1  . Years of Education: N/A   Occupational History  . [employer], customer service    Social History Main Topics  . Smoking status: Never Smoker   . Smokeless tobacco: Never Used  . Alcohol Use: Yes     Comment: occasional  . Drug Use: No  . Sexual Activity:    Partners: Male    Birth Control/ Protection: Condom, IUD   Other Topics Concern  . Not on file   Social History Narrative   Live in boyfriend since ~2008.  He has 2 children-- don't stay with them anymore   Review of Systems Nocturia x 1 Appetite is okay    Objective:   Physical Exam  Constitutional: She appears well-developed and well-nourished. No distress.  Neck: Normal range of motion. Neck supple. No thyromegaly present.  Cardiovascular: Normal rate, regular rhythm and normal heart sounds.  Exam reveals no gallop.   No murmur heard. Pulmonary/Chest: Effort normal and breath sounds normal. No respiratory distress. She has no wheezes. She has no rales.  Musculoskeletal: She exhibits no edema.  Lymphadenopathy:    She has no cervical adenopathy.          Assessment & Plan:

## 2015-01-22 NOTE — Assessment & Plan Note (Signed)
BP Readings from Last 3 Encounters:  01/22/15 158/100  12/11/14 160/110  08/04/14 140/96   Not much response to ARB Didn't tolerate amlodipine but CCB probably best bet Will add diltiazem

## 2015-01-22 NOTE — Progress Notes (Signed)
Pre visit review using our clinic review tool, if applicable. No additional management support is needed unless otherwise documented below in the visit note. 

## 2015-01-22 NOTE — Addendum Note (Signed)
Addended by: Marchia Bond on: 01/22/2015 04:22 PM   Modules accepted: Orders

## 2015-01-22 NOTE — Assessment & Plan Note (Signed)
Discussed [facility] BMI criteria Form done--suggested 10# weight loss in next year

## 2015-01-22 NOTE — Patient Instructions (Signed)
Please check your blood pressure after 2-3 weeks. If it is still over 150/100--let me know and we can increase the dose before your next visit.

## 2015-01-24 LAB — RENAL FUNCTION PANEL
Albumin: 4.4 g/dL (ref 3.5–5.5)
BUN/Creatinine Ratio: 10 (ref 9–23)
BUN: 9 mg/dL (ref 6–24)
CALCIUM: 9.7 mg/dL (ref 8.7–10.2)
CHLORIDE: 96 mmol/L — AB (ref 97–108)
CO2: 20 mmol/L (ref 18–29)
CREATININE: 0.93 mg/dL (ref 0.57–1.00)
GFR calc Af Amer: 85 mL/min/{1.73_m2} (ref >59)
GFR calc non Af Amer: 74 mL/min/{1.73_m2} (ref >59)
Glucose: 81 mg/dL (ref 65–99)
PHOSPHORUS: 3.3 mg/dL (ref 2.5–4.5)
Potassium: 3.7 mmol/L (ref 3.5–5.2)
SODIUM: 139 mmol/L (ref 134–144)

## 2015-02-19 ENCOUNTER — Telehealth: Payer: Self-pay | Admitting: Internal Medicine

## 2015-02-19 MED ORDER — LOSARTAN POTASSIUM-HCTZ 100-25 MG PO TABS: 1.0000 | ORAL_TABLET | Freq: Every day | ORAL | Status: DC

## 2015-02-19 NOTE — Telephone Encounter (Signed)
rx sent to pharmacy by e-script  

## 2015-02-19 NOTE — Telephone Encounter (Signed)
Pt needs her old bp medicine called into [facility]. She is on two medications but you need the 1st med she was on called in.  CB # [phone]

## 2015-02-22 ENCOUNTER — Other Ambulatory Visit: Payer: Self-pay | Admitting: Obstetrics and Gynecology

## 2015-03-26 ENCOUNTER — Encounter: Payer: Self-pay | Admitting: Internal Medicine

## 2015-03-26 ENCOUNTER — Ambulatory Visit (INDEPENDENT_AMBULATORY_CARE_PROVIDER_SITE_OTHER): Payer: 59 | Admitting: Internal Medicine

## 2015-03-26 VITALS — BP 134/94 | HR 90 | Temp 97.5°F | Wt 212.0 lb

## 2015-03-26 DIAGNOSIS — I10 Essential (primary) hypertension: Secondary | ICD-10-CM | POA: Diagnosis not present

## 2015-03-26 NOTE — Patient Instructions (Signed)
DASH Eating Plan DASH stands for "Dietary Approaches to Stop Hypertension." The DASH eating plan is a healthy eating plan that has been shown to reduce high blood pressure (hypertension). Additional health benefits may include reducing the risk of type 2 diabetes mellitus, heart disease, and stroke. The DASH eating plan may also help with weight loss. WHAT DO I NEED TO KNOW ABOUT THE DASH EATING PLAN? For the DASH eating plan, you will follow these general guidelines:  Choose foods with a percent daily value for sodium of less than 5% (as listed on the food label).  Use salt-free seasonings or herbs instead of table salt or sea salt.  Check with your health care provider or pharmacist before using salt substitutes.  Eat lower-sodium products, often labeled as "lower sodium" or "no salt added."  Eat fresh foods.  Eat more vegetables, fruits, and low-fat dairy products.  Choose whole grains. Look for the word "whole" as the first word in the ingredient list.  Choose fish and skinless chicken or turkey more often than red meat. Limit fish, poultry, and meat to 6 oz (170 g) each day.  Limit sweets, desserts, sugars, and sugary drinks.  Choose heart-healthy fats.  Limit cheese to 1 oz (28 g) per day.  Eat more home-cooked food and less restaurant, buffet, and fast food.  Limit fried foods.  Cook foods using methods other than frying.  Limit canned vegetables. If you do use them, rinse them well to decrease the sodium.  When eating at a restaurant, ask that your food be prepared with less salt, or no salt if possible. WHAT FOODS CAN I EAT? Seek help from a dietitian for individual calorie needs. Grains Whole grain or whole wheat bread. Brown rice. Whole grain or whole wheat pasta. Quinoa, bulgur, and whole grain cereals. Low-sodium cereals. Corn or whole wheat flour tortillas. Whole grain cornbread. Whole grain crackers. Low-sodium crackers. Vegetables Fresh or frozen vegetables  (raw, steamed, roasted, or grilled). Low-sodium or reduced-sodium tomato and vegetable juices. Low-sodium or reduced-sodium tomato sauce and paste. Low-sodium or reduced-sodium canned vegetables.  Fruits All fresh, canned (in natural juice), or frozen fruits. Meat and Other Protein Products Ground beef (85% or leaner), grass-fed beef, or beef trimmed of fat. Skinless chicken or turkey. Ground chicken or turkey. Pork trimmed of fat. All fish and seafood. Eggs. Dried beans, peas, or lentils. Unsalted nuts and seeds. Unsalted canned beans. Dairy Low-fat dairy products, such as skim or 1% milk, 2% or reduced-fat cheeses, low-fat ricotta or cottage cheese, or plain low-fat yogurt. Low-sodium or reduced-sodium cheeses. Fats and Oils Tub margarines without trans fats. Light or reduced-fat mayonnaise and salad dressings (reduced sodium). Avocado. Safflower, olive, or canola oils. Natural peanut or almond butter. Other Unsalted popcorn and pretzels. The items listed above may not be a complete list of recommended foods or beverages. Contact your dietitian for more options. WHAT FOODS ARE NOT RECOMMENDED? Grains White bread. White pasta. White rice. Refined cornbread. Bagels and croissants. Crackers that contain trans fat. Vegetables Creamed or fried vegetables. Vegetables in a cheese sauce. Regular canned vegetables. Regular canned tomato sauce and paste. Regular tomato and vegetable juices. Fruits Dried fruits. Canned fruit in light or heavy syrup. Fruit juice. Meat and Other Protein Products Fatty cuts of meat. Ribs, chicken wings, bacon, sausage, bologna, salami, chitterlings, fatback, hot dogs, bratwurst, and packaged luncheon meats. Salted nuts and seeds. Canned beans with salt. Dairy Whole or 2% milk, cream, half-and-half, and cream cheese. Whole-fat or sweetened yogurt. Full-fat   cheeses or blue cheese. Nondairy creamers and whipped toppings. Processed cheese, cheese spreads, or cheese  curds. Condiments Onion and garlic salt, seasoned salt, table salt, and sea salt. Canned and packaged gravies. Worcestershire sauce. Tartar sauce. Barbecue sauce. Teriyaki sauce. Soy sauce, including reduced sodium. Steak sauce. Fish sauce. Oyster sauce. Cocktail sauce. Horseradish. Ketchup and mustard. Meat flavorings and tenderizers. Bouillon cubes. Hot sauce. Tabasco sauce. Marinades. Taco seasonings. Relishes. Fats and Oils Butter, stick margarine, lard, shortening, ghee, and bacon fat. Coconut, palm kernel, or palm oils. Regular salad dressings. Other Pickles and olives. Salted popcorn and pretzels. The items listed above may not be a complete list of foods and beverages to avoid. Contact your dietitian for more information. WHERE CAN I FIND MORE INFORMATION? National Heart, Lung, and Blood Institute: www.nhlbi.nih.gov/health/health-topics/topics/dash/   This information is not intended to replace advice given to you by your health care provider. Make sure you discuss any questions you have with your health care provider.   Document Released: 04/10/2011 Document Revised: 05/12/2014 Document Reviewed: 02/23/2013 Elsevier Interactive Patient Education 2016 Elsevier Inc.   Exercising to Lose Weight Exercising can help you to lose weight. In order to lose weight through exercise, you need to do vigorous-intensity exercise. You can tell that you are exercising with vigorous intensity if you are breathing very hard and fast and cannot hold a conversation while exercising. Moderate-intensity exercise helps to maintain your current weight. You can tell that you are exercising at a moderate level if you have a higher heart rate and faster breathing, but you are still able to hold a conversation. HOW OFTEN SHOULD I EXERCISE? Choose an activity that you enjoy and set realistic goals. Your health care provider can help you to make an activity plan that works for you. Exercise regularly as directed by  your health care provider. This may include:  Doing resistance training twice each week, such as:  Push-ups.  Sit-ups.  Lifting weights.  Using resistance bands.  Doing a given intensity of exercise for a given amount of time. Choose from these options:  150 minutes of moderate-intensity exercise every week.  75 minutes of vigorous-intensity exercise every week.  A mix of moderate-intensity and vigorous-intensity exercise every week. Children, pregnant women, people who are out of shape, people who are overweight, and older adults may need to consult a health care provider for individual recommendations. If you have any sort of medical condition, be sure to consult your health care provider before starting a new exercise program. WHAT ARE SOME ACTIVITIES THAT CAN HELP ME TO LOSE WEIGHT?   Walking at a rate of at least 4.5 miles an hour.  Jogging or running at a rate of 5 miles per hour.  Biking at a rate of at least 10 miles per hour.  Lap swimming.  Roller-skating or in-line skating.  Cross-country skiing.  Vigorous competitive sports, such as football, basketball, and soccer.  Jumping rope.  Aerobic dancing. HOW CAN I BE MORE ACTIVE IN MY DAY-TO-DAY ACTIVITIES?  Use the stairs instead of the elevator.  Take a walk during your lunch break.  If you drive, park your car farther away from work or school.  If you take public transportation, get off one stop early and walk the rest of the way.  Make all of your phone calls while standing up and walking around.  Get up, stretch, and walk around every 30 minutes throughout the day. WHAT GUIDELINES SHOULD I FOLLOW WHILE EXERCISING?  Do not exercise so   much that you hurt yourself, feel dizzy, or get very short of breath.  Consult your health care provider prior to starting a new exercise program.  Wear comfortable clothes and shoes with good support.  Drink plenty of water while you exercise to prevent dehydration  or heat stroke. Body water is lost during exercise and must be replaced.  Work out until you breathe faster and your heart beats faster.   This information is not intended to replace advice given to you by your health care provider. Make sure you discuss any questions you have with your health care provider.   Document Released: 05/24/2010 Document Revised: 05/12/2014 Document Reviewed: 09/22/2013 Elsevier Interactive Patient Education 2016 Elsevier Inc.  

## 2015-03-26 NOTE — Assessment & Plan Note (Signed)
BP Readings from Last 3 Encounters:  03/26/15 134/94  01/22/15 158/100  12/11/14 160/110   Much better Tolerating the med Could push the CCB given her heart rate--but will hold off for now

## 2015-03-26 NOTE — Progress Notes (Signed)
   Subjective:    Patient ID: Kristina Gardner, female    DOB: 1970/06/13, 44 y.o.   MRN: [id number]  HPI Here for follow up of HTN Did check [facility] ago-- 116/85  No headaches No chest pain No SOB No dizziness  Current Outpatient Prescriptions on File Prior to Visit  Medication Sig Dispense Refill  . diltiazem (DILACOR XR) 180 MG 24 hr capsule Take 1 capsule (180 mg total) by mouth daily. 90 capsule 3  . levothyroxine (SYNTHROID, LEVOTHROID) 125 MCG tablet Take 1 tablet (125 mcg  total) by mouth daily  before breakfast 90 tablet 3  . losartan-hydrochlorothiazide (HYZAAR) 100-25 MG tablet Take 1 tablet by mouth daily. 90 tablet 3   No current facility-administered medications on file prior to visit.    Allergies  Allergen Reactions  . Norvasc [Amlodipine Besylate] Other (See Comments)    dizziness    Past Medical History  Diagnosis Date  . Hypertension   . Pneumonia   . GERD (gastroesophageal reflux disease)   . Leiomyoma of uterus, unspecified   . Unspecified hypothyroidism   . Obesity     Past Surgical History  Procedure Laterality Date  . Dilation and curettage of uterus  2000    Family History  Problem Relation Age of Onset  . Hypertension Mother   . Cancer Mother     Bone Marrow   . Kidney disease Mother   . Arthritis Sister   . Cancer Paternal Grandfather     lung cancer  . Cancer Maternal Aunt     from snuff  . Dementia Maternal Aunt     Social History   Social History  . Marital Status: Single    Spouse Name: N/A  . Number of Children: 1  . Years of Education: N/A   Occupational History  . [employer], customer service    Social History Main Topics  . Smoking status: Never Smoker   . Smokeless tobacco: Never Used  . Alcohol Use: Yes     Comment: occasional  . Drug Use: No  . Sexual Activity:    Partners: Male    Birth Control/ Protection: Condom, IUD   Other Topics Concern  . Not on file   Social History Narrative   Live in boyfriend since ~2008. He has 2 children-- don't stay with them anymore   Review of Systems Starting to get some cold symptoms Appetite is down some  Weight is up 2#    Objective:   Physical Exam  Constitutional: She appears well-developed and well-nourished. No distress.  Neck: Normal range of motion. Neck supple. No thyromegaly present.  Cardiovascular: Normal rate, regular rhythm and normal heart sounds.  Exam reveals no gallop.   No murmur heard. HR 90  now  Pulmonary/Chest: Effort normal and breath sounds normal. No respiratory distress. She has no wheezes. She has no rales.  Musculoskeletal: She exhibits no edema.  Lymphadenopathy:    She has no cervical adenopathy.          Assessment & Plan:

## 2015-03-26 NOTE — Progress Notes (Signed)
Pre visit review using our clinic review tool, if applicable. No additional management support is needed unless otherwise documented below in the visit note. 

## 2015-05-02 ENCOUNTER — Encounter: Payer: 59 | Admitting: Internal Medicine

## 2015-05-14 ENCOUNTER — Other Ambulatory Visit: Payer: Self-pay | Admitting: Obstetrics and Gynecology

## 2015-06-14 ENCOUNTER — Telehealth: Payer: Self-pay | Admitting: *Deleted

## 2015-06-14 DIAGNOSIS — N898 Other specified noninflammatory disorders of vagina: Secondary | ICD-10-CM

## 2015-06-14 MED ORDER — METRONIDAZOLE 500 MG PO TABS: 500.0000 mg | ORAL_TABLET | Freq: Two times a day (BID) | ORAL | Status: DC

## 2015-06-14 NOTE — Telephone Encounter (Signed)
Pt called c/o vaginal discharge with foul smelling odor, declines any vaginal itching or irritation.  Sent rx for Flagyl to pharmacy and instructed pt on medication use.  If pt still symptomatic after treatment will need to make an appt.

## 2015-07-26 ENCOUNTER — Other Ambulatory Visit: Payer: Self-pay | Admitting: Obstetrics and Gynecology

## 2015-10-02 ENCOUNTER — Other Ambulatory Visit: Payer: Self-pay | Admitting: Internal Medicine

## 2015-10-03 ENCOUNTER — Other Ambulatory Visit (INDEPENDENT_AMBULATORY_CARE_PROVIDER_SITE_OTHER): Payer: 59 | Admitting: *Deleted

## 2015-10-03 DIAGNOSIS — R350 Frequency of micturition: Secondary | ICD-10-CM | POA: Diagnosis not present

## 2015-10-03 LAB — POCT URINALYSIS DIPSTICK
Bilirubin, UA: NEGATIVE
GLUCOSE UA: NEGATIVE
KETONES UA: NEGATIVE
Nitrite, UA: NEGATIVE
SPEC GRAV UA: 1.015
UROBILINOGEN UA: 0.2
pH, UA: 6.5

## 2015-10-03 MED ORDER — PHENAZOPYRIDINE HCL 200 MG PO TABS: 200.0000 mg | ORAL_TABLET | Freq: Three times a day (TID) | ORAL | Status: DC | PRN

## 2015-10-03 MED ORDER — SULFAMETHOXAZOLE-TRIMETHOPRIM 800-160 MG PO TABS: 1.0000 | ORAL_TABLET | Freq: Two times a day (BID) | ORAL | Status: DC

## 2015-10-03 NOTE — Progress Notes (Signed)
Pt here today c/o urinary frequency, UA + leukocytes and blood, will send urine cx.  Sent Bactrim and Pyridium to the pharmacy and instructed pt on medication use and to increase water intake.

## 2015-10-06 LAB — URINE CULTURE: Colony Count: >=100000

## 2015-10-09 ENCOUNTER — Encounter: Payer: Self-pay | Admitting: *Deleted

## 2015-10-09 ENCOUNTER — Ambulatory Visit (INDEPENDENT_AMBULATORY_CARE_PROVIDER_SITE_OTHER): Payer: 59 | Admitting: Family Medicine

## 2015-10-09 ENCOUNTER — Encounter: Payer: Self-pay | Admitting: Family Medicine

## 2015-10-09 VITALS — BP 149/113 | HR 96 | Resp 16 | Ht 66.0 in | Wt 214.2 lb

## 2015-10-09 DIAGNOSIS — Z30433 Encounter for removal and reinsertion of intrauterine contraceptive device: Secondary | ICD-10-CM | POA: Diagnosis not present

## 2015-10-09 MED ORDER — LEVONORGESTREL 18.6 MCG/DAY IU IUD
INTRAUTERINE_SYSTEM | Freq: Once | INTRAUTERINE | Status: AC
  Administered 2015-10-09: 14:00:00 via INTRAUTERINE

## 2015-10-09 NOTE — Progress Notes (Signed)
    Subjective: Patient here for IUD change.  Has been in for 3 years and due to be changed. She has no cycles and desires to continue IUD for contraception and bleeding profile. Does not want hysterectomy at this time. Objective: Filed Vitals:   10/09/15 1328  BP: 149/113  Pulse: 96  Resp: 16    Patient identified, informed consent performed, signed copy in chart, time out was performed Procedure: Speculum placed inside vagina.  Cervix visualized.  Strings grasped with ring forceps.  IUD removed intact.  Procedure: Cervix visualized.  Cleaned with Betadine x 2.  Grasped anteriourly with a single tooth tenaculum.  Dilator used to open cervix.  Liletta IUD placed per manufacturer's recommendations.  Strings trimmed to 3 cm.   Patient given post procedure instructions and Liletta care card with expiration date.  Patient is asked to check IUD strings periodically and follow up in 4-6 weeks for IUD check.  Impression: Encounter for IUD removal and reinsertion - Plan: levonorgestrel (LILETTA) 18.6 MCG/DAY IUD   Plan: Continue IUD for 5 years.

## 2015-10-09 NOTE — Progress Notes (Deleted)
    Subjective:    Patient ID: Kristina Gardner is a 45 y.o. female presenting with IUD Reinsertion  on 10/09/2015  HPI:   Review of Systems    Objective:    BP 149/113 mmHg  Pulse 96  Resp 16  Ht 5\' 6"  (1.676 m)  Wt 214 lb 3.2 oz (97.16 kg)  BMI 34.59 kg/m2 Physical Exam    Subjective: Patient here for IUD change.  Has been in for 5 years and due to be changed. She has no cycles and desires to continue Mirena IUD for contraception.  Objective: Filed Vitals:   10/09/15 1328  BP: 149/113  Pulse: 96  Resp: 16    Patient identified, informed consent performed, signed copy in chart, time out was performed Procedure: Speculum placed inside vagina.  Cervix visualized.  Strings grasped with ring forceps.  IUD removed intact.  Procedure: Cervix visualized.  Cleaned with Betadine x 2.  Grasped anteriourly with a single tooth tenaculum.  Uterus sounded to *** cm.  Mirena IUD placed per manufacturer's recommendations.  Strings trimmed to 3 cm.   Patient given post procedure instructions and Mirena care card with expiration date.  Patient is asked to check IUD strings periodically and follow up in 4-6 weeks for IUD check.  Impression: Encounter for IUD removal and reinsertion - Plan: levonorgestrel (LILETTA) 18.6 MCG/DAY IUD   Plan: Continue IUD for 5 years.      Assessment & Plan:   Total face-to-face time with patient: *** minutes. Over 50% of encounter was spent on counseling and coordination of care. Return in about 4 weeks (around 11/06/2015) for iud check.  PRATT,TANYA S 10/09/2015 2:04 PM

## 2015-10-09 NOTE — Patient Instructions (Signed)
Levonorgestrel intrauterine device (IUD) What is this medicine? LEVONORGESTREL IUD (LEE voe nor jes trel) is a contraceptive (birth control) device. The device is placed inside the uterus by a healthcare professional. It is used to prevent pregnancy and can also be used to treat heavy bleeding that occurs during your period. Depending on the device, it can be used for 3 to 5 years. This medicine may be used for other purposes; ask your health care provider or pharmacist if you have questions. What should I tell my health care provider before I take this medicine? They need to know if you have any of these conditions: -abnormal Pap smear -cancer of the breast, uterus, or cervix -diabetes -endometritis -genital or pelvic infection now or in the past -have more than one sexual partner or your partner has more than one partner -heart disease -history of an ectopic or tubal pregnancy -immune system problems -IUD in place -liver disease or tumor -problems with blood clots or take blood-thinners -use intravenous drugs -uterus of unusual shape -vaginal bleeding that has not been explained -an unusual or allergic reaction to levonorgestrel, other hormones, silicone, or polyethylene, medicines, foods, dyes, or preservatives -pregnant or trying to get pregnant -breast-feeding How should I use this medicine? This device is placed inside the uterus by a health care professional. Talk to your pediatrician regarding the use of this medicine in children. Special care may be needed. Overdosage: If you think you have taken too much of this medicine contact a poison control center or emergency room at once. NOTE: This medicine is only for you. Do not share this medicine with others. What if I miss a dose? This does not apply. What may interact with this medicine? Do not take this medicine with any of the following medications: -amprenavir -bosentan -fosamprenavir This medicine may also interact with  the following medications: -aprepitant -barbiturate medicines for inducing sleep or treating seizures -bexarotene -griseofulvin -medicines to treat seizures like carbamazepine, ethotoin, felbamate, oxcarbazepine, phenytoin, topiramate -modafinil -pioglitazone -rifabutin -rifampin -rifapentine -some medicines to treat HIV infection like atazanavir, indinavir, lopinavir, nelfinavir, tipranavir, ritonavir -St. John's wort -warfarin This list may not describe all possible interactions. Give your health care provider a list of all the medicines, herbs, non-prescription drugs, or dietary supplements you use. Also tell them if you smoke, drink alcohol, or use illegal drugs. Some items may interact with your medicine. What should I watch for while using this medicine? Visit your doctor or health care professional for regular check ups. See your doctor if you or your partner has sexual contact with others, becomes HIV positive, or gets a sexual transmitted disease. This product does not protect you against HIV infection (AIDS) or other sexually transmitted diseases. You can check the placement of the IUD yourself by reaching up to the top of your vagina with clean fingers to feel the threads. Do not pull on the threads. It is a good habit to check placement after each menstrual period. Call your doctor right away if you feel more of the IUD than just the threads or if you cannot feel the threads at all. The IUD may come out by itself. You may become pregnant if the device comes out. If you notice that the IUD has come out use a backup birth control method like condoms and call your health care provider. Using tampons will not change the position of the IUD and are okay to use during your period. What side effects may I notice from receiving this medicine?   Side effects that you should report to your doctor or health care professional as soon as possible: -allergic reactions like skin rash, itching or  hives, swelling of the face, lips, or tongue -fever, flu-like symptoms -genital sores -high blood pressure -no menstrual period for 6 weeks during use -pain, swelling, warmth in the leg -pelvic pain or tenderness -severe or sudden headache -signs of pregnancy -stomach cramping -sudden shortness of breath -trouble with balance, talking, or walking -unusual vaginal bleeding, discharge -yellowing of the eyes or skin Side effects that usually do not require medical attention (report to your doctor or health care professional if they continue or are bothersome): -acne -breast pain -change in sex drive or performance -changes in weight -cramping, dizziness, or faintness while the device is being inserted -headache -irregular menstrual bleeding within first 3 to 6 months of use -nausea This list may not describe all possible side effects. Call your doctor for medical advice about side effects. You may report side effects to FDA at 1-800-FDA-1088. Where should I keep my medicine? This does not apply. NOTE: This sheet is a summary. It may not cover all possible information. If you have questions about this medicine, talk to your doctor, pharmacist, or health care provider.    2016, Elsevier/Gold Standard. (2011-05-22 13:54:04)  

## 2015-12-24 ENCOUNTER — Encounter: Payer: 59 | Admitting: Internal Medicine

## 2016-01-03 ENCOUNTER — Other Ambulatory Visit (INDEPENDENT_AMBULATORY_CARE_PROVIDER_SITE_OTHER): Payer: 59 | Admitting: *Deleted

## 2016-01-03 DIAGNOSIS — R350 Frequency of micturition: Secondary | ICD-10-CM | POA: Diagnosis not present

## 2016-01-03 LAB — POCT URINALYSIS DIPSTICK
BILIRUBIN UA: NEGATIVE
GLUCOSE UA: NEGATIVE
Ketones, UA: NEGATIVE
NITRITE UA: NEGATIVE
Protein, UA: NEGATIVE
Spec Grav, UA: 1.01
UROBILINOGEN UA: 0.2
pH, UA: 6.5

## 2016-01-03 MED ORDER — SULFAMETHOXAZOLE-TRIMETHOPRIM 800-160 MG PO TABS: 1.0000 | ORAL_TABLET | Freq: Two times a day (BID) | ORAL | 0 refills | Status: DC

## 2016-01-03 NOTE — Progress Notes (Signed)
dysuria

## 2016-01-06 LAB — CULTURE, URINE COMPREHENSIVE: Colony Count: >=100000

## 2016-01-25 ENCOUNTER — Other Ambulatory Visit: Payer: Self-pay | Admitting: Internal Medicine

## 2016-02-13 ENCOUNTER — Other Ambulatory Visit: Payer: Self-pay

## 2016-02-13 MED ORDER — DILTIAZEM HCL ER 180 MG PO CP24: 180.0000 mg | ORAL_CAPSULE | Freq: Every day | ORAL | 0 refills | Status: DC

## 2016-02-13 MED ORDER — LOSARTAN POTASSIUM-HCTZ 100-25 MG PO TABS: 1.0000 | ORAL_TABLET | Freq: Every day | ORAL | 0 refills | Status: DC

## 2016-02-13 MED ORDER — LEVOTHYROXINE SODIUM 125 MCG PO TABS: ORAL_TABLET | ORAL | 0 refills | Status: DC

## 2016-02-13 NOTE — Telephone Encounter (Signed)
Pt request refill diltiazem,levothyroxine and losartan-HCTZ # 30 to [facility] while waiting on mail order pharmacy delivery. Advised pt done. Last annual 03/26/15 and CPX scheduled  05/14/16.

## 2016-03-14 ENCOUNTER — Other Ambulatory Visit: Payer: Self-pay | Admitting: Internal Medicine

## 2016-05-14 ENCOUNTER — Ambulatory Visit (INDEPENDENT_AMBULATORY_CARE_PROVIDER_SITE_OTHER): Payer: 59 | Admitting: Internal Medicine

## 2016-05-14 ENCOUNTER — Encounter: Payer: Self-pay | Admitting: Internal Medicine

## 2016-05-14 VITALS — BP 122/78 | HR 88 | Temp 98.4°F | Ht 67.0 in | Wt 208.0 lb

## 2016-05-14 DIAGNOSIS — R079 Chest pain, unspecified: Secondary | ICD-10-CM | POA: Insufficient documentation

## 2016-05-14 DIAGNOSIS — Z0000 Encounter for general adult medical examination without abnormal findings: Secondary | ICD-10-CM | POA: Diagnosis not present

## 2016-05-14 DIAGNOSIS — M791 Myalgia, unspecified site: Secondary | ICD-10-CM

## 2016-05-14 DIAGNOSIS — I10 Essential (primary) hypertension: Secondary | ICD-10-CM

## 2016-05-14 DIAGNOSIS — E039 Hypothyroidism, unspecified: Secondary | ICD-10-CM

## 2016-05-14 NOTE — Assessment & Plan Note (Signed)
In arms No findings ??early CTS---discussed monitoring her hand position at work Will check labs

## 2016-05-14 NOTE — Assessment & Plan Note (Signed)
Healthy Discussed sleep hygiene and exercise Has had mammogram---not in chart though Colon screening starting at 50

## 2016-05-14 NOTE — Assessment & Plan Note (Addendum)
Vague and brief Atypical EKG broken here today Asked her to come back for any recurrent symptoms--and 911 if worrisome

## 2016-05-14 NOTE — Assessment & Plan Note (Signed)
Seems euthyroid Will recheck labs--did miss a week recently, could affect results

## 2016-05-14 NOTE — Progress Notes (Signed)
Subjective:    Patient ID: Kristina Gardner, female    DOB: [birth year], 46 y.o.   MRN: [id number]  HPI Here for physical  Having pain in arms-- "like I have been working out" Mostly in forearms but occasionally higher No obvious stressors Goes back 2 months or so Concerned it was her thyroid--tried off it for a week without help Hard to pick up even a ream of paper--has to use both hands No supplements or other medication No neck problems  Sleeps well in general--but awakens most nights at [place] back to sleep only some of the time Does sleep after work and sleeps 8-10PM---then goes back at [employer] Discussed working out or something else to keep her up in evening  Has to change gyn Dr Kennon Rounds not in network  Current Outpatient Prescriptions on File Prior to Visit  Medication Sig Dispense Refill  . diltiazem (DILT-XR) 180 MG 24 hr capsule Take 1 capsule (180 mg total) by mouth daily. 10 capsule 0  . fluconazole (DIFLUCAN) 150 MG tablet TAKE 1 TABLET BY MOUTH ONCE AS DIRECTED 2 tablet 0  . levothyroxine (SYNTHROID, LEVOTHROID) 125 MCG tablet TAKE 1 TABLET BY MOUTH  DAILY BEFORE BREAKFAST 90 tablet 0  . losartan-hydrochlorothiazide (HYZAAR) 100-25 MG tablet TAKE 1 TABLET BY MOUTH  DAILY 90 tablet 0   No current facility-administered medications on file prior to visit.     Allergies  Allergen Reactions  . Norvasc [Amlodipine Besylate] Other (See Comments)    dizziness    Past Medical History:  Diagnosis Date  . GERD (gastroesophageal reflux disease)   . Hypertension   . Leiomyoma of uterus, unspecified   . Obesity   . Pneumonia   . Unspecified hypothyroidism     Past Surgical History:  Procedure Laterality Date  . DILATION AND CURETTAGE OF UTERUS  2000    Family History  Problem Relation Age of Onset  . Hypertension Mother   . Cancer Mother     Bone Marrow   . Kidney disease Mother   . Arthritis Sister   . Cancer Paternal Grandfather     lung cancer  .  Cancer Maternal Aunt     from snuff  . Dementia Maternal Aunt     Social History   Social History  . Marital status: Single    Spouse name: N/A  . Number of children: 1  . Years of education: N/A   Occupational History  . [employer], customer service    Social History Main Topics  . Smoking status: Never Smoker  . Smokeless tobacco: Never Used  . Alcohol use Yes     Comment: occasional  . Drug use: No  . Sexual activity: Yes    Partners: Male    Birth control/ protection: Condom, IUD   Other Topics Concern  . Not on file   Social History Narrative   Live in boyfriend since ~2008. He has 2 children-- don't stay with them anymore     Review of Systems  Constitutional: Positive for fatigue.       Weight down slightly Wears seat belt  HENT: Positive for rhinorrhea and tinnitus. Negative for dental problem, hearing loss and trouble swallowing.        Tinnitus for 3 days after recent concert Constant rhinorrhea Keeps up with dentist  Eyes: Negative for visual disturbance.       No diplopia or unilateral vision loss  Respiratory: Negative for cough, chest tightness and shortness of breath.  Cardiovascular: Negative for palpitations and leg swelling.       Has had squeezing sensation in left chest-- happened twice about 3 weeks ago (at work). Not after eating No other symptoms No recent exercise  Gastrointestinal: Negative for blood in stool.       Lots of gas--some heartburn--takes omeprazole prn Bowels aren't regular--may be quick after food. Rare constipation  Endocrine: Negative for polydipsia and polyuria.  Genitourinary: Negative for dyspareunia, dysuria and hematuria.  Musculoskeletal: Positive for back pain and myalgias. Negative for arthralgias and joint swelling.  Skin: Negative for rash.       No suspicious lesions  Allergic/Immunologic: Positive for environmental allergies. Negative for immunocompromised state.       Hasn't been using meds--discussed    Neurological: Positive for headaches. Negative for dizziness, syncope and light-headedness.  Hematological: Negative for adenopathy. Does not bruise/bleed easily.  Psychiatric/Behavioral: Positive for sleep disturbance. Negative for dysphoric mood. The patient is not nervous/anxious.        Still grieves mom-- 4 years ago. Nothing serious       Objective:   Physical Exam  Constitutional: She is oriented to person, place, and time. She appears well-developed and well-nourished. No distress.  HENT:  Head: Normocephalic and atraumatic.  Right Ear: External ear normal.  Left Ear: External ear normal.  Mouth/Throat: Oropharynx is clear and moist. No oropharyngeal exudate.  Eyes: Conjunctivae are normal. Pupils are equal, round, and reactive to light.  Neck: Normal range of motion. Neck supple. No thyromegaly present.  Cardiovascular: Normal rate, regular rhythm, normal heart sounds and intact distal pulses.  Exam reveals no gallop.   No murmur heard. Pulmonary/Chest: Effort normal and breath sounds normal. No respiratory distress. She has no wheezes. She has no rales.  Abdominal: Soft. There is no tenderness.  Musculoskeletal: She exhibits no edema or tenderness.  No synovitis in arms/hands  Lymphadenopathy:    She has no cervical adenopathy.  Neurological: She is alert and oriented to person, place, and time.  No arm/hand weakness  Skin: No rash noted. No erythema.  Psychiatric: She has a normal mood and affect. Her behavior is normal.          Assessment & Plan:

## 2016-05-14 NOTE — Assessment & Plan Note (Signed)
BP Readings from Last 3 Encounters:  05/14/16 122/78  10/09/15 (!) 149/113  03/26/15 (!) 134/94   Good control now No changes needed

## 2016-05-14 NOTE — Progress Notes (Signed)
Pre visit review using our clinic review tool, if applicable. No additional management support is needed unless otherwise documented below in the visit note. 

## 2016-05-15 LAB — CBC WITH DIFFERENTIAL/PLATELET
BASOS ABS: 0 10*3/uL (ref 0.0–0.2)
Basos: 1 %
EOS (ABSOLUTE): 0.1 10*3/uL (ref 0.0–0.4)
Eos: 3 %
HEMOGLOBIN: 16.1 g/dL — AB (ref 11.1–15.9)
Hematocrit: 48.9 % — ABNORMAL HIGH (ref 34.0–46.6)
IMMATURE GRANS (ABS): 0 10*3/uL (ref 0.0–0.1)
Immature Granulocytes: 0 %
LYMPHS: 37 %
Lymphocytes Absolute: 1.8 10*3/uL (ref 0.7–3.1)
MCH: 28.3 pg (ref 26.6–33.0)
MCHC: 32.9 g/dL (ref 31.5–35.7)
MCV: 86 fL (ref 79–97)
MONOCYTES: 7 %
Monocytes Absolute: 0.3 10*3/uL (ref 0.1–0.9)
NEUTROS PCT: 52 %
Neutrophils Absolute: 2.6 10*3/uL (ref 1.4–7.0)
Platelets: 340 10*3/uL (ref 150–379)
RBC: 5.68 x10E6/uL — AB (ref 3.77–5.28)
RDW: 13.8 % (ref 12.3–15.4)
WBC: 4.9 10*3/uL (ref 3.4–10.8)

## 2016-05-15 LAB — LIPID PANEL
CHOL/HDL RATIO: 4.9 ratio — AB (ref 0.0–4.4)
Cholesterol, Total: 267 mg/dL — ABNORMAL HIGH (ref 100–199)
HDL: 54 mg/dL (ref >39)
LDL CALC: 188 mg/dL — AB (ref 0–99)
TRIGLYCERIDES: 127 mg/dL (ref 0–149)
VLDL Cholesterol Cal: 25 mg/dL (ref 5–40)

## 2016-05-15 LAB — COMPREHENSIVE METABOLIC PANEL
ALBUMIN: 4.7 g/dL (ref 3.5–5.5)
ALK PHOS: 118 IU/L — AB (ref 39–117)
ALT: 19 IU/L (ref 0–32)
AST: 15 IU/L (ref 0–40)
Albumin/Globulin Ratio: 1.4 (ref 1.2–2.2)
BUN / CREAT RATIO: 10 (ref 9–23)
BUN: 10 mg/dL (ref 6–24)
Bilirubin Total: 0.5 mg/dL (ref 0.0–1.2)
CO2: 25 mmol/L (ref 18–29)
CREATININE: 0.99 mg/dL (ref 0.57–1.00)
Calcium: 9.8 mg/dL (ref 8.7–10.2)
Chloride: 95 mmol/L — ABNORMAL LOW (ref 96–106)
GFR, EST AFRICAN AMERICAN: 78 mL/min/{1.73_m2} (ref >59)
GFR, EST NON AFRICAN AMERICAN: 68 mL/min/{1.73_m2} (ref >59)
GLOBULIN, TOTAL: 3.4 g/dL (ref 1.5–4.5)
GLUCOSE: 83 mg/dL (ref 65–99)
Potassium: 3.7 mmol/L (ref 3.5–5.2)
SODIUM: 138 mmol/L (ref 134–144)
TOTAL PROTEIN: 8.1 g/dL (ref 6.0–8.5)

## 2016-05-15 LAB — SEDIMENTATION RATE: SED RATE: 6 mm/h (ref 0–32)

## 2016-05-15 LAB — T4, FREE: FREE T4: 0.97 ng/dL (ref 0.82–1.77)

## 2016-05-15 LAB — CK: Total CK: 187 U/L — ABNORMAL HIGH (ref 24–173)

## 2016-05-15 LAB — TSH: TSH: 30.91 u[IU]/mL — AB (ref 0.450–4.500)

## 2016-05-20 ENCOUNTER — Other Ambulatory Visit: Payer: Self-pay | Admitting: Internal Medicine

## 2016-05-20 DIAGNOSIS — E039 Hypothyroidism, unspecified: Secondary | ICD-10-CM

## 2016-06-12 ENCOUNTER — Other Ambulatory Visit (INDEPENDENT_AMBULATORY_CARE_PROVIDER_SITE_OTHER): Payer: 59

## 2016-06-12 DIAGNOSIS — E039 Hypothyroidism, unspecified: Secondary | ICD-10-CM | POA: Diagnosis not present

## 2016-06-12 NOTE — Addendum Note (Signed)
Addended by: Ellamae Sia on: 06/12/2016 08:57 AM   Modules accepted: Orders

## 2016-06-13 LAB — T4, FREE: Free T4: 1.18 ng/dL (ref 0.82–1.77)

## 2016-06-13 LAB — TSH: TSH: 9.58 u[IU]/mL — AB (ref 0.450–4.500)

## 2016-06-25 ENCOUNTER — Telehealth: Payer: Self-pay

## 2016-06-25 DIAGNOSIS — E039 Hypothyroidism, unspecified: Secondary | ICD-10-CM

## 2016-06-25 MED ORDER — LEVOTHYROXINE SODIUM 100 MCG PO TABS: 100.0000 ug | ORAL_TABLET | Freq: Every day | ORAL | 3 refills | Status: DC

## 2016-06-25 NOTE — Telephone Encounter (Signed)
Made lab appt Sep 19, 2016. Please place future orders for Thyroid Labs

## 2016-06-25 NOTE — Telephone Encounter (Signed)
Left message on vm per dpr that pt needed to take 172mcg and repeat labs in 2 months or 4-5 months at an OV. I have sent in a new rx for the 183mcg.

## 2016-06-26 NOTE — Telephone Encounter (Signed)
Orders placed.

## 2016-06-26 NOTE — Addendum Note (Signed)
Addended by: Viviana Simpler I on: 06/26/2016 11:03 AM   Modules accepted: Orders

## 2016-07-30 ENCOUNTER — Encounter: Payer: Self-pay | Admitting: Internal Medicine

## 2016-07-31 ENCOUNTER — Telehealth: Payer: Self-pay | Admitting: *Deleted

## 2016-07-31 DIAGNOSIS — N760 Acute vaginitis: Principal | ICD-10-CM

## 2016-07-31 DIAGNOSIS — B9689 Other specified bacterial agents as the cause of diseases classified elsewhere: Secondary | ICD-10-CM

## 2016-07-31 MED ORDER — METRONIDAZOLE 500 MG PO TABS
500.0000 mg | ORAL_TABLET | Freq: Two times a day (BID) | ORAL | 0 refills | Status: DC
Start: 2016-07-31 — End: 2016-08-12

## 2016-07-31 NOTE — Telephone Encounter (Signed)
Pt called the office experiencing symptoms associated with a bacterial infection, Flagyl sent to pharmacy and pt instructed on medication use.

## 2016-07-31 NOTE — Telephone Encounter (Signed)
-----   Message from Blanchie Dessert, [place] sent at 07/31/2016  1:37 PM EDT ----- Regarding: rx request for BV Contact: [phone] Pt is [facility] [address] [facility] [place],

## 2016-08-12 ENCOUNTER — Ambulatory Visit (INDEPENDENT_AMBULATORY_CARE_PROVIDER_SITE_OTHER): Payer: 59 | Admitting: Family Medicine

## 2016-08-12 ENCOUNTER — Encounter: Payer: Self-pay | Admitting: Family Medicine

## 2016-08-12 DIAGNOSIS — D250 Submucous leiomyoma of uterus: Secondary | ICD-10-CM | POA: Diagnosis not present

## 2016-08-12 DIAGNOSIS — D251 Intramural leiomyoma of uterus: Secondary | ICD-10-CM

## 2016-08-12 NOTE — Assessment & Plan Note (Signed)
?   If she is infarcting a fibroid. Usual course reviewed.

## 2016-08-12 NOTE — Progress Notes (Signed)
    Subjective:    Patient ID: Kristina Gardner is a 46 y.o. female presenting with Pelvic Pain  on 08/12/2016  HPI: Patient has h/o BV and placed on meds last week. She has had cramping since that time. Completed course of Flagyl and no further symptoms of infection. No new sexual partner. Taking aleve, which has helped some. Goes away when at home and comes back while working. Denies vaginal discharge.  Review of Systems  Constitutional: Negative for chills and fever.  Respiratory: Negative for shortness of breath.   Cardiovascular: Negative for chest pain.  Gastrointestinal: Negative for abdominal pain, nausea and vomiting.  Genitourinary: Negative for dysuria.  Skin: Negative for rash.      Objective:    BP (!) 163/113 (BP Location: Left Arm, Patient Position: Sitting, Cuff Size: Large)   Pulse 87   Resp 20   Wt 210 lb (95.3 kg)   BMI 32.89 kg/m  Physical Exam  Constitutional: She is oriented to person, place, and time. She appears well-developed and well-nourished. No distress.  HENT:  Head: Normocephalic and atraumatic.  Eyes: No scleral icterus.  Neck: Neck supple.  Cardiovascular: Normal rate.   Pulmonary/Chest: Effort normal.  Abdominal: Soft.  Genitourinary:  Genitourinary Comments: Uterus is tender 14-16 wk size, lobular consistent with fibroid.  Neurological: She is alert and oriented to person, place, and time.  Skin: Skin is warm and dry.  Psychiatric: She has a normal mood and affect.        Assessment & Plan:   Problem List Items Addressed This Visit      Unprioritized   Leiomyoma of uterus    ? If she is infarcting a fibroid. Usual course reviewed.         Total face-to-face time with patient: 15 minutes. Over 50% of encounter was spent on counseling and coordination of care. Return in about 4 weeks (around 09/09/2016).  Donnamae Jude 08/12/2016 2:20 PM

## 2016-08-12 NOTE — Patient Instructions (Addendum)

## 2016-08-12 NOTE — Progress Notes (Signed)
Pt was recently treated for BV, states she has been experiencing cramps after starting the Flagyl which have continued after treatment.

## 2016-08-13 ENCOUNTER — Ambulatory Visit: Payer: 59 | Admitting: *Deleted

## 2016-08-13 VITALS — BP 152/102

## 2016-08-13 DIAGNOSIS — I10 Essential (primary) hypertension: Secondary | ICD-10-CM

## 2016-08-13 NOTE — Progress Notes (Signed)
BP: 152/102 Subjective:  Kristina Gardner is a 46 y.o. female with hypertension. Current Outpatient Prescriptions  Medication Sig Dispense Refill  . DILT-XR 180 MG 24 hr capsule TAKE 1 CAPSULE BY MOUTH  DAILY 90 capsule 2  . levothyroxine (SYNTHROID, LEVOTHROID) 100 MCG tablet Take 1 tablet (100 mcg total) by mouth daily. 90 tablet 3  . losartan-hydrochlorothiazide (HYZAAR) 100-25 MG tablet TAKE 1 TABLET BY MOUTH  DAILY 90 tablet 2   No current facility-administered medications for this visit.     Hypertension ROS: taking medications as instructed, no medication side effects noted, no TIA's, no chest pain on exertion, no dyspnea on exertion and no swelling of ankles.  New concerns: None.   Objective:  BP (!) 152/102   Appearance alert, well appearing, and in no distress. General exam BP noted to be moderately elevated today in office.    Assessment:   Hypertension   Plan:  Reviewed diet, exercise and weight control. Follow Up As Needed  Phoned attending, Dr. Kennon Rounds, and reviewed BP in office today

## 2016-08-14 ENCOUNTER — Other Ambulatory Visit: Payer: Self-pay | Admitting: Internal Medicine

## 2016-09-19 ENCOUNTER — Other Ambulatory Visit (INDEPENDENT_AMBULATORY_CARE_PROVIDER_SITE_OTHER): Payer: 59

## 2016-09-19 DIAGNOSIS — E039 Hypothyroidism, unspecified: Secondary | ICD-10-CM

## 2016-09-19 NOTE — Addendum Note (Signed)
Addended by: Ellamae Sia on: 09/19/2016 08:13 AM   Modules accepted: Orders

## 2016-09-20 LAB — TSH: TSH: 19.35 u[IU]/mL — ABNORMAL HIGH (ref 0.450–4.500)

## 2016-09-20 LAB — T4, FREE: Free T4: 1.03 ng/dL (ref 0.82–1.77)

## 2016-09-24 ENCOUNTER — Telehealth: Payer: Self-pay | Admitting: Internal Medicine

## 2016-09-24 NOTE — Telephone Encounter (Signed)
Patient returned Shannon's call about lab results.

## 2016-10-30 ENCOUNTER — Telehealth: Payer: Self-pay | Admitting: *Deleted

## 2016-10-30 DIAGNOSIS — N760 Acute vaginitis: Principal | ICD-10-CM

## 2016-10-30 DIAGNOSIS — B9689 Other specified bacterial agents as the cause of diseases classified elsewhere: Secondary | ICD-10-CM

## 2016-10-30 MED ORDER — METRONIDAZOLE 500 MG PO TABS: 500.0000 mg | ORAL_TABLET | Freq: Two times a day (BID) | ORAL | 0 refills | Status: DC

## 2016-10-30 NOTE — Telephone Encounter (Signed)
Pt called c/o a milky white discharge with a foul smelling odor.  Denies and itching or irritation.  Flagyl sent to pharmacy and instructed pt on medication use.

## 2016-11-03 ENCOUNTER — Other Ambulatory Visit (INDEPENDENT_AMBULATORY_CARE_PROVIDER_SITE_OTHER): Payer: 59

## 2016-11-03 DIAGNOSIS — E038 Other specified hypothyroidism: Secondary | ICD-10-CM | POA: Diagnosis not present

## 2016-11-04 LAB — TSH: TSH: 0.623 u[IU]/mL (ref 0.450–4.500)

## 2016-12-10 ENCOUNTER — Encounter: Payer: Self-pay | Admitting: Primary Care

## 2016-12-10 ENCOUNTER — Ambulatory Visit (INDEPENDENT_AMBULATORY_CARE_PROVIDER_SITE_OTHER): Payer: 59 | Admitting: Primary Care

## 2016-12-10 VITALS — BP 122/78 | HR 87 | Temp 98.4°F | Wt 209.1 lb

## 2016-12-10 DIAGNOSIS — R112 Nausea with vomiting, unspecified: Secondary | ICD-10-CM

## 2016-12-10 DIAGNOSIS — R6883 Chills (without fever): Secondary | ICD-10-CM

## 2016-12-10 DIAGNOSIS — R42 Dizziness and giddiness: Secondary | ICD-10-CM | POA: Diagnosis not present

## 2016-12-10 NOTE — Progress Notes (Signed)
Subjective:    Patient ID: Kristina Gardner, female    DOB: [birth year], 46 y.o.   MRN: [id number]  HPI  Kristina Gardner is a 46 year old female who presents today with a chief complaint of chills. She also reports dizziness/lightheadedness, night sweats, one episode of vomiting. Her symptoms began early Saturday morning around 1 am. She had one episode of vomiting Saturday morning with one episode of sharp LLQ abdominal pain just after vomiting. She denies diarrhea, bloody stools, sick contacts. Most of her symptoms subsided Saturday afternoon, did experience night sweats Sunday night. Her lightheadedness did occur last night, no lightheadedness today. Overall she's feeling much better.   She's able to eat and drink without difficulty. She denies all symptoms today. She's been increasing water intake and has taken Excedrin Migraine for a headache with relief.   Review of Systems  Constitutional: Negative for appetite change, chills, fatigue and fever.  Respiratory: Negative for shortness of breath.   Cardiovascular: Negative for chest pain.  Gastrointestinal: Negative for abdominal pain, diarrhea and nausea.  Neurological: Negative for dizziness and headaches.       Past Medical History:  Diagnosis Date  . GERD (gastroesophageal reflux disease)   . Hypertension   . Leiomyoma of uterus, unspecified   . Obesity   . Pneumonia   . Unspecified hypothyroidism      Social History   Social History  . Marital status: Single    Spouse name: N/A  . Number of children: 1  . Years of education: N/A   Occupational History  . [employer], customer service    Social History Main Topics  . Smoking status: Never Smoker  . Smokeless tobacco: Never Used  . Alcohol use Yes     Comment: occasional  . Drug use: No  . Sexual activity: Yes    Partners: Male    Birth control/ protection: Condom, IUD   Other Topics Concern  . Not on file   Social History Narrative   Live in boyfriend  since ~2008. He has 2 children-- don't stay with them anymore    Past Surgical History:  Procedure Laterality Date  . DILATION AND CURETTAGE OF UTERUS  2000    Family History  Problem Relation Age of Onset  . Hypertension Mother   . Cancer Mother        Bone Marrow   . Kidney disease Mother   . Arthritis Sister   . Cancer Paternal Grandfather        lung cancer  . Cancer Maternal Aunt        from snuff  . Dementia Maternal Aunt   . Graves' disease Other        multiple    Allergies  Allergen Reactions  . Norvasc [Amlodipine Besylate] Other (See Comments)    dizziness    Current Outpatient Prescriptions on File Prior to Visit  Medication Sig Dispense Refill  . DILT-XR 180 MG 24 hr capsule TAKE 1 CAPSULE BY MOUTH  DAILY 90 capsule 2  . levothyroxine (SYNTHROID, LEVOTHROID) 100 MCG tablet Take 1 tablet (100 mcg total) by mouth daily. 90 tablet 3  . losartan-hydrochlorothiazide (HYZAAR) 100-25 MG tablet TAKE 1 TABLET BY MOUTH  DAILY 90 tablet 2   No current facility-administered medications on file prior to visit.     BP 122/78   Pulse 87   Temp 98.4 F (36.9 C) (Oral)   Wt 209 lb 1.9 oz (94.9 kg)   SpO2 98%  BMI 32.75 kg/m    Objective:   Physical Exam  Constitutional: She appears well-nourished. She does not appear ill.  Neck: Neck supple.  Cardiovascular: Normal rate and regular rhythm.   Pulmonary/Chest: Effort normal and breath sounds normal.  Abdominal: Soft. Bowel sounds are normal. There is no tenderness.  Skin: Skin is warm and dry.          Assessment & Plan:  Chills:  Also with lightheadedness, one episode of vomiting, one episode of abdominal cramping just after vomiting, night sweats. All symptoms have subsided, feeling better today. Suspect viral GI involvement. Appears well today, exam unremarkable. Vitals normal. Continue to push intake of water for hydration. Return precautions provided.  Sheral Flow, NP

## 2016-12-10 NOTE — Patient Instructions (Signed)
Continue to work on hydration with water intake.  You may take Tylenol, Ibuprofen as needed for headaches or if any fevers develop.  Please call me if you start to experience diarrhea, vomiting, fevers, and/or if you feel worse.  It was a pleasure meeting you!

## 2017-01-13 ENCOUNTER — Encounter: Payer: Self-pay | Admitting: Internal Medicine

## 2017-01-13 MED ORDER — LEVOTHYROXINE SODIUM 100 MCG PO TABS: 100.0000 ug | ORAL_TABLET | Freq: Every day | ORAL | 0 refills | Status: DC

## 2017-03-22 ENCOUNTER — Encounter: Payer: Self-pay | Admitting: Internal Medicine

## 2017-03-22 DIAGNOSIS — E039 Hypothyroidism, unspecified: Secondary | ICD-10-CM

## 2017-03-24 ENCOUNTER — Other Ambulatory Visit (INDEPENDENT_AMBULATORY_CARE_PROVIDER_SITE_OTHER): Payer: 59

## 2017-03-24 DIAGNOSIS — E039 Hypothyroidism, unspecified: Secondary | ICD-10-CM

## 2017-03-25 LAB — TSH: TSH: 0.812 u[IU]/mL (ref 0.450–4.500)

## 2017-03-25 LAB — T4, FREE: FREE T4: 1.53 ng/dL (ref 0.82–1.77)

## 2017-04-19 ENCOUNTER — Encounter: Payer: Self-pay | Admitting: Family Medicine

## 2017-04-20 MED ORDER — FLUCONAZOLE 150 MG PO TABS: 150.0000 mg | ORAL_TABLET | ORAL | 3 refills | Status: DC

## 2017-04-27 ENCOUNTER — Emergency Department: Payer: 59

## 2017-04-27 ENCOUNTER — Emergency Department
Admission: EM | Admit: 2017-04-27 | Discharge: 2017-04-27 | Disposition: A | Discharge status: Home / Self Care | Payer: 59 | Attending: Emergency Medicine | Admitting: Emergency Medicine

## 2017-04-27 DIAGNOSIS — Z79899 Other long term (current) drug therapy: Secondary | ICD-10-CM | POA: Insufficient documentation

## 2017-04-27 DIAGNOSIS — I10 Essential (primary) hypertension: Secondary | ICD-10-CM | POA: Insufficient documentation

## 2017-04-27 DIAGNOSIS — M25552 Pain in left hip: Secondary | ICD-10-CM | POA: Diagnosis present

## 2017-04-27 MED ORDER — ONDANSETRON 4 MG PO TBDP
4.0000 mg | ORAL_TABLET | Freq: Once | ORAL | Status: AC
  Administered 2017-04-27: 4 mg via ORAL

## 2017-04-27 MED ORDER — OXYCODONE-ACETAMINOPHEN 5-325 MG PO TABS
2.0000 | ORAL_TABLET | Freq: Once | ORAL | Status: AC
  Administered 2017-04-27: 2 via ORAL
  Filled 2017-04-27: qty 2

## 2017-04-27 MED ORDER — ONDANSETRON 4 MG PO TBDP
ORAL_TABLET | ORAL | Status: AC
  Administered 2017-04-27: 4 mg via ORAL
  Filled 2017-04-27: qty 1

## 2017-04-27 MED ORDER — OXYCODONE-ACETAMINOPHEN 5-325 MG PO TABS: 1.0000 | ORAL_TABLET | Freq: Four times a day (QID) | ORAL | 0 refills | Status: DC | PRN

## 2017-04-27 NOTE — ED Notes (Signed)
Patient reported to this RN that she was able to walk and tolerate pain level while RN out of the room, however, dizziness has not resolved. MD informed

## 2017-04-27 NOTE — ED Triage Notes (Signed)
Pt states awoke last night with left hip pain. Pt states she is not able to bear weight on left leg at this time. Pt assisted out of car into wheelchair. Pt denies tingling or numbness in toes.

## 2017-04-27 NOTE — ED Notes (Signed)
ED Provider at bedside. 

## 2017-04-27 NOTE — ED Notes (Signed)
Patient c/o left hip pain radiating down left leg described as numbness. Patient has equal sensation to bilateral upper and lower extremities. Patient reports that the pain increases with ambulation.    Patient reports that yesterday she went to an event and was sitting for a long period. Patient reports pain upon standing after event end, that has steadily increasingly since.

## 2017-04-27 NOTE — ED Notes (Signed)
AAOx3.  Skin warm and dry.. Nausea improved.  Patient c/o slight dizziness, but states symptoms are similar to when she took 2 tabs percocet earlier today.  MAE equally and strong.  Ambulates with steady gait.  Skin warm and dry.

## 2017-04-27 NOTE — ED Provider Notes (Signed)
[facility] Emergency Department Provider Note  Time seen: 3:59 AM  I have reviewed the triage vital signs and the nursing notes.   HISTORY  Chief Complaint Hip Pain    HPI Kristina Gardner is a 46 y.o. female with a past medical history of hypertension, gastric reflux, obesity, presents to the emergency department with left hip pain.  According to the patient yesterday they were sitting on very low chairs, states it was somewhat uncomfortable when she stood up she felt stiffness in the left hip.  Patient states this morning she awoke was having increased pain and stiffness in the leg, but she got out of bed to walk around to see if it would help.  But states the pain worsens significantly to the point where she was unable to bear weight on the left hip due to pain.  The patient denies any falls or trauma.  Review of systems otherwise negative.  Patient states her pain is a 7/10 lying still in bed worse with any movement.   Past Medical History:  Diagnosis Date  . GERD (gastroesophageal reflux disease)   . Hypertension   . Leiomyoma of uterus, unspecified   . Obesity   . Pneumonia   . Unspecified hypothyroidism     Patient Active Problem List   Diagnosis Date Noted  . Myalgia 05/14/2016  . Chest pain 05/14/2016  . Obesity 02/07/2014  . Routine general medical examination at a health care facility 07/25/2011  . Menorrhagia 06/17/2011  . Hypothyroidism 09/07/2009  . Leiomyoma of uterus 10/18/2008  . GERD 10/18/2008  . Essential hypertension, benign 07/22/2006    Past Surgical History:  Procedure Laterality Date  . DILATION AND CURETTAGE OF UTERUS  2000    Prior to Admission medications   Medication Sig Start Date End Date Taking? Authorizing Provider  DILT-XR 180 MG 24 hr capsule TAKE 1 CAPSULE BY MOUTH  DAILY 08/14/16  Yes Venia Carbon, MD  fluconazole (DIFLUCAN) 150 MG tablet Take 1 tablet (150 mg total) by mouth every 3 (three) days. For  three doses 04/20/17  Yes Anyanwu, Ugonna A, MD  levothyroxine (SYNTHROID, LEVOTHROID) 100 MCG tablet Take 1 tablet (100 mcg total) by mouth daily. 01/13/17  Yes Venia Carbon, MD  losartan-hydrochlorothiazide (HYZAAR) 100-25 MG tablet TAKE 1 TABLET BY MOUTH  DAILY 08/14/16  Yes Venia Carbon, MD    Allergies  Allergen Reactions  . Norvasc [Amlodipine Besylate] Other (See Comments)    dizziness    Family History  Problem Relation Age of Onset  . Hypertension Mother   . Cancer Mother        Bone Marrow   . Kidney disease Mother   . Arthritis Sister   . Cancer Paternal Grandfather        lung cancer  . Cancer Maternal Aunt        from snuff  . Dementia Maternal Aunt   . Graves' disease Other        multiple    Social History Social History   Tobacco Use  . Smoking status: Never Smoker  . Smokeless tobacco: Never Used  Substance Use Topics  . Alcohol use: Yes    Comment: occasional  . Drug use: No    Review of Systems Constitutional: Negative for fever. Cardiovascular: Negative for chest pain. Respiratory: Negative for shortness of breath. Gastrointestinal: Negative for abdominal pain, vomiting  Musculoskeletal: Left hip pain Neurological: Negative for headache All other ROS negative  ____________________________________________  PHYSICAL EXAM:  VITAL SIGNS: ED Triage Vitals  Enc Vitals Group     BP 04/27/17 0320 (!) 144/84     Pulse Rate 04/27/17 0320 99     Resp 04/27/17 0320 20     Temp 04/27/17 0320 98.4 F (36.9 C)     Temp Source 04/27/17 0320 Oral     SpO2 04/27/17 0320 97 %     Weight 04/27/17 0316 215 lb (97.5 kg)     Height 04/27/17 0316 5\' 7"  (1.702 m)     Head Circumference --      Peak Flow --      Pain Score 04/27/17 0316 10     Pain Loc --      Pain Edu? --      Excl. in [place]? --    Constitutional: Alert and oriented. Well appearing and in no distress. Eyes: Normal exam ENT   Head: Normocephalic and atraumatic.    Mouth/Throat: Mucous membranes are moist. Cardiovascular: Normal rate, regular rhythm. No murmur Respiratory: Normal respiratory effort without tachypnea nor retractions. Breath sounds are clear  Gastrointestinal: Soft and nontender. No distention.   Musculoskeletal: Moderate left hip tenderness to palpation, significant pain with any attempted range of motion.  Neurovascular intact distally.  Good range of motion right lower extremity without pain. Neurologic:  Normal speech and language. No gross focal neurologic deficits Skin:  Skin is warm, dry and intact.  Psychiatric: Mood and affect are normal.  ____________________________________________     RADIOLOGY  X-ray negative. CT negative  ____________________________________________   INITIAL IMPRESSION / ASSESSMENT AND PLAN / ED COURSE  Pertinent labs & imaging results that were available during my care of the patient were reviewed by me and considered in my medical decision making (see chart for details).  Patient presents to the emergency department for left hip pain.  Differential would include fracture, dislocation, arthritis, contusion.  We will obtain an x-ray to further evaluate.  Offered IV pain medication patient would prefer pills we will dose to Percocet in the emergency department while awaiting results.  X-ray is negative for fracture.  Given the patient's continued significant pain worse with range of motion will obtain a CT scan to further evaluate. CT scan is negative.  Patient has received Percocet, will attempt to ambulate in the emergency department.  Patient was able to ambulate, states the pain is much improved.  We will discharge with Percocet and orthopedic follow-up.  Patient agreeable with this plan of care.  ____________________________________________   FINAL CLINICAL IMPRESSION(S) / ED DIAGNOSES  Left hip pain    Harvest Dark, MD 04/27/17 [phone]

## 2017-04-27 NOTE — ED Notes (Signed)
RN to bedside to attempt to ambulate patient. Patient c/o dizziness. Patient unable to ambulate due to dizziness. Patient reports to this RN that she has not eaten since 17:30-18:00 yesterday. MD informed. Patient provided a snack/meal and asked to eat per MD order.  Patient eating when this RN left room.

## 2017-04-28 ENCOUNTER — Encounter: Payer: Self-pay | Admitting: Internal Medicine

## 2017-04-30 ENCOUNTER — Other Ambulatory Visit: Payer: Self-pay | Admitting: Internal Medicine

## 2017-05-15 ENCOUNTER — Encounter: Payer: Self-pay | Admitting: Internal Medicine

## 2017-05-15 ENCOUNTER — Ambulatory Visit (INDEPENDENT_AMBULATORY_CARE_PROVIDER_SITE_OTHER): Payer: 59 | Admitting: Internal Medicine

## 2017-05-15 VITALS — BP 114/88 | HR 96 | Temp 98.3°F | Ht 67.0 in | Wt 210.0 lb

## 2017-05-15 DIAGNOSIS — Z0000 Encounter for general adult medical examination without abnormal findings: Secondary | ICD-10-CM

## 2017-05-15 DIAGNOSIS — E785 Hyperlipidemia, unspecified: Secondary | ICD-10-CM

## 2017-05-15 DIAGNOSIS — I10 Essential (primary) hypertension: Secondary | ICD-10-CM

## 2017-05-15 NOTE — Assessment & Plan Note (Signed)
Healthy but needs to work on fitness Keeps up with gyn Colon screening at 74 Prefers not to take flu vaccine

## 2017-05-15 NOTE — Progress Notes (Signed)
Subjective:    Patient ID: Kristina Gardner, female    DOB: 05/02/1971, 47 y.o.   MRN: [id number]  HPI Here for physical  Concerned about arthritis Pain in right 4th and 5th MCPs--and in hips Hips hurt after sitting and then standing up--improves after moving around Hand pain is intermittent---?related to writing Got oxycodone in ER----couldn't take it Ibuprofen helps (400-600mg ) No fitness efforts--has been working more lately  Still sees Dr Kennon Rounds Ongoing follow up for fibroid--may have infarcted No periods Brief hot flashes--seems to be better  Continues on the BP meds  Current Outpatient Medications on File Prior to Visit  Medication Sig Dispense Refill  . DILT-XR 180 MG 24 hr capsule TAKE 1 CAPSULE BY MOUTH  DAILY 90 capsule 3  . levothyroxine (SYNTHROID, LEVOTHROID) 100 MCG tablet TAKE 1 TABLET BY MOUTH  DAILY 90 tablet 3  . losartan-hydrochlorothiazide (HYZAAR) 100-25 MG tablet TAKE 1 TABLET BY MOUTH  DAILY 90 tablet 3   No current facility-administered medications on file prior to visit.     Allergies  Allergen Reactions  . Norvasc [Amlodipine Besylate] Other (See Comments)    dizziness    Past Medical History:  Diagnosis Date  . GERD (gastroesophageal reflux disease)   . Hypertension   . Leiomyoma of uterus, unspecified   . Obesity   . Pneumonia   . Unspecified hypothyroidism     Past Surgical History:  Procedure Laterality Date  . DILATION AND CURETTAGE OF UTERUS  2000    Family History  Problem Relation Age of Onset  . Hypertension Mother   . Cancer Mother        Bone Marrow   . Kidney disease Mother   . Arthritis Sister   . Cancer Paternal Grandfather        lung cancer  . Cancer Maternal Aunt        from snuff  . Dementia Maternal Aunt   . Graves' disease Other        multiple    Social History   Socioeconomic History  . Marital status: Single    Spouse name: Not on file  . Number of children: 1  . Years of education: Not on  file  . Highest education level: Not on file  Social Needs  . Financial resource strain: Not on file  . Food insecurity - worry: Not on file  . Food insecurity - inability: Not on file  . Transportation needs - medical: Not on file  . Transportation needs - non-medical: Not on file  Occupational History  . Occupation: [employer], Therapist, art  Tobacco Use  . Smoking status: Never Smoker  . Smokeless tobacco: Never Used  Substance and Sexual Activity  . Alcohol use: Yes    Comment: occasional  . Drug use: No  . Sexual activity: Yes    Partners: Male    Birth control/protection: Condom, IUD  Other Topics Concern  . Not on file  Social History Narrative   Live in boyfriend since ~2008. He has 2 children-- don't stay with them anymore   Review of Systems  Constitutional: Negative for fatigue and unexpected weight change.       Wears seat belt  HENT: Positive for tinnitus. Negative for dental problem, hearing loss and trouble swallowing.        Keeps up with dentist  Eyes: Negative for visual disturbance.       No diplopia or unilateral vision loss  Respiratory: Negative for cough, chest tightness and  shortness of breath.   Cardiovascular: Negative for chest pain, palpitations and leg swelling.  Gastrointestinal: Positive for constipation.       Some GI upset-- seems to be related to bowels Occasional heartburn---prilosec prn  Endocrine: Negative for polydipsia and polyuria.  Genitourinary: Negative for dyspareunia, dysuria and hematuria.       Notes dark, smelly urine at times.   Musculoskeletal: Positive for arthralgias. Negative for back pain and joint swelling.       Some pain along lateral right calf at times  Skin: Negative for rash.  Allergic/Immunologic: Negative for environmental allergies and immunocompromised state.  Neurological: Negative for dizziness, syncope and light-headedness.       Had AM headaches for a while--better now  Hematological: Negative for  adenopathy. Does not bruise/bleed easily.  Psychiatric/Behavioral: Negative for dysphoric mood and sleep disturbance.       Was irritated with boyfriend when she had the hot flashes       Objective:   Physical Exam  Constitutional: She is oriented to person, place, and time. She appears well-developed. No distress.  HENT:  Head: Normocephalic and atraumatic.  Right Ear: External ear normal.  Left Ear: External ear normal.  Mouth/Throat: Oropharynx is clear and moist. No oropharyngeal exudate.  Eyes: Conjunctivae are normal. Pupils are equal, round, and reactive to light.  Neck: No thyromegaly present.  Cardiovascular: Normal rate, regular rhythm, normal heart sounds and intact distal pulses. Exam reveals no gallop.  No murmur heard. Pulmonary/Chest: Effort normal and breath sounds normal. No respiratory distress. She has no wheezes. She has no rales.  Abdominal: Soft. She exhibits no distension. There is no tenderness.  Musculoskeletal: She exhibits no edema or tenderness.  Normal ROM in hips No active synovitis in hands---tendon did move across right 5th MCP causing pain  Lymphadenopathy:    She has no cervical adenopathy.  Neurological: She is alert and oriented to person, place, and time.  Skin: No rash noted. No erythema.  Psychiatric: She has a normal mood and affect. Her behavior is normal.          Assessment & Plan:

## 2017-05-15 NOTE — Patient Instructions (Signed)
Consider miralax (polyethylene glycol)--- 1 capful in full glass of water every day to 2 days, to keep your bowels regular.

## 2017-05-15 NOTE — Assessment & Plan Note (Signed)
BP Readings from Last 3 Encounters:  05/15/17 114/88  04/27/17 (!) 148/98  12/10/16 122/78   Good control Due for labs

## 2017-05-15 NOTE — Assessment & Plan Note (Signed)
Discussed Rx---will hold off for now

## 2017-05-16 LAB — LIPID PANEL
CHOL/HDL RATIO: 4.4 ratio (ref 0.0–4.4)
CHOLESTEROL TOTAL: 234 mg/dL — AB (ref 100–199)
HDL: 53 mg/dL (ref >39)
LDL Calculated: 155 mg/dL — ABNORMAL HIGH (ref 0–99)
Triglycerides: 128 mg/dL (ref 0–149)
VLDL Cholesterol Cal: 26 mg/dL (ref 5–40)

## 2017-05-16 LAB — CBC
HEMATOCRIT: 46.5 % (ref 34.0–46.6)
HEMOGLOBIN: 15.4 g/dL (ref 11.1–15.9)
MCH: 28.7 pg (ref 26.6–33.0)
MCHC: 33.1 g/dL (ref 31.5–35.7)
MCV: 87 fL (ref 79–97)
Platelets: 355 10*3/uL (ref 150–379)
RBC: 5.36 x10E6/uL — ABNORMAL HIGH (ref 3.77–5.28)
RDW: 14.3 % (ref 12.3–15.4)
WBC: 4.7 10*3/uL (ref 3.4–10.8)

## 2017-05-16 LAB — COMPREHENSIVE METABOLIC PANEL
A/G RATIO: 1.3 (ref 1.2–2.2)
ALBUMIN: 4.6 g/dL (ref 3.5–5.5)
ALK PHOS: 142 IU/L — AB (ref 39–117)
ALT: 15 IU/L (ref 0–32)
AST: 15 IU/L (ref 0–40)
BILIRUBIN TOTAL: 0.6 mg/dL (ref 0.0–1.2)
BUN/Creatinine Ratio: 12 (ref 9–23)
BUN: 13 mg/dL (ref 6–24)
CHLORIDE: 99 mmol/L (ref 96–106)
CO2: 24 mmol/L (ref 20–29)
Calcium: 9.9 mg/dL (ref 8.7–10.2)
Creatinine, Ser: 1.06 mg/dL — ABNORMAL HIGH (ref 0.57–1.00)
GFR calc Af Amer: 72 mL/min/{1.73_m2} (ref >59)
GFR calc non Af Amer: 62 mL/min/{1.73_m2} (ref >59)
GLUCOSE: 98 mg/dL (ref 65–99)
Globulin, Total: 3.5 g/dL (ref 1.5–4.5)
POTASSIUM: 3.7 mmol/L (ref 3.5–5.2)
SODIUM: 140 mmol/L (ref 134–144)
TOTAL PROTEIN: 8.1 g/dL (ref 6.0–8.5)

## 2017-05-25 ENCOUNTER — Encounter: Payer: Self-pay | Admitting: Internal Medicine

## 2017-05-25 DIAGNOSIS — M25559 Pain in unspecified hip: Secondary | ICD-10-CM

## 2017-05-27 ENCOUNTER — Encounter: Payer: Self-pay | Admitting: Internal Medicine

## 2017-05-27 DIAGNOSIS — M545 Low back pain: Secondary | ICD-10-CM | POA: Diagnosis not present

## 2017-05-27 DIAGNOSIS — M25551 Pain in right hip: Secondary | ICD-10-CM | POA: Diagnosis not present

## 2017-05-28 ENCOUNTER — Encounter: Payer: Self-pay | Admitting: Obstetrics & Gynecology

## 2017-05-28 ENCOUNTER — Ambulatory Visit (INDEPENDENT_AMBULATORY_CARE_PROVIDER_SITE_OTHER): Payer: 59 | Admitting: Obstetrics & Gynecology

## 2017-05-28 VITALS — BP 136/94 | HR 103 | Wt 208.0 lb

## 2017-05-28 DIAGNOSIS — Z01419 Encounter for gynecological examination (general) (routine) without abnormal findings: Secondary | ICD-10-CM

## 2017-05-28 DIAGNOSIS — D219 Benign neoplasm of connective and other soft tissue, unspecified: Secondary | ICD-10-CM

## 2017-05-28 DIAGNOSIS — Z1239 Encounter for other screening for malignant neoplasm of breast: Secondary | ICD-10-CM

## 2017-05-28 DIAGNOSIS — R87610 Atypical squamous cells of undetermined significance on cytologic smear of cervix (ASC-US): Secondary | ICD-10-CM | POA: Diagnosis not present

## 2017-05-28 NOTE — Patient Instructions (Signed)
Total Laparoscopic Hysterectomy, Care After Refer to this sheet in the next few weeks. These instructions provide you with information on caring for yourself after your procedure. Your health care provider may also give you more specific instructions. Your treatment has been planned according to current medical practices, but problems sometimes occur. Call your health care provider if you have any problems or questions after your procedure. What can I expect after the procedure?  Pain and bruising at the incision sites. You will be given pain medicine to control it.  Menopausal symptoms such as hot flashes, night sweats, and insomnia if your ovaries were removed.  Sore throat from the breathing tube that was inserted during surgery. Follow these instructions at home:  Only take over-the-counter or prescription medicines for pain, discomfort, or fever as directed by your health care provider.  Do not take aspirin. It can cause bleeding.  Do not drive when taking pain medicine.  Follow your health care provider's advice regarding diet, exercise, lifting, driving, and general activities.  Resume your usual diet as directed and allowed.  Get plenty of rest and sleep.  Do not douche, use tampons, or have sexual intercourse for at least 6 weeks, or until your health care provider gives you permission.  Change your bandages (dressings) as directed by your health care provider.  Monitor your temperature and notify your health care provider of a fever.  Take showers instead of baths for 2-3 weeks.  Do not drink alcohol until your health care provider gives you permission.  If you develop constipation, you may take a mild laxative with your health care provider's permission. Bran foods may help with constipation problems. Drinking enough fluids to keep your urine clear or pale yellow may help as well.  Try to have someone home with you for 1-2 weeks to help around the house.  Keep all of  your follow-up appointments as directed by your health care provider. Contact a health care provider if:  You have swelling, redness, or increasing pain around your incision sites.  You have pus coming from your incision.  You notice a bad smell coming from your incision.  Your incision breaks open.  You feel dizzy or lightheaded.  You have pain or bleeding when you urinate.  You have persistent diarrhea.  You have persistent nausea and vomiting.  You have abnormal vaginal discharge.  You have a rash.  You have any type of abnormal reaction or develop an allergy to your medicine.  You have poor pain control with your prescribed medicine. Get help right away if:  You have chest pain or shortness of breath.  You have severe abdominal pain that is not relieved with pain medicine.  You have pain or swelling in your legs. This information is not intended to replace advice given to you by your health care provider. Make sure you discuss any questions you have with your health care provider. Document Released: 02/09/2013 Document Revised: 09/27/2015 Document Reviewed: 11/09/2012 Elsevier Interactive Patient Education  2017 [place]. Hysterectomy Information A hysterectomy is a surgery to remove your uterus. After surgery, you will no longer have periods. Also, you will not be able to get pregnant. Reasons for this surgery  You have bleeding that is not normal and keeps coming back.  You have lasting (chronic) lower belly (pelvic) pain.  You have a lasting infection.  The lining of your uterus grows outside your uterus.  The lining of your uterus grows in the muscle of your uterus.  Your uterus falls down into your vagina.  You have a growth in your uterus that causes problems.  You have cells that could turn into cancer (precancerous cells).  You have cancer of the uterus or cervix. Types There are 3 types of hysterectomies. Depending on the type, the surgery  will:  Remove the top part of the uterus only.  Remove the uterus and the cervix.  Remove the uterus, cervix, and tissue that holds the uterus in place in the lower belly.  Ways a hysterectomy can be performed There are 5 ways this surgery can be performed.  A cut (incision) is made in the belly (abdomen). The uterus is taken out through the cut.  A cut is made in the vagina. The uterus is taken out through the cut.  Three or four cuts are made in the belly. A surgical device with a camera is put through one of the cuts. The uterus is cut into small pieces. The uterus is taken out through the cuts or the vagina.  Three or four cuts are made in the belly. A surgical device with a camera is put through one of the cuts. The uterus is taken out through the vagina.  Three or four cuts are made in the belly. A surgical device that is controlled by a computer makes a visual image. The device helps the surgeon control the surgical tools. The uterus is cut into small pieces. The pieces are taken out through the cuts or through the vagina.  What can I expect after the surgery?  You will be given pain medicine.  You will need help at home for 3-5 days after surgery.  You will need to see your doctor in 2-4 weeks after surgery.  You may get hot flashes, have night sweats, and have trouble sleeping.  You may need to have Pap tests in the future if your surgery was related to cancer. Talk to your doctor. It is still good to have regular exams. This information is not intended to replace advice given to you by your health care provider. Make sure you discuss any questions you have with your health care provider. Document Released: 07/14/2011 Document Revised: 09/27/2015 Document Reviewed: 12/27/2012 Elsevier Interactive Patient Education  [employer].

## 2017-05-28 NOTE — Progress Notes (Signed)
Last pap 2016 Last mammogram unknown

## 2017-05-28 NOTE — Progress Notes (Signed)
Subjective:     Kristina Gardner is a 47 y.o. female here for a routine exam.  G1P1. Current complaints: Pt c/o pain in her right hip. She reports that it has been occurring for 4 days, She missed work as a result of it,. She was unable to work prior to yesterday. She was seen by ortho yesterday, She had pain in the left hip prev. She reports a h/o fibroids. She is s/p a uterine fibroids embolization.  She was told by ortho that she has large fibroids and that this is the cause of her bleeding.      Pt denies irreg menses since she goe an LnIUD.   Has had a Liletta IUD placed 10/09/2015.     Gynecologic History No LMP recorded. Patient is not currently having periods (Reason: IUD). Contraception: IUD Last Pap: 08/04/2014. Results were: normal Last mammogram: 08/22/2014. Results were: pt had an abnormal mammogram prev which was normal on repeat.   Obstetric History G1P1  The following portions of the patient's history were reviewed and updated as appropriate: allergies, current medications, past family history, past medical history, past social history, past surgical history and problem list.  Review of Systems Pertinent items are noted in HPI.    Objective:  BP (!) 136/94   Pulse (!) 103   Wt 208 lb (94.3 kg)   BMI 32.58 kg/m    General Appearance:    Alert, cooperative, no distress, appears stated age  Head:    Normocephalic, without obvious abnormality, atraumatic  Eyes:    conjunctiva/corneas clear, EOM's intact, both eyes  Ears:    Normal external ear canals, both ears  Nose:   Nares normal, septum midline, mucosa normal, no drainage    or sinus tenderness  Throat:   Lips, mucosa, and tongue normal; teeth and gums normal  Neck:   Supple, symmetrical, trachea midline, no adenopathy;    thyroid:  no enlargement/tenderness/nodules  Back:     Symmetric, no curvature, ROM normal, no CVA tenderness  Lungs:     Clear to auscultation bilaterally, respirations unlabored  Chest Wall:     No tenderness or deformity   Heart:    Regular rate and rhythm, S1 and S2 normal, no murmur, rub   or gallop  Breast Exam:    No tenderness, masses, or nipple abnormality  Abdomen:     Soft, non-tender, bowel sounds active all four quadrants,    no masses, no organomegaly  Genitalia:    Normal female without lesion, discharge or tenderness; enlarged uterus- 16- weeks sized. On the left side the fibroids are within 1.5-2cm of the left hip. The right hip has no contact with the hip       Extremities:   Extremities normal, atraumatic, no cyanosis or edema  Pulses:   2+ and symmetric all extremities  Skin:   Skin color, texture, turgor normal, no rashes or lesions     Assessment:    Healthy female exam.   Screening for breast cancer Uterine fibroids- pt os concerned tha there hip pain is related to the fibroids. I have told her that there is no guarantee that that is the case. She does not have excessively large fibroids.  I cannot guarantee that they are not compressing a nerve but, if they are not, a hyst will not be beneficial .Pt expressed frustration over the pain and the potential relation to her fibroids and wants a hyst prior to her next menses (because she thinks that  the pain gets worse after her menses).  I have explained to her that that will not happen. This is not an indication for emergency surgery and is NOT guaranteed to improve her hip pain.     Plan:    Mammogram ordered. Follow up in: 2 week.    Pt wants to schedule a RATH. I have asked her to think about this decision and then call back once she decides. She was also asked to make a f/u appt with me for preop counseling. I will not be at the [facility] so she will need to see me at the [facility] or [facility] if she wants a RATH.  Need records from the Ortho with imaging.    F/u PAP   Parish Augustine L. Harraway-Smith, M.D., Cherlynn June

## 2017-06-01 ENCOUNTER — Encounter: Payer: Self-pay | Admitting: Radiology

## 2017-06-01 DIAGNOSIS — Z1231 Encounter for screening mammogram for malignant neoplasm of breast: Secondary | ICD-10-CM | POA: Diagnosis not present

## 2017-06-01 LAB — IGP, APTIMA HPV, RFX 16/18,45
HPV Aptima: NEGATIVE
PAP Smear Comment: 0

## 2017-06-02 ENCOUNTER — Telehealth: Payer: Self-pay | Admitting: *Deleted

## 2017-06-02 NOTE — Telephone Encounter (Signed)
Spoke to pt and went over results. She expressed understanding.

## 2017-06-03 ENCOUNTER — Encounter: Payer: Self-pay | Admitting: *Deleted

## 2017-06-22 ENCOUNTER — Ambulatory Visit: Payer: 59 | Admitting: Obstetrics & Gynecology

## 2017-07-30 ENCOUNTER — Encounter: Payer: Self-pay | Admitting: Internal Medicine

## 2017-10-22 ENCOUNTER — Other Ambulatory Visit: Payer: 59

## 2017-10-22 DIAGNOSIS — N898 Other specified noninflammatory disorders of vagina: Secondary | ICD-10-CM

## 2017-10-22 NOTE — Progress Notes (Signed)
SUBJECTIVE:  47 y.o. female complains of vag discharge for a couple of days. Denies abnormal vaginal bleeding or significant pelvic pain or fever. No UTI symptoms. Denies history of known exposure to STD.  No LMP recorded. (Menstrual status: IUD).  OBJECTIVE:  She appears well, afebrile. Urine dipstick: not done ASSESSMENT:  Vaginal Discharge small amount Vaginal Odor small amount   PLAN:  GC, chlamydia, trichomonas, BVAG, CVAG probe sent to lab. Treatment: To be determined once lab results are received ROV prn if symptoms persist or worsen.

## 2017-10-23 ENCOUNTER — Other Ambulatory Visit: Payer: Self-pay

## 2017-10-23 MED ORDER — FLUCONAZOLE 150 MG PO TABS: 150.0000 mg | ORAL_TABLET | Freq: Once | ORAL | 0 refills | Status: AC

## 2017-10-23 MED ORDER — METRONIDAZOLE 500 MG PO TABS: 500.0000 mg | ORAL_TABLET | Freq: Two times a day (BID) | ORAL | 0 refills | Status: DC

## 2017-10-23 NOTE — Telephone Encounter (Signed)
Patient called requesting results from wet prep swab that was completed on yesterday. Inform patient these results are not back yet and it will be over the weekend before they are resulted. Patient does not want to wait until then and is requesting a diflucan and flagyl be called in to help clear up symptoms. [facility]

## 2017-10-27 LAB — NUSWAB VAGINITIS (VG)
Candida albicans, NAA: NEGATIVE
Candida glabrata, NAA: NEGATIVE
TRICH VAG BY NAA: NEGATIVE

## 2017-11-26 ENCOUNTER — Other Ambulatory Visit: Payer: Self-pay

## 2017-11-26 MED ORDER — METRONIDAZOLE 500 MG PO TABS: 500.0000 mg | ORAL_TABLET | Freq: Two times a day (BID) | ORAL | 0 refills | Status: DC

## 2017-11-26 NOTE — Telephone Encounter (Signed)
Patient requested a refill on flagyl. Her symptoms have returned. Advised patient we can call in another refill for her but if symptoms do not change she will need to be seen by a provider. Patient voice understanding.

## 2018-01-06 ENCOUNTER — Ambulatory Visit: Payer: 59

## 2018-01-06 DIAGNOSIS — N898 Other specified noninflammatory disorders of vagina: Secondary | ICD-10-CM

## 2018-01-06 NOTE — Progress Notes (Signed)
SUBJECTIVE:  47 y.o. female complains of vaginal discharge for a couple of days. Denies abnormal vaginal bleeding or significant pelvic pain or fever. No UTI symptoms. Denies history of known exposure to STD.  No LMP recorded. (Menstrual status: IUD).  OBJECTIVE:  She appears well, afebrile. Urine dipstick: not done today  ASSESSMENT:  Vaginal Discharge small amount  Vaginal Odor small amount    PLAN:  GC, chlamydia, trichomonas, BVAG, CVAG probe sent to lab. Treatment: To be determined once lab results are received ROV prn if symptoms persist or worsen.

## 2018-01-08 LAB — NUSWAB VG+, CANDIDA 6SP
CANDIDA KRUSEI, NAA: NEGATIVE
CANDIDA LUSITANIAE, NAA: NEGATIVE
CANDIDA PARAPSILOSIS, NAA: NEGATIVE
Candida albicans, NAA: NEGATIVE
Candida glabrata, NAA: NEGATIVE
Candida tropicalis, NAA: NEGATIVE
Chlamydia trachomatis, NAA: NEGATIVE
Neisseria gonorrhoeae, NAA: NEGATIVE
TRICH VAG BY NAA: NEGATIVE

## 2018-04-26 ENCOUNTER — Other Ambulatory Visit: Payer: Self-pay | Admitting: Internal Medicine

## 2018-05-10 ENCOUNTER — Other Ambulatory Visit: Payer: Self-pay | Admitting: *Deleted

## 2018-05-10 MED ORDER — FLUCONAZOLE 150 MG PO TABS: 150.0000 mg | ORAL_TABLET | Freq: Once | ORAL | 0 refills | Status: AC

## 2018-05-10 NOTE — Telephone Encounter (Signed)
Pt left message requesting medication refill for a yeast infection. Rx sent to pharmacy.

## 2018-05-13 ENCOUNTER — Other Ambulatory Visit: Payer: Self-pay | Admitting: Internal Medicine

## 2018-05-17 ENCOUNTER — Ambulatory Visit (INDEPENDENT_AMBULATORY_CARE_PROVIDER_SITE_OTHER): Payer: 59 | Admitting: Internal Medicine

## 2018-05-17 ENCOUNTER — Encounter: Payer: Self-pay | Admitting: Internal Medicine

## 2018-05-17 ENCOUNTER — Other Ambulatory Visit: Payer: Self-pay

## 2018-05-17 VITALS — BP 110/80 | HR 85 | Temp 98.5°F | Ht 66.75 in | Wt 209.0 lb

## 2018-05-17 DIAGNOSIS — E039 Hypothyroidism, unspecified: Secondary | ICD-10-CM | POA: Diagnosis not present

## 2018-05-17 DIAGNOSIS — I10 Essential (primary) hypertension: Secondary | ICD-10-CM

## 2018-05-17 DIAGNOSIS — Z0000 Encounter for general adult medical examination without abnormal findings: Secondary | ICD-10-CM

## 2018-05-17 DIAGNOSIS — Z1211 Encounter for screening for malignant neoplasm of colon: Secondary | ICD-10-CM

## 2018-05-17 DIAGNOSIS — K219 Gastro-esophageal reflux disease without esophagitis: Secondary | ICD-10-CM | POA: Diagnosis not present

## 2018-05-17 NOTE — Assessment & Plan Note (Signed)
Discussed PPI every other day

## 2018-05-17 NOTE — Assessment & Plan Note (Signed)
Has skipped doses Will check labs
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
## 2018-05-17 NOTE — Assessment & Plan Note (Signed)
BP Readings from Last 3 Encounters:  05/17/18 110/80  05/28/17 (!) 136/94  05/15/17 114/88   Good control Due for labs

## 2018-05-18 LAB — COMPREHENSIVE METABOLIC PANEL
A/G RATIO: 1.5 (ref 1.2–2.2)
ALK PHOS: 140 IU/L — AB (ref 39–117)
ALT: 17 IU/L (ref 0–32)
AST: 13 IU/L (ref 0–40)
Albumin: 4.9 g/dL (ref 3.5–5.5)
BILIRUBIN TOTAL: 0.7 mg/dL (ref 0.0–1.2)
BUN/Creatinine Ratio: 12 (ref 9–23)
BUN: 13 mg/dL (ref 6–24)
CHLORIDE: 97 mmol/L (ref 96–106)
CO2: 25 mmol/L (ref 20–29)
Calcium: 10.2 mg/dL (ref 8.7–10.2)
Creatinine, Ser: 1.07 mg/dL — ABNORMAL HIGH (ref 0.57–1.00)
GFR calc Af Amer: 70 mL/min/{1.73_m2} (ref >59)
GFR calc non Af Amer: 61 mL/min/{1.73_m2} (ref >59)
Globulin, Total: 3.2 g/dL (ref 1.5–4.5)
Glucose: 93 mg/dL (ref 65–99)
POTASSIUM: 3.7 mmol/L (ref 3.5–5.2)
Sodium: 138 mmol/L (ref 134–144)
Total Protein: 8.1 g/dL (ref 6.0–8.5)

## 2018-05-18 LAB — TSH: TSH: 22.2 u[IU]/mL — AB (ref 0.450–4.500)

## 2018-05-18 LAB — CBC
Hematocrit: 48.1 % — ABNORMAL HIGH (ref 34.0–46.6)
Hemoglobin: 16 g/dL — ABNORMAL HIGH (ref 11.1–15.9)
MCH: 28.5 pg (ref 26.6–33.0)
MCHC: 33.3 g/dL (ref 31.5–35.7)
MCV: 86 fL (ref 79–97)
PLATELETS: 380 10*3/uL (ref 150–450)
RBC: 5.62 x10E6/uL — ABNORMAL HIGH (ref 3.77–5.28)
RDW: 13.5 % (ref 11.7–15.4)
WBC: 5.5 10*3/uL (ref 3.4–10.8)

## 2018-05-18 LAB — T4, FREE: FREE T4: 1.06 ng/dL (ref 0.82–1.77)

## 2018-05-31 ENCOUNTER — Telehealth: Payer: Self-pay | Admitting: Gastroenterology

## 2018-05-31 NOTE — Telephone Encounter (Signed)
Patient called to cancel colonoscopy for 06-01-2018 with Dr Soledad Gerlach is out of town and aware of the $100 cancellation FEE. Please call to r/s procedure.

## 2018-05-31 NOTE — Telephone Encounter (Signed)
Patients colonoscopy has been canceled with Vickki in Endoscopy for 06/01/18.  She is aware of cancellation fee.  Thanks [employer]

## 2018-06-01 ENCOUNTER — Ambulatory Visit: Admission: RE | Admit: 2018-06-01 | Payer: 59 | Source: Home / Self Care | Admitting: Gastroenterology

## 2018-06-01 ENCOUNTER — Encounter: Admission: RE | Payer: Self-pay | Source: Home / Self Care

## 2018-06-01 DIAGNOSIS — Z1211 Encounter for screening for malignant neoplasm of colon: Secondary | ICD-10-CM

## 2018-06-01 SURGERY — COLONOSCOPY WITH PROPOFOL
Anesthesia: General

## 2018-07-20 ENCOUNTER — Other Ambulatory Visit: Payer: Self-pay | Admitting: Internal Medicine

## 2018-07-28 ENCOUNTER — Encounter: Payer: Self-pay | Admitting: *Deleted

## 2018-07-28 ENCOUNTER — Telehealth (INDEPENDENT_AMBULATORY_CARE_PROVIDER_SITE_OTHER): Payer: 59 | Admitting: Family Medicine

## 2018-07-28 ENCOUNTER — Encounter: Payer: Self-pay | Admitting: Family Medicine

## 2018-07-28 ENCOUNTER — Other Ambulatory Visit: Payer: Self-pay

## 2018-07-28 DIAGNOSIS — R0989 Other specified symptoms and signs involving the circulatory and respiratory systems: Secondary | ICD-10-CM

## 2018-07-28 NOTE — Progress Notes (Signed)
     Matthews Franks T. Oran Dillenburg, MD Primary [facility] and [place] [facility] [place], [address] [phone]  FAX: [place] - 48 y.o. female  MRN [id number]  Date of Birth: 1970/09/15  Visit Date: 07/28/2018  PCP: Venia Carbon, MD  Referred by: Venia Carbon, MD  Virtual Visit via Telephone Note:  I connected with  Kristina Gardner on 07/28/2018  2:00 PM EDT by telephone and verified that I am speaking with the correct person using two identifiers.   Location patient: home phone or cell phone Location provider: work or home office Consent: Verbal consent directly obtained from Kristina Gardner and that there may be a patient responsible charge related to this service. Persons participating in the virtual visit: patient, provider  I discussed the limitations of evaluation and management by telemedicine and the availability of in person appointments.  The patient expressed understanding and agreed to proceed.     History of Present Illness:  Works at [employer], gets [facility] on her all day.  Feeling better now. Felt like better.   No other symptoms   Review of Systems: pertinent positives and pertinent negatives as per HPI No acute distress verbally  Past Medical History, Surgical History, Social History, Family History, Problem List, Medications, and Allergies have been reviewed and updated if relevant.   Observations/Objective/Exam:  An attempt was made to discern vital signs over the phone and per patient if applicable and possible.   Pulmonary:     Effort: Pulmonary effort is normal. No respiratory distress.  Neurological:     Mental Status: He is alert and oriented to person, place, and time.  Psychiatric:        Thought Content: Thought content normal.        Judgment: Judgment normal.   Runny nose  She is feeling better, minimal sx, supportive care, rest.  n/c  I discussed the  assessment and treatment plan with the patient. The patient was provided an opportunity to ask questions and all were answered. The patient agreed with the plan and demonstrated an understanding of the instructions.   The patient was advised to call back or seek an in-person evaluation if the symptoms worsen or if the condition fails to improve as anticipated.  I provided 2 minutes of non-face-to-face time during this encounter.  Follow-up: prn unless noted otherwise below No follow-ups on file.   Signed,  Maud Deed. Bernise Sylvain, MD

## 2018-08-02 ENCOUNTER — Other Ambulatory Visit: Payer: Self-pay | Admitting: Internal Medicine

## 2018-08-02 MED ORDER — GUAIFENESIN-CODEINE 100-10 MG/5ML PO SOLN: 5.0000 mL | Freq: Three times a day (TID) | ORAL | 0 refills | Status: DC | PRN

## 2018-08-02 NOTE — Telephone Encounter (Addendum)
Kristina Gardner called and said she went to pharmacy to pick up cough syrup and they did not have the prescriptions.  I faxed Rx this morning at 9:40 am.  I will have Dr. Lorelei Pont re-send electronically.

## 2018-08-02 NOTE — Telephone Encounter (Signed)
Best number [phone] Pt called stating she had a phone visit on 3/25 and she is not doing any better and wanted to know if you call something in  For cough.  [facility] [address] [place]

## 2018-08-02 NOTE — Telephone Encounter (Signed)
Robitussin Rx ordered as instructed by Dr. Lorelei Pont. Rx printed [facility] to [facility] [address] at [phone].   Spoke with Ms. Hook and and advised cough syrup Rx has been sent to her pharmacy.  She thought she could go back to work tomorrow.  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

## 2018-08-02 NOTE — Addendum Note (Signed)
Addended by: Carter Kitten on: 08/02/2018 03:44 PM   Modules accepted: Orders

## 2018-08-02 NOTE — Telephone Encounter (Signed)
Robitussin AC, 1-2 tsp po qhs prn cough, amount 180 mL, 0 ref  Happy to extend work note, but we need more information to do so. Can you help? Probably she can tell us the dates she has missed.

## 2018-08-02 NOTE — Telephone Encounter (Signed)
Pt called back needing a work note and wanted to know if this could be faxed  [phone]  Atten#  Gillermina Hu

## 2018-08-03 ENCOUNTER — Telehealth: Payer: Self-pay | Admitting: Internal Medicine

## 2018-08-03 NOTE — Telephone Encounter (Signed)
Pt did not return to work today due to congestion. She works in a cold lab and feels this would not be helpful to her condition. She is requesting a work note to be faxed to [phone], [facility] attn: Gillermina Hu Virtual visit 3/25 with Dr Lorelei Pont

## 2018-08-03 NOTE — Telephone Encounter (Signed)
Ms. Mackel notified as instructed by telephone.  Work note faxed to Gillermina Hu at [phone] as requested.

## 2018-08-03 NOTE — Telephone Encounter (Signed)
Can you please write her a work note for today with a return to work date of 08/04/2018.  If she is having symptoms that she feels limit her ability to return to work, then I recommend face to face evaluation with her primary care doctor, Dr. Silvio Pate.

## 2018-08-11 MED ORDER — AMOXICILLIN 500 MG PO TABS: 1000.0000 mg | ORAL_TABLET | Freq: Two times a day (BID) | ORAL | 0 refills | Status: AC

## 2018-10-06 ENCOUNTER — Other Ambulatory Visit: Payer: Self-pay | Admitting: Internal Medicine

## 2018-11-26 ENCOUNTER — Other Ambulatory Visit: Payer: Self-pay

## 2018-11-26 ENCOUNTER — Ambulatory Visit (INDEPENDENT_AMBULATORY_CARE_PROVIDER_SITE_OTHER): Payer: 59 | Admitting: Internal Medicine

## 2018-11-26 ENCOUNTER — Encounter: Payer: Self-pay | Admitting: Internal Medicine

## 2018-11-26 DIAGNOSIS — R05 Cough: Secondary | ICD-10-CM

## 2018-11-26 DIAGNOSIS — R053 Chronic cough: Secondary | ICD-10-CM | POA: Insufficient documentation

## 2018-11-26 MED ORDER — BENZONATATE 200 MG PO CAPS: 200.0000 mg | ORAL_CAPSULE | Freq: Three times a day (TID) | ORAL | 0 refills | Status: DC | PRN

## 2018-11-26 MED ORDER — AZITHROMYCIN 250 MG PO TABS: ORAL_TABLET | ORAL | 0 refills | Status: DC

## 2018-11-26 NOTE — Assessment & Plan Note (Signed)
Has had a low level cough for 4 months Nothing concerning for COVID She relates this to the cold of her work environment---asks for letter Will try empiric antibiotic in case low level sinus or atypical infection Benzonatate for cough

## 2018-11-26 NOTE — Progress Notes (Signed)
Subjective:    Patient ID: Kristina Gardner, female    DOB: Mar 29, 1971, 48 y.o.   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 Air conditioning on all the time--and she is very cold Wears extra clothes but she still feels like a cold Some congestion---slight cough and had slight yellow mucus back in March when this started Ongoing for months --dragging along Seemed better but now recurring with the air on higher in the past week Feels tight sense in her chest now  Has new feelings of anxiety---when trying to go to sleep Wants to go to sleep--but mind is still going Boyfriend notes her sleeping but her fitbit says she is restless Feet feel puffy when arising Awakens refreshed for the most part  Current Outpatient Medications on File Prior to Visit  Medication Sig Dispense Refill  . DILT-XR 180 MG 24 hr capsule TAKE 1 CAPSULE BY MOUTH  DAILY 90 capsule 3  . guaiFENesin-codeine 100-10 MG/5ML syrup Take 5-10 mLs by mouth 3 (three) times daily as needed for cough. 180 mL 0  . levothyroxine (SYNTHROID, LEVOTHROID) 100 MCG tablet TAKE 1 TABLET BY MOUTH  DAILY 90 tablet 3  . losartan-hydrochlorothiazide (HYZAAR) 100-25 MG tablet TAKE 1 TABLET BY MOUTH EVERY DAY 90 tablet 2  . omeprazole (PRILOSEC) 20 MG capsule Take 20 mg by mouth every other day.     No current facility-administered medications on file prior to visit.     Allergies  Allergen Reactions  . Norvasc [Amlodipine Besylate] Other (See Comments)    dizziness    Past Medical History:  Diagnosis Date  . GERD (gastroesophageal reflux disease)   . Hypertension   . Leiomyoma of uterus, unspecified   . Obesity   . Pneumonia   . Unspecified hypothyroidism     Past Surgical History:  Procedure Laterality Date  . DILATION AND  CURETTAGE OF UTERUS  2000    Family History  Problem Relation Age of Onset  . Hypertension Mother   . Cancer Mother        Bone Marrow   . Kidney disease Mother   . Arthritis Sister   . Cancer Paternal Grandfather        lung cancer  . Cancer Maternal Aunt        from snuff  . Dementia Maternal Aunt   . Bladder Cancer Maternal Aunt   . Graves' disease Other        multiple    Social History   Socioeconomic History  . Marital status: Single    Spouse name: Not on file  . Number of children: 1  . Years of education: Not on file  . Highest education level: Not on file  Occupational History  . Occupation: [employer], Therapist, art  Social Needs  . Financial resource strain: Not on file  . Food insecurity    Worry: Not on file    Inability: Not on file  . Transportation needs    Medical: Not on file    Non-medical: Not on file  Tobacco Use  . Smoking status: Never Smoker  . Smokeless tobacco: Never Used  Substance and Sexual Activity  . Alcohol use: Yes    Comment: occasional  . Drug use: No  . Sexual activity: Yes    Partners: Male  Birth control/protection: Condom, I.U.D.  Lifestyle  . Physical activity    Days per week: Not on file    Minutes per session: Not on file  . Stress: Not on file  Relationships  . Social Herbalist on phone: Not on file    Gets together: Not on file    Attends religious service: Not on file    Active member of club or organization: Not on file    Attends meetings of clubs or organizations: Not on file    Relationship status: Not on file  . Intimate partner violence    Fear of current or ex partner: Not on file    Emotionally abused: Not on file    Physically abused: Not on file    Forced sexual activity: Not on file  Other Topics Concern  . Not on file  Social History Narrative   1 daughter   Live in boyfriend since ~2008. He has 2 daughters   Review of Systems Gets temperature checked daily at work---no  fever No SOB No shaking chills or sweats (other than menopause symptoms) No change in taste or smell Appetite is fine No history of asthma    Objective:   Physical Exam  Constitutional: She appears well-developed. No distress.  Respiratory: Effort normal. No respiratory distress.  No cough during the visit  Psychiatric: She has a normal mood and affect. Her behavior is normal.           Assessment & Plan:

## 2018-12-06 ENCOUNTER — Telehealth: Payer: Self-pay | Admitting: Internal Medicine

## 2018-12-06 DIAGNOSIS — Z0279 Encounter for issue of other medical certificate: Secondary | ICD-10-CM

## 2018-12-06 NOTE — Telephone Encounter (Signed)
Pt drop off ADA paperwork She stated she wanted it to start 7/24.   She stated it was for  Air blowing directly on her and she keeps stuffy nose and freezing all the time  In dr Everardo Beals in box

## 2018-12-06 NOTE — Telephone Encounter (Signed)
What is she asking for? Just that her desk be moved? This is a complicated form--though if that is all it is, I can probably do it without a visit

## 2018-12-07 NOTE — Telephone Encounter (Signed)
Spoke with pt she stated she wanted her desk moved away from air vent

## 2018-12-07 NOTE — Telephone Encounter (Signed)
Before you send this, confirm she didn't miss work (the form states that it is for people who miss some work) and that I can leave all the stuff blank that I did

## 2018-12-08 NOTE — Telephone Encounter (Signed)
Spoke with pt she has not missed any work and she was ok with leaving parts of paperwork blank  Paperwork faxed Pt aware

## 2018-12-08 NOTE — Telephone Encounter (Signed)
Copy for scan Copy for pt Copy for billing 

## 2019-01-07 ENCOUNTER — Other Ambulatory Visit: Payer: Self-pay

## 2019-01-07 ENCOUNTER — Other Ambulatory Visit (INDEPENDENT_AMBULATORY_CARE_PROVIDER_SITE_OTHER): Payer: 59

## 2019-01-07 VITALS — BP 139/92 | HR 72

## 2019-01-07 DIAGNOSIS — N898 Other specified noninflammatory disorders of vagina: Secondary | ICD-10-CM

## 2019-01-07 MED ORDER — METRONIDAZOLE 500 MG PO TABS: 500.0000 mg | ORAL_TABLET | Freq: Two times a day (BID) | ORAL | 0 refills | Status: DC

## 2019-01-07 NOTE — Progress Notes (Signed)
SUBJECTIVE:  48 y.o. female complains of vaginal discharge  For a couple of days. She feels like its BV and request we call her in some medications for BV.Marland Kitchen Denies abnormal vaginal bleeding or significant pelvic pain or fever. No UTI symptoms. Denies history of known exposure to STD.  No LMP recorded. (Menstrual status: IUD).  OBJECTIVE:  She appears well, afebrile. Urine dipstick: none   ASSESSMENT:  Vaginal Discharge small amount  Vaginal Odor yes   PLAN:    BVAG, CVAG probe sent to lab. Treatment: To be determined once lab results are received ROV prn if symptoms persist or worsen.

## 2019-01-07 NOTE — Addendum Note (Signed)
Addended by: Phillip Heal, Amerah Puleo A on: 01/07/2019 08:22 AM   Modules accepted: Orders

## 2019-01-11 LAB — NUSWAB VG+, CANDIDA 6SP
C PARAPSILOSIS/TROPICALIS: NEGATIVE
Candida albicans, NAA: NEGATIVE
Candida glabrata, NAA: NEGATIVE
Candida krusei, NAA: NEGATIVE
Candida lusitaniae, NAA: NEGATIVE
Chlamydia trachomatis, NAA: NEGATIVE
Neisseria gonorrhoeae, NAA: NEGATIVE
Trich vag by NAA: NEGATIVE

## 2019-04-14 ENCOUNTER — Other Ambulatory Visit: Payer: Self-pay | Admitting: Internal Medicine

## 2019-04-22 ENCOUNTER — Telehealth: Payer: 59

## 2019-05-24 ENCOUNTER — Encounter: Payer: Self-pay | Admitting: Internal Medicine

## 2019-05-24 ENCOUNTER — Ambulatory Visit (INDEPENDENT_AMBULATORY_CARE_PROVIDER_SITE_OTHER): Payer: 59 | Admitting: Internal Medicine

## 2019-05-24 ENCOUNTER — Other Ambulatory Visit: Payer: Self-pay

## 2019-05-24 VITALS — BP 136/100 | HR 91 | Temp 97.9°F | Ht 66.5 in | Wt 219.8 lb

## 2019-05-24 DIAGNOSIS — Z0000 Encounter for general adult medical examination without abnormal findings: Secondary | ICD-10-CM | POA: Diagnosis not present

## 2019-05-24 DIAGNOSIS — I10 Essential (primary) hypertension: Secondary | ICD-10-CM

## 2019-05-24 DIAGNOSIS — K219 Gastro-esophageal reflux disease without esophagitis: Secondary | ICD-10-CM | POA: Diagnosis not present

## 2019-05-24 DIAGNOSIS — E039 Hypothyroidism, unspecified: Secondary | ICD-10-CM

## 2019-05-24 MED ORDER — OMEPRAZOLE 20 MG PO CPDR: 20.0000 mg | DELAYED_RELEASE_CAPSULE | Freq: Every day | ORAL | 11 refills | Status: DC

## 2019-05-24 NOTE — Assessment & Plan Note (Signed)
Due for mammogram Missed colon last year---asked Dr Vicente Males to see if he can get her back in Discussed exercise Prefers no vaccine--flu or COVID Pap not due yet

## 2019-05-24 NOTE — Patient Instructions (Signed)
Please set up your screening mammogram. 

## 2019-05-24 NOTE — Assessment & Plan Note (Signed)
BP Readings from Last 3 Encounters:  05/24/19 (!) 136/100  01/07/19 (!) 139/92  05/17/18 110/80   Asked her to check at home No change for now Will check labs

## 2019-05-24 NOTE — Progress Notes (Signed)
Subjective:    Patient ID: Kristina Gardner, female    DOB: [birth year], 49 y.o.   MRN: [id number]  HPI  Here for physical  This visit occurred during the SARS-CoV-2 public health emergency.  Safety protocols were in place, including screening questions prior to the visit, additional usage of staff PPE, and extensive cleaning of exam room while observing appropriate contact time as indicated for disinfecting solutions.   Having more trouble with acid reflux Has to take the PPI every day--but still can have some symptoms after lunch (a tums will help) No dysphagia  Still with aches and pains Has "crook in neck" Ongoing stress at home (boyfriend's daughters left--one in jail) Ongoing trouble with sister  Current Outpatient Medications on File Prior to Visit  Medication Sig Dispense Refill  . DILT-XR 180 MG 24 hr capsule TAKE 1 CAPSULE BY MOUTH  DAILY 90 capsule 3  . Levonorgestrel (SKYLA) 13.5 MG IUD by Intrauterine route.    Marland Kitchen levothyroxine (SYNTHROID, LEVOTHROID) 100 MCG tablet TAKE 1 TABLET BY MOUTH  DAILY 90 tablet 3  . losartan-hydrochlorothiazide (HYZAAR) 100-25 MG tablet TAKE 1 TABLET BY MOUTH EVERY DAY 90 tablet 2   No current facility-administered medications on file prior to visit.    Allergies  Allergen Reactions  . Norvasc [Amlodipine Besylate] Other (See Comments)    dizziness    Past Medical History:  Diagnosis Date  . GERD (gastroesophageal reflux disease)   . Hypertension   . Leiomyoma of uterus, unspecified   . Obesity   . Pneumonia   . Unspecified hypothyroidism     Past Surgical History:  Procedure Laterality Date  . DILATION AND CURETTAGE OF UTERUS  2000    Family History  Problem Relation Age of Onset  . Hypertension Mother   . Cancer Mother        Bone Marrow   . Kidney disease Mother   . Arthritis Sister   . Cancer Paternal Grandfather        lung cancer  . Cancer Maternal Aunt        from snuff  . Dementia Maternal Aunt   .  Bladder Cancer Maternal Aunt   . Graves' disease Other        multiple    Social History   Socioeconomic History  . Marital status: Single    Spouse name: Not on file  . Number of children: 1  . Years of education: Not on file  . Highest education level: Not on file  Occupational History  . Occupation: [employer], Therapist, art  Tobacco Use  . Smoking status: Never Smoker  . Smokeless tobacco: Never Used  Substance and Sexual Activity  . Alcohol use: Yes    Comment: occasional  . Drug use: No  . Sexual activity: Yes    Partners: Male    Birth control/protection: Condom, I.U.D.  Other Topics Concern  . Not on file  Social History Narrative   1 daughter   Live in boyfriend since ~2008. He has 2 daughters   Social Determinants of Radio broadcast assistant Strain:   . Difficulty of Paying Living Expenses: Not on file  Food Insecurity:   . Worried About Charity fundraiser in the Last Year: Not on file  . Ran Out of Food in the Last Year: Not on file  Transportation Needs:   . Lack of Transportation (Medical): Not on file  . Lack of Transportation (Non-Medical): Not on file  Physical Activity:   .  Days of Exercise per Week: Not on file  . Minutes of Exercise per Session: Not on file  Stress:   . Feeling of Stress : Not on file  Social Connections:   . Frequency of Communication with Friends and Family: Not on file  . Frequency of Social Gatherings with Friends and Family: Not on file  . Attends Religious Services: Not on file  . Active Member of Clubs or Organizations: Not on file  . Attends Archivist Meetings: Not on file  . Marital Status: Not on file  Intimate Partner Violence:   . Fear of Current or Ex-Partner: Not on file  . Emotionally Abused: Not on file  . Physically Abused: Not on file  . Sexually Abused: Not on file   Review of Systems  Constitutional: Negative for fatigue.       Wears seat belt No exercise Weight is up some  HENT:  Negative for hearing loss, tinnitus and trouble swallowing.        Recent cavity--some increased sensitivity now  Eyes: Negative for visual disturbance.       No diplopia or unilateral vision  Respiratory: Negative for cough, chest tightness and shortness of breath.   Cardiovascular: Negative for chest pain and palpitations.       Some fluid around ankles--mostly at night  Gastrointestinal: Negative for abdominal pain, blood in stool and constipation.  Endocrine: Negative for polydipsia and polyuria.  Genitourinary: Negative for dyspareunia and dysuria.       Hasn't seen the gyn lately No periods but some hormonal symptoms  Musculoskeletal: Positive for arthralgias. Negative for back pain and joint swelling.  Skin: Negative for rash.  Allergic/Immunologic: Negative for environmental allergies and immunocompromised state.  Neurological: Positive for headaches. Negative for dizziness, syncope and light-headedness.  Hematological: Negative for adenopathy. Does not bruise/bleed easily.  Psychiatric/Behavioral: Negative for dysphoric mood and sleep disturbance. The patient is not nervous/anxious.        Objective:   Physical Exam  Constitutional: She is oriented to person, place, and time. She appears well-developed. No distress.  HENT:  Head: Normocephalic and atraumatic.  Right Ear: External ear normal.  Left Ear: External ear normal.  Mouth/Throat: Oropharynx is clear and moist. No oropharyngeal exudate.  Eyes: Pupils are equal, round, and reactive to light. Conjunctivae are normal.  Neck: No thyromegaly present.  Cardiovascular: Normal rate, regular rhythm, normal heart sounds and intact distal pulses. Exam reveals no gallop.  No murmur heard. Respiratory: Effort normal and breath sounds normal. No respiratory distress. She has no wheezes. She has no rales.  GI: Soft. There is no abdominal tenderness.  Musculoskeletal:        General: No tenderness or edema.  Lymphadenopathy:     She has no cervical adenopathy.  Neurological: She is alert and oriented to person, place, and time.  Skin: No rash noted. No erythema.  Psychiatric: She has a normal mood and affect. Her behavior is normal.           Assessment & Plan:

## 2019-05-24 NOTE — Assessment & Plan Note (Signed)
Regular with meds Will recheck labs

## 2019-05-24 NOTE — Assessment & Plan Note (Signed)
Some breakthrough symptoms Will try bid at times--wean as tolerated if symptoms controlled

## 2019-05-25 LAB — COMPREHENSIVE METABOLIC PANEL
ALT: 15 IU/L (ref 0–32)
AST: 10 IU/L (ref 0–40)
Albumin/Globulin Ratio: 1.4 (ref 1.2–2.2)
Albumin: 4.6 g/dL (ref 3.8–4.8)
Alkaline Phosphatase: 163 IU/L — ABNORMAL HIGH (ref 39–117)
BUN/Creatinine Ratio: 16 (ref 9–23)
BUN: 16 mg/dL (ref 6–24)
Bilirubin Total: 0.5 mg/dL (ref 0.0–1.2)
CO2: 25 mmol/L (ref 20–29)
Calcium: 10.2 mg/dL (ref 8.7–10.2)
Chloride: 99 mmol/L (ref 96–106)
Creatinine, Ser: 1.01 mg/dL — ABNORMAL HIGH (ref 0.57–1.00)
GFR calc Af Amer: 75 mL/min/{1.73_m2} (ref >59)
GFR calc non Af Amer: 65 mL/min/{1.73_m2} (ref >59)
Globulin, Total: 3.3 g/dL (ref 1.5–4.5)
Glucose: 100 mg/dL — ABNORMAL HIGH (ref 65–99)
Potassium: 3.8 mmol/L (ref 3.5–5.2)
Sodium: 138 mmol/L (ref 134–144)
Total Protein: 7.9 g/dL (ref 6.0–8.5)

## 2019-05-25 LAB — LIPID PANEL
Chol/HDL Ratio: 4.6 ratio — ABNORMAL HIGH (ref 0.0–4.4)
Cholesterol, Total: 240 mg/dL — ABNORMAL HIGH (ref 100–199)
HDL: 52 mg/dL (ref >39)
LDL Chol Calc (NIH): 171 mg/dL — ABNORMAL HIGH (ref 0–99)
Triglycerides: 96 mg/dL (ref 0–149)
VLDL Cholesterol Cal: 17 mg/dL (ref 5–40)

## 2019-05-25 LAB — CBC
Hematocrit: 49.5 % — ABNORMAL HIGH (ref 34.0–46.6)
Hemoglobin: 16.3 g/dL — ABNORMAL HIGH (ref 11.1–15.9)
MCH: 28.8 pg (ref 26.6–33.0)
MCHC: 32.9 g/dL (ref 31.5–35.7)
MCV: 88 fL (ref 79–97)
Platelets: 345 10*3/uL (ref 150–450)
RBC: 5.66 x10E6/uL — ABNORMAL HIGH (ref 3.77–5.28)
RDW: 13.2 % (ref 11.7–15.4)
WBC: 4.4 10*3/uL (ref 3.4–10.8)

## 2019-05-25 LAB — TSH: TSH: 3.61 u[IU]/mL (ref 0.450–4.500)

## 2019-05-25 LAB — T4, FREE: Free T4: 1.43 ng/dL (ref 0.82–1.77)

## 2019-06-17 ENCOUNTER — Other Ambulatory Visit: Payer: Self-pay | Admitting: Internal Medicine

## 2019-07-08 ENCOUNTER — Other Ambulatory Visit: Payer: Self-pay

## 2019-07-08 MED ORDER — OMEPRAZOLE 20 MG PO CPDR: 20.0000 mg | DELAYED_RELEASE_CAPSULE | Freq: Every day | ORAL | 3 refills | Status: DC

## 2019-07-08 NOTE — Telephone Encounter (Signed)
Rx sent electronically.  

## 2019-07-20 ENCOUNTER — Telehealth: Payer: Self-pay | Admitting: Internal Medicine

## 2019-07-20 ENCOUNTER — Other Ambulatory Visit: Payer: Self-pay

## 2019-07-20 ENCOUNTER — Other Ambulatory Visit: Payer: Self-pay | Admitting: Internal Medicine

## 2019-07-20 DIAGNOSIS — Z1211 Encounter for screening for malignant neoplasm of colon: Secondary | ICD-10-CM

## 2019-07-20 NOTE — Telephone Encounter (Signed)
Original Referral for Colonoscopy ran out and they are requesting a New  Referral to [facility] for Screening Colonoscopy. Please place New Referral to [facility] and I will send it internally to their workque.

## 2019-07-20 NOTE — Telephone Encounter (Signed)
I already did The person from [facility] sent me a message

## 2019-08-11 ENCOUNTER — Other Ambulatory Visit
Admission: RE | Admit: 2019-08-11 | Discharge: 2019-08-11 | Disposition: A | Discharge status: Home / Self Care | Payer: 59 | Source: Ambulatory Visit | Attending: Gastroenterology | Admitting: Gastroenterology

## 2019-08-11 ENCOUNTER — Other Ambulatory Visit: Payer: Self-pay | Admitting: Internal Medicine

## 2019-08-11 ENCOUNTER — Other Ambulatory Visit: Payer: Self-pay

## 2019-08-11 DIAGNOSIS — Z20822 Contact with and (suspected) exposure to covid-19: Secondary | ICD-10-CM | POA: Insufficient documentation

## 2019-08-11 DIAGNOSIS — Z01812 Encounter for preprocedural laboratory examination: Secondary | ICD-10-CM | POA: Diagnosis present

## 2019-08-11 LAB — SARS CORONAVIRUS 2 (TAT 6-24 HRS): SARS Coronavirus 2: NEGATIVE

## 2019-08-15 ENCOUNTER — Ambulatory Visit
Admission: RE | Admit: 2019-08-15 | Discharge: 2019-08-15 | Disposition: A | Discharge status: Home / Self Care | Payer: 59 | Attending: Gastroenterology | Admitting: Gastroenterology

## 2019-08-15 ENCOUNTER — Encounter
Admission: RE | Disposition: A | Discharge status: Home / Self Care | Payer: Self-pay | Source: Home / Self Care | Attending: Gastroenterology

## 2019-08-15 ENCOUNTER — Ambulatory Visit: Payer: 59 | Admitting: Registered Nurse

## 2019-08-15 ENCOUNTER — Other Ambulatory Visit: Payer: Self-pay

## 2019-08-15 ENCOUNTER — Encounter: Payer: Self-pay | Admitting: Gastroenterology

## 2019-08-15 DIAGNOSIS — I10 Essential (primary) hypertension: Secondary | ICD-10-CM | POA: Diagnosis not present

## 2019-08-15 DIAGNOSIS — E039 Hypothyroidism, unspecified: Secondary | ICD-10-CM | POA: Diagnosis not present

## 2019-08-15 DIAGNOSIS — K219 Gastro-esophageal reflux disease without esophagitis: Secondary | ICD-10-CM | POA: Diagnosis not present

## 2019-08-15 DIAGNOSIS — E669 Obesity, unspecified: Secondary | ICD-10-CM | POA: Insufficient documentation

## 2019-08-15 DIAGNOSIS — Z888 Allergy status to other drugs, medicaments and biological substances status: Secondary | ICD-10-CM | POA: Insufficient documentation

## 2019-08-15 DIAGNOSIS — Z8349 Family history of other endocrine, nutritional and metabolic diseases: Secondary | ICD-10-CM | POA: Diagnosis not present

## 2019-08-15 DIAGNOSIS — Z8249 Family history of ischemic heart disease and other diseases of the circulatory system: Secondary | ICD-10-CM | POA: Diagnosis not present

## 2019-08-15 DIAGNOSIS — Z6834 Body mass index (BMI) 34.0-34.9, adult: Secondary | ICD-10-CM | POA: Insufficient documentation

## 2019-08-15 DIAGNOSIS — Z79899 Other long term (current) drug therapy: Secondary | ICD-10-CM | POA: Insufficient documentation

## 2019-08-15 DIAGNOSIS — Z79890 Hormone replacement therapy (postmenopausal): Secondary | ICD-10-CM | POA: Insufficient documentation

## 2019-08-15 DIAGNOSIS — Z1211 Encounter for screening for malignant neoplasm of colon: Secondary | ICD-10-CM

## 2019-08-15 DIAGNOSIS — Z975 Presence of (intrauterine) contraceptive device: Secondary | ICD-10-CM | POA: Insufficient documentation

## 2019-08-15 DIAGNOSIS — Z793 Long term (current) use of hormonal contraceptives: Secondary | ICD-10-CM | POA: Insufficient documentation

## 2019-08-15 HISTORY — PX: COLONOSCOPY WITH PROPOFOL: SHX5780

## 2019-08-15 LAB — POCT PREGNANCY, URINE: Preg Test, Ur: NEGATIVE

## 2019-08-15 SURGERY — COLONOSCOPY WITH PROPOFOL
Anesthesia: General

## 2019-08-15 MED ORDER — LABETALOL HCL 5 MG/ML IV SOLN
INTRAVENOUS | Status: AC
  Filled 2019-08-15: qty 4

## 2019-08-15 MED ORDER — PROPOFOL 10 MG/ML IV BOLUS
INTRAVENOUS | Status: DC | PRN
  Administered 2019-08-15: 100 mg via INTRAVENOUS

## 2019-08-15 MED ORDER — SODIUM CHLORIDE 0.9 % IV SOLN: INTRAVENOUS | Status: DC

## 2019-08-15 MED ORDER — PROPOFOL 500 MG/50ML IV EMUL
INTRAVENOUS | Status: DC | PRN
  Administered 2019-08-15: 150 ug/kg/min via INTRAVENOUS

## 2019-08-15 MED ORDER — DEXMEDETOMIDINE HCL 200 MCG/2ML IV SOLN
INTRAVENOUS | Status: DC | PRN
  Administered 2019-08-15: 12 ug via INTRAVENOUS

## 2019-08-15 MED ORDER — LIDOCAINE HCL (CARDIAC) PF 100 MG/5ML IV SOSY
PREFILLED_SYRINGE | INTRAVENOUS | Status: DC | PRN
  Administered 2019-08-15: 100 mg via INTRAVENOUS

## 2019-08-15 MED ORDER — LABETALOL HCL 5 MG/ML IV SOLN
5.0000 mg | INTRAVENOUS | Status: DC | PRN
  Administered 2019-08-15: 5 mg via INTRAVENOUS
  Filled 2019-08-15 (×2): qty 4

## 2019-08-15 NOTE — Transfer of Care (Signed)
Immediate Anesthesia Transfer of Care Note  Patient: Kristina Gardner  Procedure(s) Performed: COLONOSCOPY WITH PROPOFOL (N/A )  Patient Location: Endoscopy Unit  Anesthesia Type:General  Level of Consciousness: drowsy  Airway & Oxygen Therapy: Patient Spontanous Breathing and Patient connected to nasal cannula oxygen  Post-op Assessment: Report given to RN and Post -op Vital signs reviewed and stable  Post vital signs: Reviewed and stable  Last Vitals:  Vitals Value Taken Time  BP 126/79 08/15/19 0934  Temp    Pulse 99 08/15/19 0935  Resp 16 08/15/19 0935  SpO2 97 % 08/15/19 0935  Vitals shown include unvalidated device data.  Last Pain:  Vitals:   08/15/19 0930  TempSrc: Temporal         Complications: No apparent anesthesia complications

## 2019-08-15 NOTE — Anesthesia Postprocedure Evaluation (Signed)
Anesthesia Post Note  Patient: Kristina Gardner  Procedure(s) Performed: COLONOSCOPY WITH PROPOFOL (N/A )  Patient location during evaluation: PACU Anesthesia Type: General Level of consciousness: awake and alert Pain management: pain level controlled Vital Signs Assessment: post-procedure vital signs reviewed and stable Respiratory status: spontaneous breathing, nonlabored ventilation, respiratory function stable and patient connected to nasal cannula oxygen Cardiovascular status: blood pressure returned to baseline and stable Postop Assessment: no apparent nausea or vomiting Anesthetic complications: no     Last Vitals:  Vitals:   08/15/19 0930 08/15/19 0940  BP: 126/79 118/88  Pulse: 97 88  Resp: 17 15  Temp: (!) 35.8 C   SpO2: 97% 97%    Last Pain:  Vitals:   08/15/19 0930  TempSrc: Temporal                 Molli Barrows

## 2019-08-15 NOTE — OR Nursing (Signed)
BP/132/106, DR ADAMS ORDERED NORMODYNE AND BP/ CAME DOWN.

## 2019-08-15 NOTE — Op Note (Signed)
[facility] Gastroenterology Patient Name: Kristina Gardner Procedure Date: 08/15/2019 9:16 AM MRN: [id number] Account #: [id number] Date of Birth: Jul 09, 1970 Admit Type: Outpatient Age: 49 Room: [facility] ENDO ROOM 4 Gender: Female Note Status: Finalized Procedure:             Colonoscopy Indications:           Screening for colorectal malignant neoplasm Providers:             Jonathon Bellows MD, MD Medicines:             Monitored Anesthesia Care Complications:         No immediate complications. Procedure:             Pre-Anesthesia Assessment:                        - Prior to the procedure, a History and Physical was                         performed, and patient medications, allergies and                         sensitivities were reviewed. The patient's tolerance                         of previous anesthesia was reviewed.                        - The risks and benefits of the procedure and the                         sedation options and risks were discussed with the                         patient. All questions were answered and informed                         consent was obtained.                        - ASA Grade Assessment: II - A patient with mild                         systemic disease.                        After obtaining informed consent, the colonoscope was                         passed under direct vision. Throughout the procedure,                         the patient's blood pressure, pulse, and oxygen                         saturations were monitored continuously. The                         Colonoscope was introduced through the anus with the  intention of advancing to the cecum. The scope was                         advanced to the sigmoid colon before the procedure was                         aborted. Medications were given. The colonoscopy was                         performed with ease. The patient tolerated the                     procedure well. The quality of the bowel preparation                         was poor. Findings:      The perianal and digital rectal examinations were normal.      A large amount of stool was found in the rectum and in the sigmoid       colon, interfering with visualization. Impression:            - Preparation of the colon was poor.                        - Stool in the rectum and in the sigmoid colon.                        - No specimens collected. Recommendation:        - Discharge patient to home (with escort).                        - Resume previous diet.                        - Continue present medications.                        - Repeat colonoscopy in 4 weeks because the bowel                         preparation was suboptimal. Procedure Code(s):     --- Professional ---                        [phone], 53, Colonoscopy, flexible; diagnostic,                         including collection of specimen(s) by brushing or                         washing, when performed (separate procedure) Diagnosis Code(s):     --- Professional ---                        Z12.11, Encounter for screening for malignant neoplasm                         of colon CPT copyright 2019 American Medical Association. All rights reserved. The codes documented in this report are preliminary and upon coder review may  be revised to meet current compliance requirements.  Jonathon Bellows, MD Jonathon Bellows MD, MD 08/15/2019 9:28:11 AM This report has been signed electronically. Number of Addenda: 0 Note Initiated On: 08/15/2019 9:16 AM Total Procedure Duration: 0 hours 0 minutes 36 seconds  Estimated Blood Loss:  Estimated blood loss: none.      [facility]

## 2019-08-15 NOTE — H&P (Signed)
Jonathon Bellows, MD [address] [place], [place], [place], [address] [address], [place], [place], [place], [address] Phone: [phone]  Fax: [phone]  Primary Care Physician:  Venia Carbon, MD   Pre-Procedure History & Physical: HPI:  Kristina Gardner is a 49 y.o. female is here for an colonoscopy.   Past Medical History:  Diagnosis Date  . GERD (gastroesophageal reflux disease)   . Hypertension   . Leiomyoma of uterus, unspecified   . Obesity   . Pneumonia   . Unspecified hypothyroidism     Past Surgical History:  Procedure Laterality Date  . DILATION AND CURETTAGE OF UTERUS  2000    Prior to Admission medications   Medication Sig Start Date End Date Taking? Authorizing Provider  DILT-XR 180 MG 24 hr capsule TAKE 1 CAPSULE BY MOUTH  DAILY 04/15/19  Yes Viviana Simpler I, MD  levothyroxine (SYNTHROID) 100 MCG tablet TAKE 1 TABLET BY MOUTH  DAILY 06/18/19  Yes Venia Carbon, MD  losartan-hydrochlorothiazide (HYZAAR) 100-25 MG tablet TAKE 1 TABLET BY MOUTH EVERY DAY 08/11/19  Yes Venia Carbon, MD  omeprazole (PRILOSEC) 20 MG capsule Take 1 capsule (20 mg total) by mouth daily. 07/08/19  Yes Venia Carbon, MD  Levonorgestrel (SKYLA) 13.5 MG IUD by Intrauterine route.    [provider]    Allergies as of 07/20/2019 - Review Complete 05/24/2019  Allergen Reaction Noted  . Norvasc [amlodipine besylate] Other (See Comments) 12/11/2014    Family History  Problem Relation Age of Onset  . Hypertension Mother   . Cancer Mother        Bone Marrow   . Kidney disease Mother   . Arthritis Sister   . Cancer Paternal Grandfather        lung cancer  . Cancer Maternal Aunt        from snuff  . Dementia Maternal Aunt   . Bladder Cancer Maternal Aunt   . Graves' disease Other        multiple    Social History   Socioeconomic History  . Marital status: Single    Spouse name: Not on file  . Number of children: 1  . Years of  education: Not on file  . Highest education level: Not on file  Occupational History  . Occupation: [employer], Therapist, art  Tobacco Use  . Smoking status: Never Smoker  . Smokeless tobacco: Never Used  Substance and Sexual Activity  . Alcohol use: Yes    Comment: occasional  . Drug use: No  . Sexual activity: Yes    Partners: Male    Birth control/protection: Condom, I.U.D.  Other Topics Concern  . Not on file  Social History Narrative   1 daughter   Live in boyfriend since ~2008. He has 2 daughters   Social Determinants of Radio broadcast assistant Strain:   . Difficulty of Paying Living Expenses:   Food Insecurity:   . Worried About Charity fundraiser in the Last Year:   . Arboriculturist in the Last Year:   Transportation Needs:   . Film/video editor (Medical):   Marland Kitchen Lack of Transportation (Non-Medical):   Physical Activity:   . Days of Exercise per Week:   . Minutes of Exercise per Session:   Stress:   . Feeling of Stress :   Social Connections:   . Frequency of Communication with Friends and Family:   . Frequency of Social  Gatherings with Friends and Family:   . Attends Religious Services:   . Active Member of Clubs or Organizations:   . Attends Archivist Meetings:   Marland Kitchen Marital Status:   Intimate Partner Violence:   . Fear of Current or Ex-Partner:   . Emotionally Abused:   Marland Kitchen Physically Abused:   . Sexually Abused:     Review of Systems: See HPI, otherwise negative ROS  Physical Exam: BP (!) 156/110   Pulse 96   Temp (!) 97.4 F (36.3 C) (Temporal)   Resp 16   Ht 5\' 7"  (1.702 m)   Wt 98.9 kg   SpO2 96%   BMI 34.14 kg/m  General:   Alert,  pleasant and cooperative in NAD Head:  Normocephalic and atraumatic. Neck:  Supple; no masses or thyromegaly. Lungs:  Clear throughout to auscultation, normal respiratory effort.    Heart:  +S1, +S2, Regular rate and rhythm, No edema. Abdomen:  Soft, nontender and nondistended. Normal bowel  sounds, without guarding, and without rebound.   Neurologic:  Alert and  oriented x4;  grossly normal neurologically.  Impression/Plan: [name] is here for an colonoscopy to be performed for Screening colonoscopy average risk   Risks, benefits, limitations, and alternatives regarding  colonoscopy have been reviewed with the patient.  Questions have been answered.  All parties agreeable.   Jonathon Bellows, MD  08/15/2019, 9:13 AM

## 2019-08-15 NOTE — Anesthesia Preprocedure Evaluation (Signed)
Anesthesia Evaluation  Patient identified by MRN, date of birth, ID band Patient awake    Reviewed: Allergy & Precautions, H&P , NPO status , Patient's Chart, lab work & pertinent test results, reviewed documented beta blocker date and time   Airway Mallampati: II   Neck ROM: full    Dental  (+) Poor Dentition   Pulmonary neg pulmonary ROS, pneumonia, resolved,    Pulmonary exam normal        Cardiovascular hypertension, On Medications negative cardio ROS Normal cardiovascular exam Rhythm:regular Rate:Normal     Neuro/Psych negative neurological ROS  negative psych ROS   GI/Hepatic negative GI ROS, Neg liver ROS, GERD  Medicated,  Endo/Other  negative endocrine ROSHypothyroidism   Renal/GU negative Renal ROS  negative genitourinary   Musculoskeletal   Abdominal   Peds  Hematology negative hematology ROS (+)   Anesthesia Other Findings Past Medical History: No date: GERD (gastroesophageal reflux disease) No date: Hypertension No date: Leiomyoma of uterus, unspecified No date: Obesity No date: Pneumonia No date: Unspecified hypothyroidism Past Surgical History: 2000: DILATION AND CURETTAGE OF UTERUS   Reproductive/Obstetrics negative OB ROS                             Anesthesia Physical Anesthesia Plan  ASA: III  Anesthesia Plan: General   Post-op Pain Management:    Induction:   PONV Risk Score and Plan:   Airway Management Planned:   Additional Equipment:   Intra-op Plan:   Post-operative Plan:   Informed Consent: I have reviewed the patients History and Physical, chart, labs and discussed the procedure including the risks, benefits and alternatives for the proposed anesthesia with the patient or authorized representative who has indicated his/her understanding and acceptance.     Dental Advisory Given  Plan Discussed with: CRNA  Anesthesia Plan Comments:          Anesthesia Quick Evaluation

## 2019-09-22 ENCOUNTER — Other Ambulatory Visit: Payer: Self-pay

## 2019-09-22 DIAGNOSIS — Z1211 Encounter for screening for malignant neoplasm of colon: Secondary | ICD-10-CM

## 2019-09-22 MED ORDER — NA SULFATE-K SULFATE-MG SULF 17.5-3.13-1.6 GM/177ML PO SOLN: 354.0000 mL | Freq: Once | ORAL | 0 refills | Status: AC

## 2019-09-30 ENCOUNTER — Telehealth: Payer: Self-pay

## 2019-09-30 NOTE — Telephone Encounter (Signed)
Patient has been advised she can mix her SuPrep with any clear liquid suggested a water flavoring such as crystal light as long as its not red or purple.  Thanks,  [place], [place]

## 2019-10-04 ENCOUNTER — Other Ambulatory Visit: Payer: Self-pay

## 2019-10-04 ENCOUNTER — Other Ambulatory Visit
Admission: RE | Admit: 2019-10-04 | Discharge: 2019-10-04 | Disposition: A | Discharge status: Home / Self Care | Payer: 59 | Source: Ambulatory Visit | Attending: Gastroenterology | Admitting: Gastroenterology

## 2019-10-04 DIAGNOSIS — Z01812 Encounter for preprocedural laboratory examination: Secondary | ICD-10-CM | POA: Insufficient documentation

## 2019-10-04 DIAGNOSIS — Z20822 Contact with and (suspected) exposure to covid-19: Secondary | ICD-10-CM | POA: Diagnosis not present

## 2019-10-05 LAB — SARS CORONAVIRUS 2 (TAT 6-24 HRS): SARS Coronavirus 2: NEGATIVE

## 2019-10-06 ENCOUNTER — Encounter: Payer: Self-pay | Admitting: Gastroenterology

## 2019-10-06 ENCOUNTER — Ambulatory Visit: Payer: 59 | Admitting: Anesthesiology

## 2019-10-06 ENCOUNTER — Other Ambulatory Visit: Payer: Self-pay

## 2019-10-06 ENCOUNTER — Ambulatory Visit
Admission: RE | Admit: 2019-10-06 | Discharge: 2019-10-06 | Disposition: A | Discharge status: Home / Self Care | Payer: 59 | Attending: Gastroenterology | Admitting: Gastroenterology

## 2019-10-06 ENCOUNTER — Encounter
Admission: RE | Disposition: A | Discharge status: Home / Self Care | Payer: Self-pay | Source: Home / Self Care | Attending: Gastroenterology

## 2019-10-06 DIAGNOSIS — I10 Essential (primary) hypertension: Secondary | ICD-10-CM | POA: Diagnosis not present

## 2019-10-06 DIAGNOSIS — E669 Obesity, unspecified: Secondary | ICD-10-CM | POA: Insufficient documentation

## 2019-10-06 DIAGNOSIS — Z8249 Family history of ischemic heart disease and other diseases of the circulatory system: Secondary | ICD-10-CM | POA: Insufficient documentation

## 2019-10-06 DIAGNOSIS — Z79890 Hormone replacement therapy (postmenopausal): Secondary | ICD-10-CM | POA: Diagnosis not present

## 2019-10-06 DIAGNOSIS — Z1211 Encounter for screening for malignant neoplasm of colon: Secondary | ICD-10-CM | POA: Diagnosis present

## 2019-10-06 DIAGNOSIS — Z975 Presence of (intrauterine) contraceptive device: Secondary | ICD-10-CM | POA: Diagnosis not present

## 2019-10-06 DIAGNOSIS — D122 Benign neoplasm of ascending colon: Secondary | ICD-10-CM | POA: Diagnosis not present

## 2019-10-06 DIAGNOSIS — E039 Hypothyroidism, unspecified: Secondary | ICD-10-CM | POA: Diagnosis not present

## 2019-10-06 DIAGNOSIS — K219 Gastro-esophageal reflux disease without esophagitis: Secondary | ICD-10-CM | POA: Diagnosis not present

## 2019-10-06 DIAGNOSIS — Z79899 Other long term (current) drug therapy: Secondary | ICD-10-CM | POA: Insufficient documentation

## 2019-10-06 DIAGNOSIS — Z6834 Body mass index (BMI) 34.0-34.9, adult: Secondary | ICD-10-CM | POA: Insufficient documentation

## 2019-10-06 HISTORY — DX: Cardiac arrhythmia, unspecified: I49.9

## 2019-10-06 HISTORY — PX: COLONOSCOPY WITH PROPOFOL: SHX5780

## 2019-10-06 LAB — POCT PREGNANCY, URINE: Preg Test, Ur: NEGATIVE

## 2019-10-06 SURGERY — COLONOSCOPY WITH PROPOFOL
Anesthesia: General

## 2019-10-06 MED ORDER — PROPOFOL 500 MG/50ML IV EMUL
INTRAVENOUS | Status: DC | PRN
  Administered 2019-10-06: 175 ug/kg/min via INTRAVENOUS

## 2019-10-06 MED ORDER — PROPOFOL 10 MG/ML IV BOLUS
INTRAVENOUS | Status: DC | PRN
  Administered 2019-10-06: 60 mg via INTRAVENOUS

## 2019-10-06 MED ORDER — PHENYLEPHRINE HCL (PRESSORS) 10 MG/ML IV SOLN
INTRAVENOUS | Status: DC | PRN
  Administered 2019-10-06 (×2): 100 ug via INTRAVENOUS

## 2019-10-06 MED ORDER — SODIUM CHLORIDE 0.9 % IV SOLN
INTRAVENOUS | Status: DC
  Administered 2019-10-06: 1000 mL via INTRAVENOUS

## 2019-10-06 MED ORDER — SODIUM CHLORIDE (PF) 0.9 % IJ SOLN
INTRAMUSCULAR | Status: DC | PRN
  Administered 2019-10-06: 5 mL

## 2019-10-06 NOTE — Op Note (Signed)
[facility] Gastroenterology Patient Name: Kristina Gardner Procedure Date: 10/06/2019 8:29 AM MRN: [id number] Account #: [id number] Date of Birth: November 04, 1970 Admit Type: Outpatient Age: 49 Room: [facility] ENDO ROOM 1 Gender: Female Note Status: Finalized Procedure:             Colonoscopy Indications:           Screening for colorectal malignant neoplasm Providers:             Jonathon Bellows MD, MD Medicines:             Monitored Anesthesia Care Complications:         No immediate complications. Procedure:             Pre-Anesthesia Assessment:                        - Prior to the procedure, a History and Physical was                         performed, and patient medications, allergies and                         sensitivities were reviewed. The patient's tolerance                         of previous anesthesia was reviewed.                        - The risks and benefits of the procedure and the                         sedation options and risks were discussed with the                         patient. All questions were answered and informed                         consent was obtained.                        - ASA Grade Assessment: II - A patient with mild                         systemic disease.                        After obtaining informed consent, the colonoscope was                         passed under direct vision. Throughout the procedure,                         the patient's blood pressure, pulse, and oxygen                         saturations were monitored continuously. The                         Colonoscope was introduced through the anus and  advanced to the the cecum, identified by the                         appendiceal orifice. The colonoscopy was performed                         with ease. The patient tolerated the procedure well.                         The quality of the bowel preparation was good. Findings:      The  perianal and digital rectal examinations were normal.      A 15 mm polyp was found in the proximal ascending colon. The polyp was       semi-sessile. Preparations were made for mucosal resection. Saline was       injected to raise the lesion. Snare mucosal resection was performed.       Resection and retrieval were complete.      The exam was otherwise without abnormality on direct and retroflexion       views. Impression:            - One 15 mm polyp in the proximal ascending colon,                         removed with mucosal resection. Resected and retrieved.                        - The examination was otherwise normal on direct and                         retroflexion views.                        - Mucosal resection was performed. Resection and                         retrieval were complete. Recommendation:        - Discharge patient to home (with escort).                        - Resume previous diet.                        - Continue present medications.                        - Await pathology results.                        - Repeat colonoscopy for surveillance based on                         pathology results. Procedure Code(s):     --- Professional ---                        [phone], Colonoscopy, flexible; with endoscopic mucosal                         resection Diagnosis Code(s):     --- Professional ---  Z12.11, Encounter for screening for malignant neoplasm                         of colon                        K63.5, Polyp of colon CPT copyright 2019 American Medical Association. All rights reserved. The codes documented in this report are preliminary and upon coder review may  be revised to meet current compliance requirements. Jonathon Bellows, MD Jonathon Bellows MD, MD 10/06/2019 9:04:45 AM This report has been signed electronically. Number of Addenda: 0 Note Initiated On: 10/06/2019 8:29 AM Scope Withdrawal Time: 0 hours 18 minutes 42 seconds  Total  Procedure Duration: 0 hours 22 minutes 4 seconds  Estimated Blood Loss:  Estimated blood loss: none.      [facility]

## 2019-10-06 NOTE — Anesthesia Preprocedure Evaluation (Signed)
Anesthesia Evaluation  Patient identified by MRN, date of birth, ID band Patient awake    Reviewed: Allergy & Precautions, H&P , NPO status , Patient's Chart, lab work & pertinent test results, reviewed documented beta blocker date and time   History of Anesthesia Complications Negative for: history of anesthetic complications  Airway Mallampati: II   Neck ROM: full    Dental  (+) Poor Dentition   Pulmonary pneumonia, resolved,    Pulmonary exam normal        Cardiovascular Exercise Tolerance: Good hypertension, On Medications (-) angina(-) Past MI and (-) Cardiac Stents Normal cardiovascular exam+ dysrhythmias (-) Valvular Problems/Murmurs Rhythm:regular Rate:Normal     Neuro/Psych negative neurological ROS  negative psych ROS   GI/Hepatic Neg liver ROS, GERD  Medicated,  Endo/Other  neg diabetesHypothyroidism   Renal/GU negative Renal ROS  negative genitourinary   Musculoskeletal   Abdominal   Peds  Hematology negative hematology ROS (+)   Anesthesia Other Findings Past Medical History: No date: GERD (gastroesophageal reflux disease) No date: Hypertension No date: Leiomyoma of uterus, unspecified No date: Obesity No date: Pneumonia No date: Unspecified hypothyroidism Past Surgical History: 2000: DILATION AND CURETTAGE OF UTERUS   Reproductive/Obstetrics negative OB ROS                             Anesthesia Physical  Anesthesia Plan  ASA: III  Anesthesia Plan: General   Post-op Pain Management:    Induction: Intravenous  PONV Risk Score and Plan: 3 and Propofol infusion and TIVA  Airway Management Planned: Natural Airway and Nasal Cannula  Additional Equipment:   Intra-op Plan:   Post-operative Plan:   Informed Consent: I have reviewed the patients History and Physical, chart, labs and discussed the procedure including the risks, benefits and alternatives for the  proposed anesthesia with the patient or authorized representative who has indicated his/her understanding and acceptance.     Dental Advisory Given  Plan Discussed with: CRNA  Anesthesia Plan Comments:         Anesthesia Quick Evaluation

## 2019-10-06 NOTE — H&P (Signed)
Jonathon Bellows, MD [address], [place], [place], [place], [address] [address], [place], [place], [place], [address] Phone: [phone]  Fax: [phone]  Primary Care Physician:  Venia Carbon, MD   Pre-Procedure History & Physical: HPI:  Kristina Gardner is a 49 y.o. female is here for an colonoscopy.   Past Medical History:  Diagnosis Date  . Dysrhythmia   . GERD (gastroesophageal reflux disease)   . Hypertension   . Leiomyoma of uterus, unspecified   . Obesity   . Pneumonia   . Unspecified hypothyroidism     Past Surgical History:  Procedure Laterality Date  . COLONOSCOPY WITH PROPOFOL N/A 08/15/2019   Procedure: COLONOSCOPY WITH PROPOFOL;  Surgeon: Jonathon Bellows, MD;  Location: [facility] ENDOSCOPY;  Service: Gastroenterology;  Laterality: N/A;  . DILATION AND CURETTAGE OF UTERUS  2000    Prior to Admission medications   Medication Sig Start Date End Date Taking? Authorizing Provider  DILT-XR 180 MG 24 hr capsule TAKE 1 CAPSULE BY MOUTH  DAILY 04/15/19  Yes Viviana Simpler I, MD  levothyroxine (SYNTHROID) 100 MCG tablet TAKE 1 TABLET BY MOUTH  DAILY 06/18/19  Yes Venia Carbon, MD  losartan-hydrochlorothiazide (HYZAAR) 100-25 MG tablet TAKE 1 TABLET BY MOUTH EVERY DAY 08/11/19  Yes Venia Carbon, MD  omeprazole (PRILOSEC) 20 MG capsule Take 1 capsule (20 mg total) by mouth daily. 07/08/19  Yes Venia Carbon, MD  Levonorgestrel (SKYLA) 13.5 MG IUD by Intrauterine route.    [provider]    Allergies as of 09/22/2019 - Review Complete 08/15/2019  Allergen Reaction Noted  . Norvasc [amlodipine besylate] Other (See Comments) 12/11/2014    Family History  Problem Relation Age of Onset  . Hypertension Mother   . Cancer Mother        Bone Marrow   . Kidney disease Mother   . Arthritis Sister   . Cancer Paternal Grandfather        lung cancer  . Cancer Maternal Aunt        from snuff  . Dementia Maternal Aunt   . Bladder Cancer  Maternal Aunt   . Graves' disease Other        multiple    Social History   Socioeconomic History  . Marital status: Single    Spouse name: Not on file  . Number of children: 1  . Years of education: Not on file  . Highest education level: Not on file  Occupational History  . Occupation: [employer], Therapist, art  Tobacco Use  . Smoking status: Never Smoker  . Smokeless tobacco: Never Used  Substance and Sexual Activity  . Alcohol use: Yes    Comment: occasional  . Drug use: No  . Sexual activity: Yes    Partners: Male    Birth control/protection: Condom, I.U.D.  Other Topics Concern  . Not on file  Social History Narrative   1 daughter   Live in boyfriend since ~2008. He has 2 daughters   Social Determinants of Radio broadcast assistant Strain:   . Difficulty of Paying Living Expenses:   Food Insecurity:   . Worried About Charity fundraiser in the Last Year:   . Arboriculturist in the Last Year:   Transportation Needs:   . Film/video editor (Medical):   Marland Kitchen Lack of Transportation (Non-Medical):   Physical Activity:   . Days of Exercise per Week:   . Minutes of Exercise  per Session:   Stress:   . Feeling of Stress :   Social Connections:   . Frequency of Communication with Friends and Family:   . Frequency of Social Gatherings with Friends and Family:   . Attends Religious Services:   . Active Member of Clubs or Organizations:   . Attends Archivist Meetings:   Marland Kitchen Marital Status:   Intimate Partner Violence:   . Fear of Current or Ex-Partner:   . Emotionally Abused:   Marland Kitchen Physically Abused:   . Sexually Abused:     Review of Systems: See HPI, otherwise negative ROS  Physical Exam: BP (!) 137/104   Pulse 90   Temp (!) 96.2 F (35.7 C) (Tympanic)   Resp 16   Ht 5\' 6"  (1.676 m)   Wt 97.1 kg   SpO2 98%   BMI 34.54 kg/m  General:   Alert,  pleasant and cooperative in NAD Head:  Normocephalic and atraumatic. Neck:  Supple; no masses  or thyromegaly. Lungs:  Clear throughout to auscultation, normal respiratory effort.    Heart:  +S1, +S2, Regular rate and rhythm, No edema. Abdomen:  Soft, nontender and nondistended. Normal bowel sounds, without guarding, and without rebound.   Neurologic:  Alert and  oriented x4;  grossly normal neurologically.  Impression/Plan: [name] is here for an colonoscopy to be performed for Screening colonoscopy average risk   Risks, benefits, limitations, and alternatives regarding  colonoscopy have been reviewed with the patient.  Questions have been answered.  All parties agreeable.   Jonathon Bellows, MD  10/06/2019, 8:30 AM

## 2019-10-06 NOTE — Transfer of Care (Signed)
Immediate Anesthesia Transfer of Care Note  Patient: Kristina Gardner  Procedure(s) Performed: COLONOSCOPY WITH PROPOFOL (N/A )  Patient Location: PACU  Anesthesia Type:General  Level of Consciousness: awake and sedated  Airway & Oxygen Therapy: Patient Spontanous Breathing and Patient connected to nasal cannula oxygen  Post-op Assessment: Report given to RN and Post -op Vital signs reviewed and stable  Post vital signs: Reviewed and stable  Last Vitals:  Vitals Value Taken Time  BP 150/96 10/06/19 0909  Temp    Pulse 93 10/06/19 0910  Resp 24 10/06/19 0910  SpO2 92 % 10/06/19 0910  Vitals shown include unvalidated device data.  Last Pain:  Vitals:   10/06/19 0809  TempSrc: Tympanic  PainSc: 0-No pain         Complications: No apparent anesthesia complications

## 2019-10-06 NOTE — Anesthesia Postprocedure Evaluation (Signed)
Anesthesia Post Note  Patient: Kristina Gardner  Procedure(s) Performed: COLONOSCOPY WITH PROPOFOL (N/A )  Patient location during evaluation: Endoscopy Anesthesia Type: General Level of consciousness: awake and alert Pain management: pain level controlled Vital Signs Assessment: post-procedure vital signs reviewed and stable Respiratory status: spontaneous breathing, nonlabored ventilation, respiratory function stable and patient connected to nasal cannula oxygen Cardiovascular status: blood pressure returned to baseline and stable Postop Assessment: no apparent nausea or vomiting Anesthetic complications: no     Last Vitals:  Vitals:   10/06/19 0922 10/06/19 0939  BP: 109/74 (!) 121/91  Pulse:    Resp:    Temp:    SpO2:      Last Pain:  Vitals:   10/06/19 0939  TempSrc:   PainSc: 0-No pain                 Martha Clan

## 2019-10-06 NOTE — Anesthesia Procedure Notes (Signed)
Date/Time: 10/06/2019 8:46 AM Performed by: Nelda Marseille, CRNA Pre-anesthesia Checklist: Patient identified, Emergency Drugs available, Suction available, Patient being monitored and Timeout performed Oxygen Delivery Method: Nasal cannula

## 2019-10-07 LAB — SURGICAL PATHOLOGY

## 2019-10-11 ENCOUNTER — Encounter: Payer: Self-pay | Admitting: Gastroenterology

## 2020-02-03 ENCOUNTER — Telehealth (INDEPENDENT_AMBULATORY_CARE_PROVIDER_SITE_OTHER): Payer: 59 | Admitting: Internal Medicine

## 2020-02-03 ENCOUNTER — Encounter: Payer: Self-pay | Admitting: Internal Medicine

## 2020-02-03 ENCOUNTER — Other Ambulatory Visit: Payer: Self-pay

## 2020-02-03 DIAGNOSIS — J9801 Acute bronchospasm: Secondary | ICD-10-CM

## 2020-02-03 MED ORDER — MONTELUKAST SODIUM 10 MG PO TABS
10.0000 mg | ORAL_TABLET | Freq: Every day | ORAL | 3 refills | Status: DC
Start: 2020-02-03 — End: 2020-05-25

## 2020-02-03 NOTE — Progress Notes (Signed)
Subjective:    Patient ID: Kristina Gardner, female    DOB: 13-Nov-1970, 49 y.o.   MRN: [id number]  HPI Video virtual visit due to respiratory symptoms Identification done Reviewed limitations and billing--and she gave consent Participants--patient in her home and I am in my office  Has noticed problems at work--mostly because it is so cold--she works in the lab directly Gets heavy feeling in her chest Not much cough Sometimes has sense of SOB May have some mild symptoms outside of work--but usually not much Not sick  Current Outpatient Medications on File Prior to Visit  Medication Sig Dispense Refill  . DILT-XR 180 MG 24 hr capsule TAKE 1 CAPSULE BY MOUTH  DAILY 90 capsule 3  . Levonorgestrel (SKYLA) 13.5 MG IUD by Intrauterine route.    Marland Kitchen levothyroxine (SYNTHROID) 100 MCG tablet TAKE 1 TABLET BY MOUTH  DAILY 90 tablet 3  . losartan-hydrochlorothiazide (HYZAAR) 100-25 MG tablet TAKE 1 TABLET BY MOUTH EVERY DAY 90 tablet 3  . omeprazole (PRILOSEC) 20 MG capsule Take 1 capsule (20 mg total) by mouth daily. 180 capsule 3   No current facility-administered medications on file prior to visit.    Allergies  Allergen Reactions  . Norvasc [Amlodipine Besylate] Other (See Comments)    dizziness    Past Medical History:  Diagnosis Date  . Dysrhythmia   . GERD (gastroesophageal reflux disease)   . Hypertension   . Leiomyoma of uterus, unspecified   . Obesity   . Pneumonia   . Unspecified hypothyroidism     Past Surgical History:  Procedure Laterality Date  . COLONOSCOPY WITH PROPOFOL N/A 08/15/2019   Procedure: COLONOSCOPY WITH PROPOFOL;  Surgeon: Jonathon Bellows, MD;  Location[facility] ENDOSCOPY;  Service: Gastroenterology;  Laterality: N/A;  . COLONOSCOPY WITH PROPOFOL N/A 10/06/2019   Procedure: COLONOSCOPY WITH PROPOFOL;  Surgeon: Jonathon Bellows, MD;  Location: [facility] ENDOSCOPY;  Service: Gastroenterology;  Laterality: N/A;  . DILATION AND CURETTAGE OF UTERUS  2000    Family  History  Problem Relation Age of Onset  . Hypertension Mother   . Cancer Mother        Bone Marrow   . Kidney disease Mother   . Arthritis Sister   . Cancer Paternal Grandfather        lung cancer  . Cancer Maternal Aunt        from snuff  . Dementia Maternal Aunt   . Bladder Cancer Maternal Aunt   . Graves' disease Other        multiple    Social History   Socioeconomic History  . Marital status: Single    Spouse name: Not on file  . Number of children: 1  . Years of education: Not on file  . Highest education level: Not on file  Occupational History  . Occupation: [employer], Therapist, art  Tobacco Use  . Smoking status: Never Smoker  . Smokeless tobacco: Never Used  Substance and Sexual Activity  . Alcohol use: Yes    Comment: occasional  . Drug use: No  . Sexual activity: Yes    Partners: Male    Birth control/protection: Condom, I.U.D.  Other Topics Concern  . Not on file  Social History Narrative   1 daughter   Live in boyfriend since ~2008. He has 2 daughters   Social Determinants of Radio broadcast assistant Strain:   . Difficulty of Paying Living Expenses: Not on file  Food Insecurity:   . Worried About Estate manager/land agent  of Food in the Last Year: Not on file  . Ran Out of Food in the Last Year: Not on file  Transportation Needs:   . Lack of Transportation (Medical): Not on file  . Lack of Transportation (Non-Medical): Not on file  Physical Activity:   . Days of Exercise per Week: Not on file  . Minutes of Exercise per Session: Not on file  Stress:   . Feeling of Stress : Not on file  Social Connections:   . Frequency of Communication with Friends and Family: Not on file  . Frequency of Social Gatherings with Friends and Family: Not on file  . Attends Religious Services: Not on file  . Active Member of Clubs or Organizations: Not on file  . Attends Archivist Meetings: Not on file  . Marital Status: Not on file  Intimate Partner Violence:    . Fear of Current or Ex-Partner: Not on file  . Emotionally Abused: Not on file  . Physically Abused: Not on file  . Sexually Abused: Not on file    Review of Systems No fever No loss of smell or taste Feels fine other than work No history of asthma---but her daughter and granddaughter have it    Objective:   Physical Exam Constitutional:      Appearance: Normal appearance.  Pulmonary:     Effort: Pulmonary effort is normal. No respiratory distress.  Neurological:     Mental Status: She is alert.  Psychiatric:        Mood and Affect: Mood normal.        Behavior: Behavior normal.            Assessment & Plan:

## 2020-02-03 NOTE — Assessment & Plan Note (Signed)
Really sounds like cold induced asthma/bronchospasm No prior asthma but does have in family Will try nightly montelukast----she can hold it when she is on vacation (or if not so bad in winter when cold air is not being pumped in) If this doesn't work, would try albuterol inhaler  Next would be in person evaluation----perhaps by pulmonary (probably would need PFT's---?provocation)

## 2020-03-08 ENCOUNTER — Other Ambulatory Visit: Payer: Self-pay

## 2020-03-08 MED ORDER — DILTIAZEM HCL ER 180 MG PO CP24: 180.0000 mg | ORAL_CAPSULE | Freq: Every day | ORAL | 3 refills | Status: DC

## 2020-03-08 NOTE — Telephone Encounter (Signed)
Rx sent electronically.  

## 2020-04-05 ENCOUNTER — Other Ambulatory Visit: Payer: Self-pay

## 2020-04-05 ENCOUNTER — Encounter: Payer: Self-pay | Admitting: Family Medicine

## 2020-04-05 ENCOUNTER — Ambulatory Visit (INDEPENDENT_AMBULATORY_CARE_PROVIDER_SITE_OTHER): Payer: 59 | Admitting: Family Medicine

## 2020-04-05 VITALS — BP 150/100 | HR 86 | Ht 66.0 in | Wt 229.2 lb

## 2020-04-05 DIAGNOSIS — I10 Essential (primary) hypertension: Secondary | ICD-10-CM | POA: Diagnosis not present

## 2020-04-05 DIAGNOSIS — Z124 Encounter for screening for malignant neoplasm of cervix: Secondary | ICD-10-CM

## 2020-04-05 DIAGNOSIS — Z01419 Encounter for gynecological examination (general) (routine) without abnormal findings: Secondary | ICD-10-CM

## 2020-04-05 DIAGNOSIS — D251 Intramural leiomyoma of uterus: Secondary | ICD-10-CM

## 2020-04-05 DIAGNOSIS — D250 Submucous leiomyoma of uterus: Secondary | ICD-10-CM

## 2020-04-05 DIAGNOSIS — N951 Menopausal and female climacteric states: Secondary | ICD-10-CM | POA: Insufficient documentation

## 2020-04-05 NOTE — Assessment & Plan Note (Signed)
DASH diet reviewed. States she is taking meds--may need to add 4th agent.

## 2020-04-05 NOTE — Progress Notes (Signed)
  Subjective:     Kristina Gardner is a 49 y.o. female and is here for a comprehensive physical exam. The patient reports problems - pelvic pain. Has Liletta in since 2017--no cycles. Has h/o UFE for treatment of fibroids in 2013. Has been using IUD for bleeding since. Has not wanted hysterectomy in the past. Had pain during sex midline, low in the abdomen, gradually worsened during intercourse, then resolved. Has had sex again and it did not hurt but not using the same position.Sometimes having hot flashes Sometimes has some cramping low abdominal pain.    The following portions of the patient's history were reviewed and updated as appropriate: allergies, current medications, past family history, past medical history, past social history, past surgical history and problem list.  Review of Systems Pertinent items noted in HPI and remainder of comprehensive ROS otherwise negative.   Objective:    BP (!) 150/100   Pulse 86   Ht 5\' 6"  (1.676 m)   Wt 229 lb 3.2 oz (104 kg)   BMI 36.99 kg/m  General appearance: alert, cooperative and appears stated age Head: Normocephalic, without obvious abnormality, atraumatic Neck: no adenopathy, supple, symmetrical, trachea midline and thyroid not enlarged, symmetric, no tenderness/mass/nodules Lungs: clear to auscultation bilaterally Breasts: normal appearance, no masses or tenderness Heart: regular rate and rhythm, S1, S2 normal, no murmur, click, rub or gallop Abdomen: soft, non-tender; bowel sounds normal; no masses,  no organomegaly Pelvic: external genitalia normal, no adnexal masses or tenderness, no cervical motion tenderness, vagina normal without discharge and uterus with very firm nodularity anteriorly c/w calcified fibroid and quite tender, overall size does not feel > 10 wks size Extremities: Homans sign is negative, no sign of DVT Pulses: 2+ and symmetric Skin: Skin color, texture, turgor normal. No rashes or lesions Lymph nodes:  Cervical, supraclavicular, and axillary nodes normal. Neurologic: Grossly normal    Assessment:    GYN female exam.      Plan:   Problem List Items Addressed This Visit      Unprioritized   Leiomyoma of uterus    Still with pain at times. Reviewed hysterectomy options, diet. She will consider options.      Essential hypertension, benign - Primary    DASH diet reviewed. States she is taking meds--may need to add 4th agent.      Hot flashes due to menopause    Has tried Estroven--not a good candidate for HRT due to BP, risks. Discussed SSRIs, but she was not interested. May try black cohosh and diet.       Other Visit Diagnoses    Screening for malignant neoplasm of cervix       Relevant Orders   IGP, Aptima HPV, rfx 16/18,45   Encounter for gynecological examination without abnormal finding       Relevant Orders   MM 3D SCREEN BREAST BILATERAL     Return in 1 year (on 04/05/2021).    See After Visit Summary for Counseling Recommendations

## 2020-04-05 NOTE — Progress Notes (Signed)
Works for [facility]: 2021

## 2020-04-05 NOTE — Patient Instructions (Signed)
DASH Eating Plan DASH stands for "Dietary Approaches to Stop Hypertension." The DASH eating plan is a healthy eating plan that has been shown to reduce high blood pressure (hypertension). It may also reduce your risk for type 2 diabetes, heart disease, and stroke. The DASH eating plan may also help with weight loss. What are tips for following this plan?  General guidelines  Avoid eating more than 2,300 mg (milligrams) of salt (sodium) a day. If you have hypertension, you may need to reduce your sodium intake to 1,500 mg a day.  Limit alcohol intake to no more than 1 drink a day for nonpregnant women and 2 drinks a day for men. One drink equals 12 oz of beer, 5 oz of wine, or 1 oz of hard liquor.  Work with your health care provider to maintain a healthy body weight or to lose weight. Ask what an ideal weight is for you.  Get at least 30 minutes of exercise that causes your heart to beat faster (aerobic exercise) most days of the week. Activities may include walking, swimming, or biking.  Work with your health care provider or diet and nutrition specialist (dietitian) to adjust your eating plan to your individual calorie needs. Reading food labels   Check food labels for the amount of sodium per serving. Choose foods with less than 5 percent of the Daily Value of sodium. Generally, foods with less than 300 mg of sodium per serving fit into this eating plan.  To find whole grains, look for the word "whole" as the first word in the ingredient list. Shopping  Buy products labeled as "low-sodium" or "no salt added."  Buy fresh foods. Avoid canned foods and premade or frozen meals. Cooking  Avoid adding salt when cooking. Use salt-free seasonings or herbs instead of table salt or sea salt. Check with your health care provider or pharmacist before using salt substitutes.  Do not fry foods. Cook foods using healthy methods such as baking, boiling, grilling, and broiling instead.  Cook with  heart-healthy oils, such as olive, canola, soybean, or sunflower oil. Meal planning  Eat a balanced diet that includes: ? 5 or more servings of fruits and vegetables each day. At each meal, try to fill half of your plate with fruits and vegetables. ? Up to 6-8 servings of whole grains each day. ? Less than 6 oz of lean meat, poultry, or fish each day. A 3-oz serving of meat is about the same size as a deck of cards. One egg equals 1 oz. ? 2 servings of low-fat dairy each day. ? A serving of nuts, seeds, or beans 5 times each week. ? Heart-healthy fats. Healthy fats called Omega-3 fatty acids are found in foods such as flaxseeds and coldwater fish, like sardines, salmon, and mackerel.  Limit how much you eat of the following: ? Canned or prepackaged foods. ? Food that is high in trans fat, such as fried foods. ? Food that is high in saturated fat, such as fatty meat. ? Sweets, desserts, sugary drinks, and other foods with added sugar. ? Full-fat dairy products.  Do not salt foods before eating.  Try to eat at least 2 vegetarian meals each week.  Eat more home-cooked food and less restaurant, buffet, and fast food.  When eating at a restaurant, ask that your food be prepared with less salt or no salt, if possible. What foods are recommended? The items listed may not be a complete list. Talk with your dietitian about   what dietary choices are best for you. Grains Whole-grain or whole-wheat bread. Whole-grain or whole-wheat pasta. Brown rice. Oatmeal. Quinoa. Bulgur. Whole-grain and low-sodium cereals. Pita bread. Low-fat, low-sodium crackers. Whole-wheat flour tortillas. Vegetables Fresh or frozen vegetables (raw, steamed, roasted, or grilled). Low-sodium or reduced-sodium tomato and vegetable juice. Low-sodium or reduced-sodium tomato sauce and tomato paste. Low-sodium or reduced-sodium canned vegetables. Fruits All fresh, dried, or frozen fruit. Canned fruit in natural juice (without  added sugar). Meat and other protein foods Skinless chicken or turkey. Ground chicken or turkey. Pork with fat trimmed off. Fish and seafood. Egg whites. Dried beans, peas, or lentils. Unsalted nuts, nut butters, and seeds. Unsalted canned beans. Lean cuts of beef with fat trimmed off. Low-sodium, lean deli meat. Dairy Low-fat (1%) or fat-free (skim) milk. Fat-free, low-fat, or reduced-fat cheeses. Nonfat, low-sodium ricotta or cottage cheese. Low-fat or nonfat yogurt. Low-fat, low-sodium cheese. Fats and oils Soft margarine without trans fats. Vegetable oil. Low-fat, reduced-fat, or light mayonnaise and salad dressings (reduced-sodium). Canola, safflower, olive, soybean, and sunflower oils. Avocado. Seasoning and other foods Herbs. Spices. Seasoning mixes without salt. Unsalted popcorn and pretzels. Fat-free sweets. What foods are not recommended? The items listed may not be a complete list. Talk with your dietitian about what dietary choices are best for you. Grains Baked goods made with fat, such as croissants, muffins, or some breads. Dry pasta or rice meal packs. Vegetables Creamed or fried vegetables. Vegetables in a cheese sauce. Regular canned vegetables (not low-sodium or reduced-sodium). Regular canned tomato sauce and paste (not low-sodium or reduced-sodium). Regular tomato and vegetable juice (not low-sodium or reduced-sodium). Pickles. Olives. Fruits Canned fruit in a light or heavy syrup. Fried fruit. Fruit in cream or butter sauce. Meat and other protein foods Fatty cuts of meat. Ribs. Fried meat. Bacon. Sausage. Bologna and other processed lunch meats. Salami. Fatback. Hotdogs. Bratwurst. Salted nuts and seeds. Canned beans with added salt. Canned or smoked fish. Whole eggs or egg yolks. Chicken or turkey with skin. Dairy Whole or 2% milk, cream, and half-and-half. Whole or full-fat cream cheese. Whole-fat or sweetened yogurt. Full-fat cheese. Nondairy creamers. Whipped toppings.  Processed cheese and cheese spreads. Fats and oils Butter. Stick margarine. Lard. Shortening. Ghee. Bacon fat. Tropical oils, such as coconut, palm kernel, or palm oil. Seasoning and other foods Salted popcorn and pretzels. Onion salt, garlic salt, seasoned salt, table salt, and sea salt. Worcestershire sauce. Tartar sauce. Barbecue sauce. Teriyaki sauce. Soy sauce, including reduced-sodium. Steak sauce. Canned and packaged gravies. Fish sauce. Oyster sauce. Cocktail sauce. Horseradish that you find on the shelf. Ketchup. Mustard. Meat flavorings and tenderizers. Bouillon cubes. Hot sauce and Tabasco sauce. Premade or packaged marinades. Premade or packaged taco seasonings. Relishes. Regular salad dressings. Where to find more information:  National Heart, Lung, and Blood Institute: www.nhlbi.nih.gov  American Heart Association: www.heart.org Summary  The DASH eating plan is a healthy eating plan that has been shown to reduce high blood pressure (hypertension). It may also reduce your risk for type 2 diabetes, heart disease, and stroke.  With the DASH eating plan, you should limit salt (sodium) intake to 2,300 mg a day. If you have hypertension, you may need to reduce your sodium intake to 1,500 mg a day.  When on the DASH eating plan, aim to eat more fresh fruits and vegetables, whole grains, lean proteins, low-fat dairy, and heart-healthy fats.  Work with your health care provider or diet and nutrition specialist (dietitian) to adjust your eating plan to your   individual calorie needs. This information is not intended to replace advice given to you by your health care provider. Make sure you discuss any questions you have with your health care provider. Document Revised: 04/03/2017 Document Reviewed: 04/14/2016 Elsevier Patient Education  2020 Elsevier Inc.  

## 2020-04-05 NOTE — Assessment & Plan Note (Signed)
Still with pain at times. Reviewed hysterectomy options, diet. She will consider options.

## 2020-04-05 NOTE — Assessment & Plan Note (Signed)
Has tried Estroven--not a good candidate for HRT due to BP, risks. Discussed SSRIs, but she was not interested. May try black cohosh and diet.

## 2020-04-10 LAB — IGP, APTIMA HPV, RFX 16/18,45
HPV Aptima: NEGATIVE
PAP Smear Comment: 0

## 2020-05-10 ENCOUNTER — Other Ambulatory Visit: Payer: Self-pay | Admitting: Internal Medicine

## 2020-05-25 ENCOUNTER — Other Ambulatory Visit: Payer: Self-pay

## 2020-05-25 ENCOUNTER — Encounter: Payer: Self-pay | Admitting: Internal Medicine

## 2020-05-25 ENCOUNTER — Ambulatory Visit (INDEPENDENT_AMBULATORY_CARE_PROVIDER_SITE_OTHER): Payer: 59 | Admitting: Internal Medicine

## 2020-05-25 VITALS — BP 128/88 | HR 84 | Temp 97.1°F | Ht 67.25 in | Wt 224.0 lb

## 2020-05-25 DIAGNOSIS — I10 Essential (primary) hypertension: Secondary | ICD-10-CM

## 2020-05-25 DIAGNOSIS — Z0000 Encounter for general adult medical examination without abnormal findings: Secondary | ICD-10-CM | POA: Diagnosis not present

## 2020-05-25 DIAGNOSIS — E890 Postprocedural hypothyroidism: Secondary | ICD-10-CM | POA: Diagnosis not present

## 2020-05-25 MED ORDER — MONTELUKAST SODIUM 10 MG PO TABS: 10.0000 mg | ORAL_TABLET | Freq: Every day | ORAL | 3 refills | Status: DC

## 2020-05-25 MED ORDER — KETOCONAZOLE 2 % EX CREA: 1.0000 "application " | TOPICAL_CREAM | Freq: Every day | CUTANEOUS | 1 refills | Status: DC

## 2020-05-25 NOTE — Assessment & Plan Note (Signed)
Healthy Stress with work and caring for sister---discussed not going every day and working on fitness Recent colon--due again in 3 years Has mammogram next week Recent pap Got COVID vaccine--considering the flu vaccine

## 2020-05-25 NOTE — Assessment & Plan Note (Signed)
BP Readings from Last 3 Encounters:  05/25/20 128/88  04/05/20 (!) 150/100  02/03/20 (!) 129/95   Okay on diltiazem and losartan/HCTZ Due for labs

## 2020-05-25 NOTE — Patient Instructions (Addendum)
Please try loratadine 10mg  daily or cetirizine 10mg  daily at bedtime to see if it helps the runny nose and red eyes. Please get your eyes checked! Try miralax or 2 senna-s tabs daily or every other day to help your bowels.

## 2020-05-25 NOTE — Assessment & Plan Note (Signed)
After Rx for Graves Will check labs

## 2020-05-25 NOTE — Addendum Note (Signed)
Addended by: Viviana Simpler I on: 05/25/2020 10:51 AM   Modules accepted: Orders

## 2020-05-25 NOTE — Progress Notes (Signed)
Subjective:    Patient ID: Kristina Gardner, female    DOB: 23-Nov-1970, 50 y.o.   MRN: [id number]  HPI Here for physical This visit occurred during the SARS-CoV-2 public health emergency.  Safety protocols were in place, including screening questions prior to the visit, additional usage of staff PPE, and extensive cleaning of exam room while observing appropriate contact time as indicated for disinfecting solutions.   Has noticed red eyes No pain No allergy symptoms--but does have runny nose (drains down throat)  Has had several falls Minor injuries--like scraped knee Once she slipped on a tree dropping, another time she tripped over something in her room, another time she slipped on ice, another time missed a step coming down her steps No leg weakness No change in sensation in feet No fitness efforts---still takes care of sister, works 8-6PM all weekdays  Stopped deodorant for a while---inflamed red rash Used a powder for a while--helped Now raw spot in left groin  Review of Systems  Constitutional:       Weight up some--but has lost 5# in the past month or so Wears seat belt  HENT: Negative for dental problem, hearing loss, tinnitus and trouble swallowing.        Keeps up with dentist  Eyes: Positive for redness. Negative for visual disturbance.  Respiratory: Positive for chest tightness. Negative for cough.        Did use boyfriend's inhaler when she was tight in chest---ran out of montelukast  Cardiovascular: Negative for chest pain and palpitations.       Very slight ankle swelling at times  Gastrointestinal: Positive for constipation. Negative for blood in stool.       Heartburn controlled with daily med  Endocrine: Negative for polydipsia and polyuria.  Genitourinary: Positive for dyspareunia. Negative for dysuria and hematuria.       Recent gyn visit  Musculoskeletal: Negative for back pain and joint swelling.       Some pain in elbows  Skin:       No suspicious  lesions  Allergic/Immunologic: Negative for environmental allergies and immunocompromised state.  Neurological: Negative for dizziness, syncope and light-headedness.       Regular headaches--excedrin helps  Hematological: Negative for adenopathy. Does not bruise/bleed easily.  Psychiatric/Behavioral: Positive for sleep disturbance. Negative for dysphoric mood. The patient is not nervous/anxious.        Gets stressed out caring for sister--still mean and picky about her despite her helping her       Objective:   Physical Exam Constitutional:      Appearance: Normal appearance.  HENT:     Right Ear: Tympanic membrane, ear canal and external ear normal.     Left Ear: Tympanic membrane, ear canal and external ear normal.     Mouth/Throat:     Pharynx: No oropharyngeal exudate or posterior oropharyngeal erythema.  Eyes:     Conjunctiva/sclera: Conjunctivae normal.     Pupils: Pupils are equal, round, and reactive to light.  Cardiovascular:     Rate and Rhythm: Normal rate and regular rhythm.     Pulses: Normal pulses.     Heart sounds: No murmur heard. No gallop.   Pulmonary:     Effort: Pulmonary effort is normal.     Breath sounds: Normal breath sounds. No wheezing or rales.  Abdominal:     Palpations: Abdomen is soft.     Tenderness: There is no abdominal tenderness.  Musculoskeletal:     Cervical back:  Neck supple.     Right lower leg: No edema.     Left lower leg: No edema.  Lymphadenopathy:     Cervical: No cervical adenopathy.  Skin:    General: Skin is warm.     Comments: Slight red area in left lower inguinal area  Neurological:     General: No focal deficit present.     Mental Status: She is alert and oriented to person, place, and time.  Psychiatric:        Mood and Affect: Mood normal.        Behavior: Behavior normal.            Assessment & Plan:

## 2020-05-26 ENCOUNTER — Other Ambulatory Visit: Payer: Self-pay | Admitting: Internal Medicine

## 2020-05-26 DIAGNOSIS — R7309 Other abnormal glucose: Secondary | ICD-10-CM

## 2020-05-26 LAB — LIPID PANEL
Chol/HDL Ratio: 5.8 ratio — ABNORMAL HIGH (ref 0.0–4.4)
Cholesterol, Total: 282 mg/dL — ABNORMAL HIGH (ref 100–199)
HDL: 49 mg/dL (ref >39)
LDL Chol Calc (NIH): 207 mg/dL — ABNORMAL HIGH (ref 0–99)
Triglycerides: 139 mg/dL (ref 0–149)
VLDL Cholesterol Cal: 26 mg/dL (ref 5–40)

## 2020-05-26 LAB — T4, FREE: Free T4: 1.51 ng/dL (ref 0.82–1.77)

## 2020-05-26 LAB — COMPREHENSIVE METABOLIC PANEL
ALT: 18 IU/L (ref 0–32)
AST: 17 IU/L (ref 0–40)
Albumin/Globulin Ratio: 1.5 (ref 1.2–2.2)
Albumin: 4.7 g/dL (ref 3.8–4.9)
Alkaline Phosphatase: 198 IU/L — ABNORMAL HIGH (ref 44–121)
BUN/Creatinine Ratio: 11 (ref 9–23)
BUN: 11 mg/dL (ref 6–24)
Bilirubin Total: 0.6 mg/dL (ref 0.0–1.2)
CO2: 28 mmol/L (ref 20–29)
Calcium: 10.3 mg/dL — ABNORMAL HIGH (ref 8.7–10.2)
Chloride: 97 mmol/L (ref 96–106)
Creatinine, Ser: 0.96 mg/dL (ref 0.57–1.00)
GFR calc Af Amer: 79 mL/min/{1.73_m2} (ref >59)
GFR calc non Af Amer: 69 mL/min/{1.73_m2} (ref >59)
Globulin, Total: 3.1 g/dL (ref 1.5–4.5)
Glucose: 114 mg/dL — ABNORMAL HIGH (ref 65–99)
Potassium: 3.8 mmol/L (ref 3.5–5.2)
Sodium: 139 mmol/L (ref 134–144)
Total Protein: 7.8 g/dL (ref 6.0–8.5)

## 2020-05-26 LAB — CBC
Hematocrit: 49.8 % — ABNORMAL HIGH (ref 34.0–46.6)
Hemoglobin: 16 g/dL — ABNORMAL HIGH (ref 11.1–15.9)
MCH: 27.4 pg (ref 26.6–33.0)
MCHC: 32.1 g/dL (ref 31.5–35.7)
MCV: 85 fL (ref 79–97)
Platelets: 349 10*3/uL (ref 150–450)
RBC: 5.83 x10E6/uL — ABNORMAL HIGH (ref 3.77–5.28)
RDW: 13.1 % (ref 11.7–15.4)
WBC: 5 10*3/uL (ref 3.4–10.8)

## 2020-05-26 LAB — TSH: TSH: 4.32 u[IU]/mL (ref 0.450–4.500)

## 2020-05-28 ENCOUNTER — Other Ambulatory Visit: Payer: 59

## 2020-05-28 DIAGNOSIS — Z20822 Contact with and (suspected) exposure to covid-19: Secondary | ICD-10-CM

## 2020-05-29 LAB — SARS-COV-2, NAA 2 DAY TAT

## 2020-05-29 LAB — NOVEL CORONAVIRUS, NAA: SARS-CoV-2, NAA: DETECTED — AB

## 2020-05-31 NOTE — Telephone Encounter (Signed)
I have not seen any paperwork. Make sure we know exact dates, hours off, etc

## 2020-05-31 NOTE — Telephone Encounter (Signed)
Have you received the FMLA paperwork? We will need to attach the lab results.

## 2020-06-04 NOTE — Telephone Encounter (Signed)
I spoke to patient and she asked me to fax form to [employer] and mail her a copy.  I let patient know there will be a charge. Form faxed to [employer].

## 2020-06-04 NOTE — Telephone Encounter (Signed)
Form done and now signed Find out if she wants it faxed or mailed, etc

## 2020-06-04 NOTE — Telephone Encounter (Signed)
Attending Physician Form printed out with the test results and placed in Dr Alla German Inbox on his desk.

## 2020-06-12 ENCOUNTER — Other Ambulatory Visit: Payer: Self-pay

## 2020-06-12 ENCOUNTER — Other Ambulatory Visit (INDEPENDENT_AMBULATORY_CARE_PROVIDER_SITE_OTHER): Payer: 59

## 2020-06-12 DIAGNOSIS — R7309 Other abnormal glucose: Secondary | ICD-10-CM

## 2020-06-13 LAB — PARATHYROID HORMONE, INTACT (NO CA): PTH: 14 pg/mL — ABNORMAL LOW (ref 15–65)

## 2020-06-13 LAB — RENAL FUNCTION PANEL
Albumin: 4.5 g/dL (ref 3.8–4.9)
BUN/Creatinine Ratio: 16 (ref 9–23)
BUN: 15 mg/dL (ref 6–24)
CO2: 26 mmol/L (ref 20–29)
Calcium: 10.1 mg/dL (ref 8.7–10.2)
Chloride: 98 mmol/L (ref 96–106)
Creatinine, Ser: 0.91 mg/dL (ref 0.57–1.00)
GFR calc Af Amer: 84 mL/min/{1.73_m2} (ref >59)
GFR calc non Af Amer: 73 mL/min/{1.73_m2} (ref >59)
Glucose: 146 mg/dL — ABNORMAL HIGH (ref 65–99)
Phosphorus: 4.6 mg/dL — ABNORMAL HIGH (ref 3.0–4.3)
Potassium: 3.9 mmol/L (ref 3.5–5.2)
Sodium: 138 mmol/L (ref 134–144)

## 2020-06-13 LAB — HEMOGLOBIN A1C
Est. average glucose Bld gHb Est-mCnc: 186 mg/dL
Hgb A1c MFr Bld: 8.1 % — ABNORMAL HIGH (ref 4.8–5.6)

## 2020-06-26 ENCOUNTER — Ambulatory Visit: Payer: 59 | Admitting: Internal Medicine

## 2020-06-26 ENCOUNTER — Other Ambulatory Visit: Payer: Self-pay

## 2020-06-26 ENCOUNTER — Encounter: Payer: Self-pay | Admitting: Internal Medicine

## 2020-06-26 VITALS — BP 138/90 | HR 98 | Temp 96.7°F | Ht 67.0 in | Wt 223.0 lb

## 2020-06-26 DIAGNOSIS — E1159 Type 2 diabetes mellitus with other circulatory complications: Secondary | ICD-10-CM | POA: Insufficient documentation

## 2020-06-26 DIAGNOSIS — E119 Type 2 diabetes mellitus without complications: Secondary | ICD-10-CM

## 2020-06-26 MED ORDER — METFORMIN HCL ER 500 MG PO TB24: 1000.0000 mg | ORAL_TABLET | Freq: Every day | ORAL | 3 refills | Status: DC

## 2020-06-26 NOTE — Assessment & Plan Note (Signed)
Explained importance of good glycemic control Will set up with eye doctor for dilated exam Referral for counseling--though she is reluctant Will start metformin Discussed A1c goal under 7%

## 2020-06-26 NOTE — Progress Notes (Signed)
Subjective:    Patient ID: Kristina Gardner, female    DOB: 17-Mar-1971, 50 y.o.   MRN: [id number]  HPI Here to review new diagnosis of diabetes This visit occurred during the SARS-CoV-2 public health emergency.  Safety protocols were in place, including screening questions prior to the visit, additional usage of staff PPE, and extensive cleaning of exam room while observing appropriate contact time as indicated for disinfecting solutions.   She is somewhat resistant to diabetic counseling  Discussed the importance of this though  Current Outpatient Medications on File Prior to Visit  Medication Sig Dispense Refill  . diltiazem (DILT-XR) 180 MG 24 hr capsule Take 1 capsule (180 mg total) by mouth daily. 90 capsule 3  . ketoconazole (NIZORAL) 2 % cream Apply 1 application topically daily. 60 each 1  . levothyroxine (SYNTHROID) 100 MCG tablet TAKE 1 TABLET BY MOUTH  DAILY 90 tablet 3  . losartan-hydrochlorothiazide (HYZAAR) 100-25 MG tablet TAKE 1 TABLET BY MOUTH EVERY DAY 90 tablet 3  . montelukast (SINGULAIR) 10 MG tablet Take 1 tablet (10 mg total) by mouth at bedtime. 90 tablet 3  . omeprazole (PRILOSEC) 20 MG capsule Take 1 capsule (20 mg total) by mouth daily. 180 capsule 3   No current facility-administered medications on file prior to visit.    Allergies  Allergen Reactions  . Norvasc [Amlodipine Besylate] Other (See Comments)    dizziness    Past Medical History:  Diagnosis Date  . Dysrhythmia   . GERD (gastroesophageal reflux disease)   . Hypertension   . Leiomyoma of uterus, unspecified   . Obesity   . Pneumonia   . Unspecified hypothyroidism     Past Surgical History:  Procedure Laterality Date  . COLONOSCOPY WITH PROPOFOL N/A 08/15/2019   Procedure: COLONOSCOPY WITH PROPOFOL;  Surgeon: Jonathon Bellows, MD;  Location: [facility] ENDOSCOPY;  Service: Gastroenterology;  Laterality: N/A;  . COLONOSCOPY WITH PROPOFOL N/A 10/06/2019   Procedure: COLONOSCOPY WITH PROPOFOL;   Surgeon: Jonathon Bellows, MD;  Location[facility] ENDOSCOPY;  Service: Gastroenterology;  Laterality: N/A;  . DILATION AND CURETTAGE OF UTERUS  2000    Family History  Problem Relation Age of Onset  . Hypertension Mother   . Cancer Mother        Bone Marrow   . Kidney disease Mother   . Arthritis Sister   . Cancer Paternal Grandfather        lung cancer  . Cancer Maternal Aunt        from snuff  . Dementia Maternal Aunt   . Bladder Cancer Maternal Aunt   . Graves' disease Other        multiple    Social History   Socioeconomic History  . Marital status: Single    Spouse name: Not on file  . Number of children: 1  . Years of education: Not on file  . Highest education level: Not on file  Occupational History  . Occupation: [employer], Therapist, art  Tobacco Use  . Smoking status: Never Smoker  . Smokeless tobacco: Never Used  Substance and Sexual Activity  . Alcohol use: Yes    Comment: occasional  . Drug use: No  . Sexual activity: Yes    Partners: Male    Birth control/protection: Condom, I.U.D.  Other Topics Concern  . Not on file  Social History Narrative   1 daughter   Live in boyfriend since ~2008. He has 2 daughters   Social Determinants of Engineer, drilling  Resource Strain: Not on file  Food Insecurity: Not on file  Transportation Needs: Not on file  Physical Activity: Not on file  Stress: Not on file  Social Connections: Not on file  Intimate Partner Violence: Not on file   Review of Systems  She has cut out carbs already---pasta, bread Limits rice Has started walking a little with boyfriend     Objective:   Physical Exam         Assessment & Plan:

## 2020-06-26 NOTE — Patient Instructions (Signed)
Please start the metformin with 1 tab daily with breakfast. If you have no problems with this after a week, increase to 2 tabs with breakfast.

## 2020-07-13 ENCOUNTER — Encounter: Payer: Self-pay | Admitting: *Deleted

## 2020-07-13 ENCOUNTER — Encounter: Payer: 59 | Attending: Internal Medicine | Admitting: *Deleted

## 2020-07-13 ENCOUNTER — Other Ambulatory Visit: Payer: Self-pay

## 2020-07-13 VITALS — BP 130/96 | Ht 66.5 in | Wt 220.3 lb

## 2020-07-13 DIAGNOSIS — E119 Type 2 diabetes mellitus without complications: Secondary | ICD-10-CM

## 2020-07-13 NOTE — Progress Notes (Signed)
Diabetes Self-Management Education  Visit Type: First/Initial  Appt. Start Time: 1330 Appt. End Time: 2841  07/13/2020  Kristina Gardner, identified by name and date of birth, is a 50 y.o. female with a diagnosis of Diabetes: Type 2.   ASSESSMENT  Blood pressure (!) 130/96, height 5' 6.5" (1.689 m), weight 220 lb 4.8 oz (99.9 kg). Body mass index is 35.02 kg/m.   Diabetes Self-Management Education - 07/13/20 1509      Visit Information   Visit Type First/Initial      Initial Visit   Diabetes Type Type 2    Are you currently following a meal plan? Yes    What type of meal plan do you follow? "I cut out starches and only eat meats and veggies and no sweets"    Are you taking your medications as prescribed? Yes    Date Diagnosed 3 weeks      Health Coping   How would you rate your overall health? Fair      Psychosocial Assessment   Patient Belief/Attitude about Diabetes Other (comment)   "sad"   Self-care barriers None    Self-management support Doctor's office;Friends;Family    Patient Concerns Nutrition/Meal planning;Medication;Monitoring;Healthy Lifestyle;Problem Solving;Glycemic Control;Weight Control    Special Needs None    Preferred Learning Style Hands on;Auditory    Learning Readiness Change in progress    How often do you need to have someone help you when you read instructions, pamphlets, or other written materials from your doctor or pharmacy? 1 - Never    What is the last grade level you completed in school? 1 year college      Pre-Education Assessment   Patient understands the diabetes disease and treatment process. Needs Instruction    Patient understands incorporating nutritional management into lifestyle. Needs Instruction    Patient undertands incorporating physical activity into lifestyle. Needs Instruction    Patient understands using medications safely. Needs Instruction    Patient understands monitoring blood glucose, interpreting and using  results Needs Instruction    Patient understands prevention, detection, and treatment of acute complications. Needs Instruction    Patient understands prevention, detection, and treatment of chronic complications. Needs Instruction    Patient understands how to develop strategies to address psychosocial issues. Needs Instruction    Patient understands how to develop strategies to promote health/change behavior. Needs Instruction      Complications   Last HgB A1C per patient/outside source 8.1 %   06/12/2020   How often do you check your blood sugar? 0 times/day (not testing)   BG in the office was 102 mg/dL at 2:25 pm  - 2 hrs pp. She decided after I checked her blood sugar on the office meter that she wanted to start checking at home. Provided One Touch Verio Reflect and instructed on use.   Have you had a dilated eye exam in the past 12 months? No   has appt in April   Have you had a dental exam in the past 12 months? Yes    Are you checking your feet? Yes    How many days per week are you checking your feet? 7      Dietary Intake   Breakfast steel cut oats with almond milk and apples, carnberries and honey; sweet potatoe; smoothies    Snack (morning) reports eating no snacks    Lunch grilled chicken salad every day from different places - lettuce, chicken, tomatoes, onions, olives, egg, cheese, cuccumbers, Ranch dressing  Dinner chicken, salmon; broccoli, asparagus, green beans, cauliflower, collards, lentils    Beverage(s) water, diet soda      Exercise   Exercise Type Light (walking / raking leaves)    How many days per week to you exercise? 2    How many minutes per day do you exercise? 30    Total minutes per week of exercise 60      Patient Education   Previous Diabetes Education No    Disease state  Definition of diabetes, type 1 and 2, and the diagnosis of diabetes;Factors that contribute to the development of diabetes    Nutrition management  Role of diet in the treatment  of diabetes and the relationship between the three main macronutrients and blood glucose level;Food label reading, portion sizes and measuring food.;Carbohydrate counting;Reviewed blood glucose goals for pre and post meals and how to evaluate the patients' food intake on their blood glucose level.    Physical activity and exercise  Role of exercise on diabetes management, blood pressure control and cardiac health.    Medications Reviewed patients medication for diabetes, action, purpose, timing of dose and side effects.    Monitoring Taught/evaluated SMBG meter.;Purpose and frequency of SMBG.;Taught/discussed recording of test results and interpretation of SMBG.;Identified appropriate SMBG and/or A1C goals.    Chronic complications Relationship between chronic complications and blood glucose control    Psychosocial adjustment Identified and addressed patients feelings and concerns about diabetes      Individualized Goals (developed by patient)   Reducing Risk Other (comment)   improve blood sugars, decrease medications, prevent diabetes complications, lose weight, lead a healthier lifestyle, becomre more fit     Outcomes   Expected Outcomes Demonstrated interest in learning. Expect positive outcomes    Future DMSE 4-6 wks           Individualized Plan for Diabetes Self-Management Training:   Learning Objective:  Patient will have a greater understanding of diabetes self-management. Patient education plan is to attend individual and/or group sessions per assessed needs and concerns.   Plan:   Patient Instructions  Check blood sugars2 x day before breakfast and 2 hrs after one meal - 3 x week Bring blood sugar records to the next appointment  Call your doctor for a prescription for:  1. Meter strips (type) One Touch Verio  checking  3    times per week  2. Lancets (type) One Touch Delica Plus checking  3    times per week  Exercise: Continue walking for  30  minutes 2 days a week and  gradually increase to 30 minutes 5 x week  Eat 3 meals day, 1-2  snacks a day Space meals 4-6 hours apart Include at least 1 carbohydrate serving with meals  Complete 3 Day Food Record and bring to next appt  Return for appointment on:  Monday August 20, 2020 at 3:30 pm with [facility] - dietitian  Expected Outcomes:  Demonstrated interest in learning. Expect positive outcomes  Education material provided:  General Meal Planning Guidelines Simple Meal Plan Meter - One Touch Verio Reflect 3 Day Food Record Smart Snacking Carb-Mindful Smoothie Handout  If problems or questions, patient to contact team via:  Johny Drilling, RN, [place] [phone]  Future DSME appointment: 4-6 wks  August 20, 2020 with the dietitian

## 2020-07-13 NOTE — Patient Instructions (Addendum)
Check blood sugars2 x day before breakfast and 2 hrs after one meal - 3 x week Bring blood sugar records to the next appointment  Call your doctor for a prescription for:  1. Meter strips (type) One Touch Verio  checking  3    times per week  2. Lancets (type) One Touch Delica Plus checking  3    times per week  Exercise: Continue walking for  30  minutes 2 days a week and gradually increase to 30 minutes 5 x week  Eat 3 meals day, 1-2  snacks a day Space meals 4-6 hours apart Include at least 1 carbohydrate serving with meals  Complete 3 Day Food Record and bring to next appt  Return for appointment on:  Monday August 20, 2020 at 3:30 pm with [facility] - dietitian

## 2020-07-17 MED ORDER — ONETOUCH VERIO VI STRP: ORAL_STRIP | 4 refills | Status: AC

## 2020-07-18 ENCOUNTER — Encounter: Payer: Self-pay | Admitting: Internal Medicine

## 2020-07-26 ENCOUNTER — Other Ambulatory Visit: Payer: Self-pay | Admitting: Internal Medicine

## 2020-08-07 LAB — HM DIABETES EYE EXAM

## 2020-08-20 ENCOUNTER — Other Ambulatory Visit: Payer: Self-pay

## 2020-08-20 ENCOUNTER — Encounter: Payer: Self-pay | Admitting: Dietician

## 2020-08-20 ENCOUNTER — Encounter: Payer: 59 | Attending: Internal Medicine | Admitting: Dietician

## 2020-08-20 VITALS — BP 160/120 | Ht 66.5 in | Wt 213.7 lb

## 2020-08-20 DIAGNOSIS — E119 Type 2 diabetes mellitus without complications: Secondary | ICD-10-CM | POA: Diagnosis not present

## 2020-08-20 NOTE — Patient Instructions (Signed)
   Aim for 30-45grams of carb and average of 400 calories with each meal, and 15-25g carb and 100-150 calwith each snack (if one per day)  Check "total carbohydrate" grams on food labels; you can subtract fiber grams from the total.   Keep up regular exercise as much as possible to help with blood sugar control and blood pressure.   Check blood sugars 2x a day, fasting and after one meal; 3 days a week.

## 2020-08-20 NOTE — Progress Notes (Signed)
Diabetes Self-Management Education  Visit Type:  Follow-up  Appt. Start Time: 1530 Appt. End Time: 1630  08/20/2020  Kristina Gardner, identified by name and date of birth, is a 50 y.o. female with a diagnosis of Diabetes:  Type 2   ASSESSMENT  Blood pressure (!) 160/120, height 5' 6.5" (1.689 m), weight 213 lb 11.2 oz (96.9 kg). Body mass index is 33.98 kg/m.  Advised patient to retest BP at home; she missed taking medications yesterday but did take them today.     Diabetes Self-Management Education - 58/52/77 8242      Complications   How often do you check your blood sugar? 3-4 times / week    Fasting Blood glucose range (mg/dL) 130-179   131-140 + one reading of 198   Postprandial Blood glucose range (mg/dL) 130-179   130s   Number of hypoglycemic episodes per month 0    Have you had a dilated eye exam in the past 12 months? Yes    Have you had a dental exam in the past 12 months? Yes   next appt 12/2020   Are you checking your feet? Yes    How many days per week are you checking your feet? 7      Dietary Intake   Breakfast yogurt; boiled egg or scxrambled with avocado; mango smoothie; granola    Snack (morning) almonds or pistachios; coffee    Lunch salad with grilled chicken; sandwich with 1-2 slices bread;occasional cheeseburger, grilled onions, mayo, 1/2 bun    Dinner grilled chicken or fish + green veg ie green beans, broccoli, brussels sprouts    Beverage(s) water; diet soda; coffee maybe twice a week      Exercise   Exercise Type Light (walking / raking leaves)    How many days per week to you exercise? 2    How many minutes per day do you exercise? 30    Total minutes per week of exercise 60      Patient Education   Nutrition management  Food label reading, portion sizes and measuring food.;Meal options for control of blood glucose level and chronic complications.;Role of diet in the treatment of diabetes and the relationship between the three main  macronutrients and blood glucose level;Other (comment)   resources for checking nutritional facts for foods, tracking food intake   Physical activity and exercise  Role of exercise on diabetes management, blood pressure control and cardiac health.    Monitoring Purpose and frequency of SMBG.;Taught/discussed recording of test results and interpretation of SMBG.    Psychosocial adjustment Role of stress on diabetes      Post-Education Assessment   Patient understands the diabetes disease and treatment process. Demonstrates understanding / competency    Patient understands incorporating nutritional management into lifestyle. Demonstrates understanding / competency    Patient undertands incorporating physical activity into lifestyle. Demonstrates understanding / competency    Patient understands using medications safely. Demonstrates understanding / competency    Patient understands monitoring blood glucose, interpreting and using results Demonstrates understanding / competency    Patient understands prevention, detection, and treatment of acute complications. Needs Review    Patient understands prevention, detection, and treatment of chronic complications. Demonstrates understanding / competency    Patient understands how to develop strategies to address psychosocial issues. Demonstrates understanding / competency    Patient understands how to develop strategies to promote health/change behavior. Demonstrates understanding / competency      Outcomes   Program Status Completed  Learning Objective:  Patient will have a greater understanding of diabetes self-management. Patient education plan is to attend individual and/or group sessions per assessed needs and concerns.  Other notes:   Patient reports high stress level due to busy schedule; she is currently caring for her sister who was recently hospitalized.  Patient reports abdominal pain which she feels is due to Metformin  (gradually improved until she missed some doses and then restarted meds and now pain has returned)  Patient reports poor appetite for the past several weeks; she is making herself eat 3 meals and 1 snack daily, but sometimes does miss a meal when she is busy.   Patient reports ongoing anxiety when trying to perform fingerprick BG tests. She reports checking multiple times in one day.    Plan:   Patient Instructions   Aim for 30-45grams of carb and average of 400 calories with each meal, and 15-25g carb and 100-150 calwith each snack (if one per day)  Check "total carbohydrate" grams on food labels; you can subtract fiber grams from the total.   Keep up regular exercise as much as possible to help with blood sugar control and blood pressure.   Check blood sugars 2x a day, fasting and after one meal; 3 days a week.    Expected Outcomes:  Demonstrated interest in learning. Expect positive outcomes  Education material provided: Quick and Simple Meal Ideas; Smart Snacking  If problems or questions, patient to contact team via:  Phone and Email

## 2020-09-11 ENCOUNTER — Ambulatory Visit: Payer: 59 | Admitting: Internal Medicine

## 2020-09-11 ENCOUNTER — Other Ambulatory Visit: Payer: Self-pay

## 2020-09-11 ENCOUNTER — Encounter: Payer: Self-pay | Admitting: Internal Medicine

## 2020-09-11 VITALS — BP 118/78 | HR 87 | Temp 97.1°F | Ht 67.0 in | Wt 210.0 lb

## 2020-09-11 DIAGNOSIS — M79606 Pain in leg, unspecified: Secondary | ICD-10-CM | POA: Diagnosis not present

## 2020-09-11 DIAGNOSIS — I10 Essential (primary) hypertension: Secondary | ICD-10-CM | POA: Diagnosis not present

## 2020-09-11 DIAGNOSIS — E119 Type 2 diabetes mellitus without complications: Secondary | ICD-10-CM

## 2020-09-11 DIAGNOSIS — E1159 Type 2 diabetes mellitus with other circulatory complications: Secondary | ICD-10-CM | POA: Diagnosis not present

## 2020-09-11 LAB — HM DIABETES FOOT EXAM

## 2020-09-11 LAB — POCT GLYCOSYLATED HEMOGLOBIN (HGB A1C): Hemoglobin A1C: 6.3 % — AB (ref 4.0–5.6)

## 2020-09-11 NOTE — Progress Notes (Signed)
Subjective:    Patient ID: Kristina Gardner, female    DOB: 10-10-1970, 50 y.o.   MRN: [id number]  HPI Here for follow up of diabetes This visit occurred during the SARS-CoV-2 public health emergency.  Safety protocols were in place, including screening questions prior to the visit, additional usage of staff PPE, and extensive cleaning of exam room while observing appropriate contact time as indicated for disinfecting solutions.   Did go for diabetic counseling Has made some changes---lost 13# Was checking sugars---but finds it hard Had been 130 or so  Noted her "insides hurt" at first with metformin Has gotten more used to that  Also with some leg aching--almost all the time Stiff when sitting--then pain along posterior hips/thighs Then aches Doesn't get better with exercise  Current Outpatient Medications on File Prior to Visit  Medication Sig Dispense Refill  . diltiazem (DILT-XR) 180 MG 24 hr capsule Take 1 capsule (180 mg total) by mouth daily. 90 capsule 3  . glucose blood (ONETOUCH VERIO) test strip Use to check blood sugar once a day 100 each 4  . ketoconazole (NIZORAL) 2 % cream Apply 1 application topically daily. 60 each 1  . levothyroxine (SYNTHROID) 100 MCG tablet TAKE 1 TABLET BY MOUTH  DAILY 90 tablet 3  . losartan-hydrochlorothiazide (HYZAAR) 100-25 MG tablet TAKE 1 TABLET BY MOUTH EVERY DAY 90 tablet 3  . metFORMIN (GLUCOPHAGE-XR) 500 MG 24 hr tablet Take 2 tablets (1,000 mg total) by mouth daily with breakfast. 180 tablet 3  . omeprazole (PRILOSEC) 20 MG capsule TAKE 1 CAPSULE(20 MG) BY MOUTH DAILY 180 capsule 3  . montelukast (SINGULAIR) 10 MG tablet Take 1 tablet (10 mg total) by mouth at bedtime. (Patient not taking: Reported on 09/11/2020) 90 tablet 3   No current facility-administered medications on file prior to visit.    Allergies  Allergen Reactions  . Norvasc [Amlodipine Besylate] Other (See Comments)    dizziness    Past Medical History:   Diagnosis Date  . Diabetes mellitus without complication ([place])   . Dysrhythmia   . GERD (gastroesophageal reflux disease)   . Hypertension   . Leiomyoma of uterus, unspecified   . Obesity   . Pneumonia   . Unspecified hypothyroidism     Past Surgical History:  Procedure Laterality Date  . COLONOSCOPY WITH PROPOFOL N/A 08/15/2019   Procedure: COLONOSCOPY WITH PROPOFOL;  Surgeon: Jonathon Bellows, MD;  Location: [facility] ENDOSCOPY;  Service: Gastroenterology;  Laterality: N/A;  . COLONOSCOPY WITH PROPOFOL N/A 10/06/2019   Procedure: COLONOSCOPY WITH PROPOFOL;  Surgeon: Jonathon Bellows, MD;  Location: [facility] ENDOSCOPY;  Service: Gastroenterology;  Laterality: N/A;  . DILATION AND CURETTAGE OF UTERUS  2000    Family History  Problem Relation Age of Onset  . Hypertension Mother   . Cancer Mother        Bone Marrow   . Kidney disease Mother   . Arthritis Sister   . Cancer Paternal Grandfather        lung cancer  . Diabetes Father   . Cancer Maternal Aunt        from snuff  . Dementia Maternal Aunt   . Bladder Cancer Maternal Aunt   . Graves' disease Other        multiple    Social History   Socioeconomic History  . Marital status: Single    Spouse name: Not on file  . Number of children: 1  . Years of education: Not on file  . Highest  education level: Not on file  Occupational History  . Occupation: [employer], Therapist, art  Tobacco Use  . Smoking status: Never Smoker  . Smokeless tobacco: Never Used  Substance and Sexual Activity  . Alcohol use: Yes    Comment: occasional  . Drug use: No  . Sexual activity: Yes    Partners: Male    Birth control/protection: Condom, I.U.D.  Other Topics Concern  . Not on file  Social History Narrative   1 daughter   Live in boyfriend since ~2008. He has 2 daughters   Social Determinants of Radio broadcast assistant Strain: Not on file  Food Insecurity: Not on file  Transportation Needs: Not on file  Physical Activity: Not on file   Stress: Not on file  Social Connections: Not on file  Intimate Partner Violence: Not on file   Review of Systems  No shoulder problems Does exercise occasionally     Objective:   Physical Exam Constitutional:      Appearance: Normal appearance.  Cardiovascular:     Rate and Rhythm: Normal rate and regular rhythm.     Pulses: Normal pulses.     Heart sounds: No murmur heard. No gallop.   Pulmonary:     Effort: Pulmonary effort is normal.     Breath sounds: Normal breath sounds. No wheezing or rales.  Musculoskeletal:     Cervical back: Neck supple.     Comments: Normal ROM in hips  Lymphadenopathy:     Cervical: No cervical adenopathy.  Skin:    Comments: No foot lesions  Neurological:     Mental Status: She is alert.     Comments: Normal sensation in feet            Assessment & Plan:

## 2020-09-11 NOTE — Assessment & Plan Note (Signed)
BP Readings from Last 3 Encounters:  09/11/20 118/78  08/20/20 (!) 160/120  07/13/20 (!) 130/96   Good control on the losartan HCTZ

## 2020-09-11 NOTE — Assessment & Plan Note (Signed)
Lab Results  Component Value Date   HGBA1C 6.3 (A) 09/11/2020   Great improvement with lifestyle and metformin Reassured--on the right track

## 2020-09-11 NOTE — Assessment & Plan Note (Signed)
Aching Non specific Not neuropathy----no sig back or hip symptoms Discussed fitness regimen---if not improving, would set up with Dr Lorelei Pont

## 2020-10-05 ENCOUNTER — Other Ambulatory Visit: Payer: Self-pay | Admitting: *Deleted

## 2020-10-05 MED ORDER — METRONIDAZOLE 500 MG PO TABS: 500.0000 mg | ORAL_TABLET | Freq: Two times a day (BID) | ORAL | 0 refills | Status: DC

## 2020-12-18 ENCOUNTER — Other Ambulatory Visit: Payer: Self-pay | Admitting: *Deleted

## 2020-12-18 MED ORDER — FLUCONAZOLE 150 MG PO TABS: 150.0000 mg | ORAL_TABLET | Freq: Once | ORAL | 3 refills | Status: AC

## 2021-02-12 ENCOUNTER — Other Ambulatory Visit: Payer: Self-pay | Admitting: Internal Medicine

## 2021-03-09 DIAGNOSIS — N898 Other specified noninflammatory disorders of vagina: Secondary | ICD-10-CM

## 2021-03-11 MED ORDER — FLUCONAZOLE 150 MG PO TABS: 150.0000 mg | ORAL_TABLET | Freq: Once | ORAL | 0 refills | Status: AC

## 2021-03-14 ENCOUNTER — Encounter: Payer: Self-pay | Admitting: Radiology

## 2021-04-04 ENCOUNTER — Other Ambulatory Visit: Payer: Self-pay | Admitting: Family Medicine

## 2021-04-04 MED ORDER — METRONIDAZOLE 500 MG PO TABS: 500.0000 mg | ORAL_TABLET | Freq: Two times a day (BID) | ORAL | 0 refills | Status: DC

## 2021-04-12 ENCOUNTER — Other Ambulatory Visit: Payer: Self-pay | Admitting: Internal Medicine

## 2021-05-02 ENCOUNTER — Encounter: Payer: Self-pay | Admitting: Internal Medicine

## 2021-05-14 ENCOUNTER — Other Ambulatory Visit: Payer: Self-pay

## 2021-05-14 ENCOUNTER — Encounter: Payer: Self-pay | Admitting: Internal Medicine

## 2021-05-14 ENCOUNTER — Ambulatory Visit: Payer: 59 | Admitting: Internal Medicine

## 2021-05-14 VITALS — BP 116/86 | HR 93 | Temp 97.0°F | Ht 67.0 in | Wt 197.0 lb

## 2021-05-14 DIAGNOSIS — E039 Hypothyroidism, unspecified: Secondary | ICD-10-CM | POA: Diagnosis not present

## 2021-05-14 DIAGNOSIS — N951 Menopausal and female climacteric states: Secondary | ICD-10-CM

## 2021-05-14 DIAGNOSIS — E1159 Type 2 diabetes mellitus with other circulatory complications: Secondary | ICD-10-CM

## 2021-05-14 DIAGNOSIS — M546 Pain in thoracic spine: Secondary | ICD-10-CM

## 2021-05-14 LAB — COMPREHENSIVE METABOLIC PANEL
ALT: 23 U/L (ref 0–35)
AST: 15 U/L (ref 0–37)
Albumin: 4.6 g/dL (ref 3.5–5.2)
Alkaline Phosphatase: 158 U/L — ABNORMAL HIGH (ref 39–117)
BUN: 19 mg/dL (ref 6–23)
CO2: 26 mEq/L (ref 19–32)
Calcium: 10.3 mg/dL (ref 8.4–10.5)
Chloride: 99 mEq/L (ref 96–112)
Creatinine, Ser: 0.83 mg/dL (ref 0.40–1.20)
GFR: 80.75 mL/min (ref >60.00)
Glucose, Bld: 96 mg/dL (ref 70–99)
Potassium: 3.8 mEq/L (ref 3.5–5.1)
Sodium: 137 mEq/L (ref 135–145)
Total Bilirubin: 0.6 mg/dL (ref 0.2–1.2)
Total Protein: 8 g/dL (ref 6.0–8.3)

## 2021-05-14 LAB — CBC
HCT: 46.9 % — ABNORMAL HIGH (ref 36.0–46.0)
Hemoglobin: 15 g/dL (ref 12.0–15.0)
MCHC: 32.1 g/dL (ref 30.0–36.0)
MCV: 86.5 fl (ref 78.0–100.0)
Platelets: 335 10*3/uL (ref 150.0–400.0)
RBC: 5.42 Mil/uL — ABNORMAL HIGH (ref 3.87–5.11)
RDW: 14.1 % (ref 11.5–15.5)
WBC: 4.4 10*3/uL (ref 4.0–10.5)

## 2021-05-14 LAB — LIPID PANEL
Cholesterol: 264 mg/dL — ABNORMAL HIGH (ref 0–200)
HDL: 65.4 mg/dL (ref >39.00)
LDL Cholesterol: 177 mg/dL — ABNORMAL HIGH (ref 0–99)
NonHDL: 198.72
Total CHOL/HDL Ratio: 4
Triglycerides: 110 mg/dL (ref 0.0–149.0)
VLDL: 22 mg/dL (ref 0.0–40.0)

## 2021-05-14 LAB — HEMOGLOBIN A1C: Hgb A1c MFr Bld: 6.7 % — ABNORMAL HIGH (ref 4.6–6.5)

## 2021-05-14 LAB — T4, FREE: Free T4: 1.18 ng/dL (ref 0.60–1.60)

## 2021-05-14 LAB — TSH: TSH: 1.89 u[IU]/mL (ref 0.35–5.50)

## 2021-05-14 MED ORDER — GABAPENTIN 100 MG PO CAPS: 100.0000 mg | ORAL_CAPSULE | Freq: Every day | ORAL | 3 refills | Status: DC

## 2021-05-14 MED ORDER — TIZANIDINE HCL 2 MG PO TABS: 2.0000 mg | ORAL_TABLET | Freq: Every evening | ORAL | 0 refills | Status: DC | PRN

## 2021-05-14 NOTE — Progress Notes (Signed)
Subjective:    Patient ID: STEFFIE WAGGONER, female    DOB: 1971/03/03, 51 y.o.   MRN: [id number]  HPI Here due to back spasms  Boyfriend moved out in early December Was cleaning out bathroom and moving furniture Noticed her back was sore after Then started having "grabbing" in right low back (upper lumbar)  Notices it driving--moving right arm or leg Tried salon pas pads, doans pills Heat helped a little --and heated seats in car also felt good Only thing that helped was advil---taking 6 at a time (once a day and not every day) Typically will last for a few minutes---then resolves Was hitting her in bed---but this is better now (occasional twinge)  Also not sleeping Hot flashes and sweats Thinks she needs help  Not sure about her diabetes control--not checking Still on the metformin 1000mg  with breakfast  Current Outpatient Medications on File Prior to Visit  Medication Sig Dispense Refill   DILT-XR 180 MG 24 hr capsule TAKE 1 CAPSULE BY MOUTH  DAILY 90 capsule 3   glucose blood (ONETOUCH VERIO) test strip Use to check blood sugar once a day 100 each 4   ketoconazole (NIZORAL) 2 % cream Apply 1 application topically daily. 60 each 1   levothyroxine (SYNTHROID) 100 MCG tablet TAKE 1 TABLET BY MOUTH  DAILY 90 tablet 3   losartan-hydrochlorothiazide (HYZAAR) 100-25 MG tablet TAKE 1 TABLET BY MOUTH EVERY DAY 90 tablet 3   metFORMIN (GLUCOPHAGE-XR) 500 MG 24 hr tablet Take 2 tablets (1,000 mg total) by mouth daily with breakfast. 180 tablet 3   omeprazole (PRILOSEC) 20 MG capsule TAKE 1 CAPSULE(20 MG) BY MOUTH DAILY 180 capsule 3   montelukast (SINGULAIR) 10 MG tablet Take 1 tablet (10 mg total) by mouth at bedtime. (Patient not taking: Reported on 09/11/2020) 90 tablet 3   No current facility-administered medications on file prior to visit.    Allergies  Allergen Reactions   Norvasc [Amlodipine Besylate] Other (See Comments)    dizziness    Past Medical History:   Diagnosis Date   Diabetes mellitus without complication (HCC)    Dysrhythmia    GERD (gastroesophageal reflux disease)    Hypertension    Leiomyoma of uterus, unspecified    Obesity    Pneumonia    Unspecified hypothyroidism     Past Surgical History:  Procedure Laterality Date   COLONOSCOPY WITH PROPOFOL N/A 08/15/2019   Procedure: COLONOSCOPY WITH PROPOFOL;  Surgeon: Jonathon Bellows, MD;  Location: [facility] ENDOSCOPY;  Service: Gastroenterology;  Laterality: N/A;   COLONOSCOPY WITH PROPOFOL N/A 10/06/2019   Procedure: COLONOSCOPY WITH PROPOFOL;  Surgeon: Jonathon Bellows, MD;  Location: [facility] ENDOSCOPY;  Service: Gastroenterology;  Laterality: N/A;   DILATION AND CURETTAGE OF UTERUS  2000    Family History  Problem Relation Age of Onset   Hypertension Mother    Cancer Mother        Bone Marrow    Kidney disease Mother    Arthritis Sister    Cancer Paternal Grandfather        lung cancer   Diabetes Father    Cancer Maternal Aunt        from snuff   Dementia Maternal Aunt    Bladder Cancer Maternal Aunt    Graves' disease Other        multiple    Social History   Socioeconomic History   Marital status: Single    Spouse name: Not on file   Number of children: 1  Years of education: Not on file   Highest education level: Not on file  Occupational History   Occupation: [employer], customer service  Tobacco Use   Smoking status: Never   Smokeless tobacco: Never  Substance and Sexual Activity   Alcohol use: Yes    Comment: occasional   Drug use: No   Sexual activity: Yes    Partners: Male    Birth control/protection: Condom, I.U.D.  Other Topics Concern   Not on file  Social History Narrative   1 daughter   Live in boyfriend since ~2008. He has 2 daughters   Social Determinants of Radio broadcast assistant Strain: Not on file  Food Insecurity: Not on file  Transportation Needs: Not on file  Physical Activity: Not on file  Stress: Not on file  Social Connections: Not  on file  Intimate Partner Violence: Not on file   Review of Systems No radiation to legs No leg weakness No change in bowel or bladder function      Objective:   Physical Exam Constitutional:      Appearance: Normal appearance.  Musculoskeletal:     Comments: Pain area is actually lower thoracic back on right Normal ROM in hips SLR negative  Neurological:     Mental Status: She is alert.     Motor: No weakness.     Gait: Gait normal.           Assessment & Plan:

## 2021-05-14 NOTE — Assessment & Plan Note (Signed)
Mostly better now Clear cut spasms Discussed heat, ibuprofen Will give some tizanidine--just in case (2-4 mg at bedtime prn)

## 2021-05-14 NOTE — Assessment & Plan Note (Signed)
Will check a1C to be sure her control is okay on metformin 1000mg  daily

## 2021-05-14 NOTE — Patient Instructions (Signed)
You can use the muscle relaxer as needed for the spasm (just at bedtime). The gabapentin is for the night sweats. Try just 1 capsule (100mg ) at bedtime for 2-3 nights. If no help, but no problems with it, go up to 2 capsules. You can also increase to 3 capsules if needed.

## 2021-05-14 NOTE — Assessment & Plan Note (Signed)
Will try gabapentin 100mg  at bedtime---then titrate up as needed

## 2021-05-20 ENCOUNTER — Ambulatory Visit: Payer: 59 | Admitting: Internal Medicine

## 2021-05-28 ENCOUNTER — Other Ambulatory Visit: Payer: Self-pay

## 2021-05-28 ENCOUNTER — Ambulatory Visit (INDEPENDENT_AMBULATORY_CARE_PROVIDER_SITE_OTHER): Payer: 59 | Admitting: Internal Medicine

## 2021-05-28 ENCOUNTER — Encounter: Payer: Self-pay | Admitting: Internal Medicine

## 2021-05-28 VITALS — BP 118/88 | HR 99 | Temp 97.1°F | Ht 66.75 in | Wt 195.0 lb

## 2021-05-28 DIAGNOSIS — N951 Menopausal and female climacteric states: Secondary | ICD-10-CM

## 2021-05-28 DIAGNOSIS — E1159 Type 2 diabetes mellitus with other circulatory complications: Secondary | ICD-10-CM

## 2021-05-28 DIAGNOSIS — Z23 Encounter for immunization: Secondary | ICD-10-CM

## 2021-05-28 DIAGNOSIS — I10 Essential (primary) hypertension: Secondary | ICD-10-CM

## 2021-05-28 DIAGNOSIS — Z0000 Encounter for general adult medical examination without abnormal findings: Secondary | ICD-10-CM

## 2021-05-28 LAB — HM DIABETES FOOT EXAM

## 2021-05-28 NOTE — Assessment & Plan Note (Signed)
Still some trouble initiating sleep but gabapentin helps her maintain sleep

## 2021-05-28 NOTE — Assessment & Plan Note (Signed)
Lab Results  Component Value Date   HGBA1C 6.7 (H) 05/14/2021   Doing well with metformin 1000mg  daily

## 2021-05-28 NOTE — Progress Notes (Signed)
Subjective:    Patient ID: Kristina Gardner, female    DOB: 1970-12-30, 51 y.o.   MRN: [id number]  HPI Here for physical  Back is better Hasn't needed the muscle relaxer  Had called paramedics one morning--woke with pain along middle of abdomen--middle of the night (early December?) Was severe and couldn't relieve the pain Slight nausea, tried walking around. Couldn't move bowels Didn't go to hospital---pain had resolved Nothing found by them  Checks sugars occasionally (doesn't like pricking finger) Has been good--132 today No foot burning---occasional tingling  Current Outpatient Medications on File Prior to Visit  Medication Sig Dispense Refill   DILT-XR 180 MG 24 hr capsule TAKE 1 CAPSULE BY MOUTH  DAILY 90 capsule 3   gabapentin (NEURONTIN) 100 MG capsule Take 1-3 capsules (100-300 mg total) by mouth at bedtime. 90 capsule 3   glucose blood (ONETOUCH VERIO) test strip Use to check blood sugar once a day 100 each 4   ketoconazole (NIZORAL) 2 % cream Apply 1 application topically daily. 60 each 1   levothyroxine (SYNTHROID) 100 MCG tablet TAKE 1 TABLET BY MOUTH  DAILY 90 tablet 3   losartan-hydrochlorothiazide (HYZAAR) 100-25 MG tablet TAKE 1 TABLET BY MOUTH EVERY DAY 90 tablet 3   metFORMIN (GLUCOPHAGE-XR) 500 MG 24 hr tablet Take 2 tablets (1,000 mg total) by mouth daily with breakfast. 180 tablet 3   montelukast (SINGULAIR) 10 MG tablet Take 1 tablet (10 mg total) by mouth at bedtime. 90 tablet 3   omeprazole (PRILOSEC) 20 MG capsule TAKE 1 CAPSULE(20 MG) BY MOUTH DAILY 180 capsule 3   tiZANidine (ZANAFLEX) 2 MG tablet Take 1-2 tablets (2-4 mg total) by mouth at bedtime as needed for muscle spasms. 60 tablet 0   No current facility-administered medications on file prior to visit.    Allergies  Allergen Reactions   Norvasc [Amlodipine Besylate] Other (See Comments)    dizziness    Past Medical History:  Diagnosis Date   Diabetes mellitus without complication  (HCC)    Dysrhythmia    GERD (gastroesophageal reflux disease)    Hypertension    Leiomyoma of uterus, unspecified    Obesity    Pneumonia    Unspecified hypothyroidism     Past Surgical History:  Procedure Laterality Date   COLONOSCOPY WITH PROPOFOL N/A 08/15/2019   Procedure: COLONOSCOPY WITH PROPOFOL;  Surgeon: Jonathon Bellows, MD;  Location: [facility] ENDOSCOPY;  Service: Gastroenterology;  Laterality: N/A;   COLONOSCOPY WITH PROPOFOL N/A 10/06/2019   Procedure: COLONOSCOPY WITH PROPOFOL;  Surgeon: Jonathon Bellows, MD;  Location: [facility] ENDOSCOPY;  Service: Gastroenterology;  Laterality: N/A;   DILATION AND CURETTAGE OF UTERUS  2000    Family History  Problem Relation Age of Onset   Hypertension Mother    Cancer Mother        Bone Marrow    Kidney disease Mother    Arthritis Sister    Cancer Paternal Grandfather        lung cancer   Diabetes Father    Cancer Maternal Aunt        from snuff   Dementia Maternal Aunt    Bladder Cancer Maternal Aunt    Graves' disease Other        multiple      Review of Systems  Constitutional:        Has lost ~20# since diabetes diagnosis Not really exercising Wears seat belt  HENT:  Negative for dental problem, hearing loss and tinnitus.  Keeps up with dentist  Eyes:        Vision okay Wakes with dry eyes  Respiratory:  Negative for cough, chest tightness and shortness of breath.   Cardiovascular:  Negative for chest pain and leg swelling.       Some palpitations with anxiety  Gastrointestinal:  Negative for blood in stool and constipation.       No heartburn on omeprazole  Endocrine: Negative for polydipsia and polyuria.  Genitourinary:  Negative for dysuria and hematuria.       No sex--no problem  Musculoskeletal:  Negative for arthralgias and joint swelling.  Skin:  Negative for rash.       Couple of cysts  Allergic/Immunologic: Negative for environmental allergies and immunocompromised state.  Neurological:  Positive for  dizziness. Negative for syncope, light-headedness and headaches.  Hematological:  Negative for adenopathy. Does not bruise/bleed easily.  Psychiatric/Behavioral:  Positive for sleep disturbance. Negative for dysphoric mood.        Occ anxiety spells---worries about money (now that she is by herself)      Objective:   Physical Exam Constitutional:      Appearance: Normal appearance.  HENT:     Mouth/Throat:     Pharynx: No oropharyngeal exudate or posterior oropharyngeal erythema.  Eyes:     Conjunctiva/sclera: Conjunctivae normal.     Pupils: Pupils are equal, round, and reactive to light.  Cardiovascular:     Rate and Rhythm: Normal rate and regular rhythm.     Pulses: Normal pulses.     Heart sounds: No murmur heard.   No gallop.  Pulmonary:     Effort: Pulmonary effort is normal.     Breath sounds: Normal breath sounds. No wheezing or rales.  Abdominal:     Palpations: Abdomen is soft.     Tenderness: There is no abdominal tenderness.  Musculoskeletal:     Cervical back: Neck supple.     Right lower leg: No edema.     Left lower leg: No edema.  Lymphadenopathy:     Cervical: No cervical adenopathy.  Skin:    Findings: No rash.     Comments: Small cyst posterior right lower thigh  Neurological:     General: No focal deficit present.     Mental Status: She is alert and oriented to person, place, and time.     Comments: Normal sensation in feet  Psychiatric:        Mood and Affect: Mood normal.        Behavior: Behavior normal.           Assessment & Plan:

## 2021-05-28 NOTE — Addendum Note (Signed)
Addended by: Pilar Grammes on: 05/28/2021 10:21 AM   Modules accepted: Orders

## 2021-05-28 NOTE — Assessment & Plan Note (Addendum)
Generally healthy Colon due 2024 Yearly mammogram--due next month Pap per gyn Discussed exercise Will do shingrix Had bivalent COVID--prefers no flu vaccine  Will recheck alkaline phos next year--slightly elevated

## 2021-05-28 NOTE — Assessment & Plan Note (Signed)
BP Readings from Last 3 Encounters:  05/28/21 118/88  05/14/21 116/86  09/11/20 118/78   Good control on losartan HCTZ

## 2021-06-05 ENCOUNTER — Other Ambulatory Visit: Payer: Self-pay | Admitting: Internal Medicine

## 2021-06-30 ENCOUNTER — Other Ambulatory Visit: Payer: Self-pay | Admitting: Internal Medicine

## 2021-07-01 ENCOUNTER — Ambulatory Visit (INDEPENDENT_AMBULATORY_CARE_PROVIDER_SITE_OTHER): Payer: 59 | Admitting: *Deleted

## 2021-07-01 ENCOUNTER — Other Ambulatory Visit: Payer: Self-pay

## 2021-07-01 ENCOUNTER — Other Ambulatory Visit (HOSPITAL_COMMUNITY)
Admission: RE | Admit: 2021-07-01 | Discharge: 2021-07-01 | Disposition: A | Discharge status: Home / Self Care | Payer: 59 | Source: Ambulatory Visit | Attending: Family Medicine | Admitting: Family Medicine

## 2021-07-01 VITALS — BP 148/100 | HR 80

## 2021-07-01 DIAGNOSIS — N898 Other specified noninflammatory disorders of vagina: Secondary | ICD-10-CM | POA: Diagnosis not present

## 2021-07-01 MED ORDER — FLUCONAZOLE 150 MG PO TABS: 150.0000 mg | ORAL_TABLET | Freq: Once | ORAL | 3 refills | Status: AC

## 2021-07-01 NOTE — Progress Notes (Signed)
Patient presents with vaginal irritation, itching & burning  x1 week.

## 2021-07-01 NOTE — Progress Notes (Cosign Needed)
SUBJECTIVE:  51 y.o. female complains of white vaginal discharge and vaginal itching for 7 day(s). Denies abnormal vaginal bleeding or significant pelvic pain or fever. No UTI symptoms. Denies history of known exposure to STD.  No LMP recorded. (Menstrual status: IUD).  OBJECTIVE:  She appears well, afebrile. Urine dipstick: not done.  ASSESSMENT:  Vaginal Discharge  Vaginal itching    PLAN:  BVAG, CVAG probe sent to lab. Treatment: Will go ahead and send in Diflucan.    ROV prn if symptoms persist or worsen.   Crosby Oyster, RN

## 2021-07-01 NOTE — Progress Notes (Signed)
Attestation of Attending Supervision of clinical support staff: I agree with the care provided to this patient and was available for any consultation.  I have reviewed the RN's note and chart. I was available for consult and to see the patient if needed.   Tashiya Souders MD MPH Attending Physician Faculty Practice- [facility]  

## 2021-07-03 ENCOUNTER — Encounter: Payer: Self-pay | Admitting: Family Medicine

## 2021-07-03 LAB — CERVICOVAGINAL ANCILLARY ONLY
Bacterial Vaginitis (gardnerella): NEGATIVE
Candida Glabrata: NEGATIVE
Candida Vaginitis: NEGATIVE
Comment: NEGATIVE
Comment: NEGATIVE
Comment: NEGATIVE

## 2021-07-14 ENCOUNTER — Other Ambulatory Visit: Payer: Self-pay | Admitting: Internal Medicine

## 2021-07-19 ENCOUNTER — Ambulatory Visit: Payer: 59 | Admitting: Internal Medicine

## 2021-07-26 ENCOUNTER — Other Ambulatory Visit: Payer: Self-pay | Admitting: Internal Medicine

## 2021-07-31 ENCOUNTER — Other Ambulatory Visit: Payer: Self-pay | Admitting: Internal Medicine

## 2021-08-01 NOTE — Telephone Encounter (Signed)
Is this okay to refill ? ? ?Last filled 07/15/21 qty # 52 ?Last OV 05/28/21 ?Next OV 12/03/21 ?

## 2021-08-28 ENCOUNTER — Ambulatory Visit (INDEPENDENT_AMBULATORY_CARE_PROVIDER_SITE_OTHER): Payer: 59 | Admitting: Family Medicine

## 2021-08-28 VITALS — BP 156/113 | HR 77 | Wt 192.0 lb

## 2021-08-28 DIAGNOSIS — I10 Essential (primary) hypertension: Secondary | ICD-10-CM | POA: Diagnosis not present

## 2021-08-28 DIAGNOSIS — Z01419 Encounter for gynecological examination (general) (routine) without abnormal findings: Secondary | ICD-10-CM

## 2021-08-28 DIAGNOSIS — Z124 Encounter for screening for malignant neoplasm of cervix: Secondary | ICD-10-CM | POA: Diagnosis not present

## 2021-08-28 DIAGNOSIS — N951 Menopausal and female climacteric states: Secondary | ICD-10-CM

## 2021-08-28 DIAGNOSIS — N898 Other specified noninflammatory disorders of vagina: Secondary | ICD-10-CM | POA: Diagnosis not present

## 2021-08-28 DIAGNOSIS — Z1231 Encounter for screening mammogram for malignant neoplasm of breast: Secondary | ICD-10-CM | POA: Diagnosis not present

## 2021-08-28 MED ORDER — ESTRADIOL 0.025 MG/24HR TD PTTW: 1.0000 | MEDICATED_PATCH | TRANSDERMAL | 12 refills | Status: DC

## 2021-08-28 NOTE — Assessment & Plan Note (Addendum)
51460 Discussed options at length. Strongly desires treatment with estrogen. Start with very low dose 0.'025mg'$ /day and transdermal patch. Risks include MI, CVA, DVT, PE. Has uterine protection with Liletta. Discussed usual time to onset of symptom relief. Has poorly controlled HTN, discussed may need more meds for this.Advised of HRT risks and benefits.  Discussed bone loss, heart health, breast cancer.  Also discussed changes related to menopause, normal progression and resolution of symptoms.  Advised to not stay on longer than 5 years and to be on lowest dose possible. ? ?

## 2021-08-28 NOTE — Progress Notes (Signed)
Subjective:  ?  ? Kristina Gardner is a 51 y.o. female and is here for a comprehensive physical exam. The patient reports problems - menopausal symptoms . Complained of this last year. Has tried various supplements. Taking gabapentin which has not really helped. Also trying melatonin to help her sleep. She is having hot flashes, night sweats, inability to sleep, hair loss, weight gain. She also is diabetic on metformin--A1C last 6.7, so well controlled. Her BP is still high on 3 agents, but had recent weight gain. ?Has Liletta in since 2017. No cycles. S/p UFE and has known fibroids. ? ?The following portions of the patient's history were reviewed and updated as appropriate: allergies, current medications, past family history, past medical history, past social history, past surgical history, and problem list. ? ?Review of Systems ?Pertinent items noted in HPI and remainder of comprehensive ROS otherwise negative.  ? ?Objective:  ? ? BP (!) 156/113   Pulse 77   Wt 192 lb (87.1 kg)   BMI 30.30 kg/m?  ?General appearance: alert, cooperative, and appears stated age ?Neck: no adenopathy, supple, symmetrical, trachea midline, and thyroid not enlarged, symmetric, no tenderness/mass/nodules ?Lungs: clear to auscultation bilaterally ?Breasts: normal appearance, no masses or tenderness ?Heart: regular rate and rhythm, S1, S2 normal, no murmur, click, rub or gallop ?Abdomen: soft, non-tender; bowel sounds normal; no masses,  no organomegaly ?Pelvic: cervix normal in appearance, external genitalia normal, no adnexal masses or tenderness, no cervical motion tenderness, vagina normal without discharge, and uterus is 10 wk size, very firm and calcified and non-tender ?Extremities: extremities normal, atraumatic, no cyanosis or edema ?Pulses: 2+ and symmetric ?Skin: Skin color, texture, turgor normal. No rashes or lesions ?Lymph nodes: Cervical, supraclavicular, and axillary nodes normal. ?Neurologic: Grossly normal  ?   ?Assessment:  ? ? GYN female exam.    ?  ?Plan:  ? ?Problem List Items Addressed This Visit   ? ?  ? Unprioritized  ? Essential hypertension, benign  ?  85929 - watch BP on new low dose ERT. To check at home ? ?  ?  ? Relevant Medications  ? aspirin EC 81 MG tablet  ? Menopausal symptoms  ?  24462 Discussed options at length. Strongly desires treatment with estrogen. Start with very low dose 0.'025mg'$ /day and transdermal patch. Risks include MI, CVA, DVT, PE. Has uterine protection with Liletta. Discussed usual time to onset of symptom relief. Has poorly controlled HTN, discussed may need more meds for this.Advised of HRT risks and benefits.  Discussed bone loss, heart health, breast cancer.  Also discussed changes related to menopause, normal progression and resolution of symptoms.  Advised to not stay on longer than 5 years and to be on lowest dose possible. ? ? ?  ?  ? Relevant Medications  ? estradiol (VIVELLE-DOT) 0.025 MG/24HR (Start on 08/29/2021)  ? ?Other Visit Diagnoses   ? ? Screening for malignant neoplasm of cervix    -  Primary  ? 99396  ? Relevant Orders  ? IGP, Aptima HPV, rfx 16/18,45  ? Encounter for gynecological examination without abnormal finding      ? 99396  ? Vaginal discharge      ? 86381 - check wet prep and treat accordingly  ? Relevant Orders  ? NuSwab VG, Candida 6sp  ? Encounter for screening mammogram for malignant neoplasm of breast      ? [phone]  ? Relevant Orders  ? MM 3D SCREEN BREAST BILATERAL  ? ?  ? ?  Return in 3 months (on 11/27/2021) for a follow-up. ? ?  ?See After Visit Summary for Counseling Recommendations  ? ?

## 2021-08-28 NOTE — Progress Notes (Signed)
ZSW:FUXN w/IUD  ?Last Pap:04/05/20  ?Contraception:IUD ?STD Screening:No ?Family Hx Breast Cancer:No ?Family Hx of Ovarian Cancer:No ?Last Mammogram 07/18/2020 ? ?CC: Not sleeping, Hot flashes , night sweats.  ?Discuss possible menopause.  ?Recently took OTC UTI medication AZO states feels better  ?However notes vaginal discharge.  ?Pt wants pap ?  ?

## 2021-08-28 NOTE — Assessment & Plan Note (Signed)
02111 - watch BP on new low dose ERT. To check at home ?

## 2021-08-28 NOTE — Addendum Note (Signed)
Addended by: Crosby Oyster on: 08/28/2021 10:30 AM ? ? Modules accepted: Orders ? ?

## 2021-09-01 LAB — NUSWAB VAGINITIS (VG)
Candida albicans, NAA: NEGATIVE
Candida glabrata, NAA: NEGATIVE
Trich vag by NAA: NEGATIVE

## 2021-09-06 ENCOUNTER — Encounter: Payer: Self-pay | Admitting: Family Medicine

## 2021-09-06 LAB — IGP, APTIMA HPV, RFX 16/18,45
HPV Aptima: POSITIVE — AB
PAP Smear Comment: 0

## 2021-09-26 ENCOUNTER — Other Ambulatory Visit: Payer: Self-pay | Admitting: Family Medicine

## 2021-09-26 DIAGNOSIS — N951 Menopausal and female climacteric states: Secondary | ICD-10-CM

## 2021-10-23 ENCOUNTER — Ambulatory Visit (INDEPENDENT_AMBULATORY_CARE_PROVIDER_SITE_OTHER): Payer: 59 | Admitting: Family Medicine

## 2021-10-23 ENCOUNTER — Other Ambulatory Visit: Payer: Self-pay | Admitting: Family Medicine

## 2021-10-23 VITALS — BP 134/88 | HR 87 | Wt 194.0 lb

## 2021-10-23 DIAGNOSIS — R87810 Cervical high risk human papillomavirus (HPV) DNA test positive: Secondary | ICD-10-CM

## 2021-10-23 DIAGNOSIS — R87610 Atypical squamous cells of undetermined significance on cytologic smear of cervix (ASC-US): Secondary | ICD-10-CM | POA: Diagnosis not present

## 2021-10-23 LAB — POCT URINE PREGNANCY: Preg Test, Ur: NEGATIVE

## 2021-10-23 NOTE — Progress Notes (Signed)
.     GYNECOLOGY OFFICE COLPOSCOPY PROCEDURE NOTE  51 y.o. No obstetric history on file. here for colposcopy for ASCUS with POSITIVE high risk HPV pap smear on 08/28/2021. Discussed role for HPV in cervical dysplasia, need for surveillance.  Patient gave informed written consent, time out was performed.  Placed in lithotomy position. Cervix viewed with speculum and colposcope after application of acetic acid. IUD strings noted  Colposcopy adequate? Yes  no visible lesions and acetowhite lesion(s) noted at SCJ 4 quadrant biopsies obtained.  ECC specimen obtained. All specimens were labeled and sent to pathology.  Chaperone was present during entire procedure.  Patient was given post procedure instructions.  Will follow up pathology and manage accordingly; patient will be contacted with results and recommendations.  Routine preventative health maintenance measures emphasized.  Donnamae Jude, MD 10/23/2021 4:53 PM  Discussed HRT--has tried for 3 weeks, without much help. Has since stopped this. Has patch and IUD for uterine protection. May restart.

## 2021-10-23 NOTE — Progress Notes (Signed)
Patient presents for Colpo following Abnormal pap on 08/28/21.  Pt denies any unprotected intercourse x 14 days.  UPT : Negative

## 2021-10-26 LAB — ANATOMIC PATHOLOGY REPORT

## 2021-11-27 ENCOUNTER — Encounter: Payer: Self-pay | Admitting: Family Medicine

## 2021-11-27 ENCOUNTER — Ambulatory Visit (INDEPENDENT_AMBULATORY_CARE_PROVIDER_SITE_OTHER): Payer: 59 | Admitting: Family Medicine

## 2021-11-27 VITALS — BP 132/88 | HR 89 | Wt 194.0 lb

## 2021-11-27 DIAGNOSIS — N951 Menopausal and female climacteric states: Secondary | ICD-10-CM | POA: Diagnosis not present

## 2021-11-27 NOTE — Assessment & Plan Note (Signed)
Discussed possibility of increasing her meds to 0.05, but she is reluctant to do this. Also, discussed sleep hygiene, nutrition and exercise as medicine. She will complete what she has left, and possibly message about increasing or if not effective, possibly discontinuing this.

## 2021-11-27 NOTE — Progress Notes (Signed)
Patient presents for F/U from [place] on 10/23/21.  Results : A-LOW-GRADE SQUAMOUS INTRAEPITHELIAL LESION (CIN  Specimen B-LOW-GRADE SQUAMOUS INTRAEPITHELIAL LESION (CIN  1). SQUAMOUS METAPLASIA.   Pt has been back on HRT and now notes discomfort from Right Breast.

## 2021-11-27 NOTE — Progress Notes (Signed)
    Subjective:    Patient ID: Kristina Gardner is a 51 y.o. female presenting with Follow-up  on 11/27/2021  HPI: Here for HRT f/u. On low dose patch with Liletta. Notes maybe a mild decrease in daily hot flashes, but still having night sweats and waking up at night and not sleeping well. Also notes some breast tenderness. Reports recent mammogram this year. Normal mammogram from 08/2021. Notes no masses.  Review of Systems  Constitutional:  Negative for chills and fever.  Respiratory:  Negative for shortness of breath.   Cardiovascular:  Negative for chest pain.  Gastrointestinal:  Negative for abdominal pain, nausea and vomiting.  Genitourinary:  Negative for dysuria.  Skin:  Negative for rash.      Objective:    BP 132/88   Pulse 89   Wt 194 lb (88 kg)   BMI 30.61 kg/m  Physical Exam Constitutional:      General: Kristina Gardner is not in acute distress.    Appearance: Kristina Gardner is well-developed.  HENT:     Head: Normocephalic and atraumatic.  Eyes:     General: No scleral icterus. Cardiovascular:     Rate and Rhythm: Normal rate.  Pulmonary:     Effort: Pulmonary effort is normal.  Abdominal:     Palpations: Abdomen is soft.  Musculoskeletal:     Cervical back: Neck supple.  Skin:    General: Skin is warm and dry.  Neurological:     Mental Status: Kristina Gardner is alert and oriented to person, place, and time.         Assessment & Plan:   Problem List Items Addressed This Visit       Unprioritized   Menopausal symptoms - Primary    Discussed possibility of increasing her meds to 0.05, but Kristina Gardner is reluctant to do this. Also, discussed sleep hygiene, nutrition and exercise as medicine. Kristina Gardner will complete what Kristina Gardner has left, and possibly message about increasing or if not effective, possibly discontinuing this.       Return in about 4 weeks (around 12/25/2021).  Donnamae Jude, MD 11/27/2021 9:49 AM

## 2021-11-29 ENCOUNTER — Ambulatory Visit: Payer: 59 | Admitting: Internal Medicine

## 2021-12-03 ENCOUNTER — Encounter: Payer: Self-pay | Admitting: Internal Medicine

## 2021-12-03 ENCOUNTER — Ambulatory Visit: Payer: 59 | Admitting: Internal Medicine

## 2021-12-03 VITALS — BP 124/80 | HR 84 | Temp 97.4°F | Ht 66.5 in | Wt 195.0 lb

## 2021-12-03 DIAGNOSIS — Z23 Encounter for immunization: Secondary | ICD-10-CM | POA: Diagnosis not present

## 2021-12-03 DIAGNOSIS — I10 Essential (primary) hypertension: Secondary | ICD-10-CM

## 2021-12-03 DIAGNOSIS — M7130 Other bursal cyst, unspecified site: Secondary | ICD-10-CM

## 2021-12-03 DIAGNOSIS — E1159 Type 2 diabetes mellitus with other circulatory complications: Secondary | ICD-10-CM | POA: Diagnosis not present

## 2021-12-03 LAB — POCT GLYCOSYLATED HEMOGLOBIN (HGB A1C): Hemoglobin A1C: 6.2 % — AB (ref 4.0–5.6)

## 2021-12-03 NOTE — Progress Notes (Signed)
Subjective:    Patient ID: Kristina Gardner, female    DOB: 1970/07/19, 51 y.o.   MRN: [id number]  HPI Here for follow up of diabetes  "I'm here" Works a lot--- 7 days a week at times (for the overtime) Trying to get a little money set aside  Does eat right Weight stable Not as much exercise since it got hot  Not checking sugars--hates to prick finger Slight numbness at tips of toes---temporary No sig pain No chest pain or SOB  Current Outpatient Medications on File Prior to Visit  Medication Sig Dispense Refill   aspirin EC 81 MG tablet Take 81 mg by mouth daily. Swallow whole.     DILT-XR 180 MG 24 hr capsule TAKE 1 CAPSULE BY MOUTH  DAILY 90 capsule 3   estradiol (VIVELLE-DOT) 0.025 MG/24HR Place 1 patch onto the skin 2 (two) times a week. 8 patch 12   gabapentin (NEURONTIN) 100 MG capsule Take 1-3 capsules (100-300 mg total) by mouth at bedtime. 90 capsule 3   glucose blood (ONETOUCH VERIO) test strip Use to check blood sugar once a day 100 each 4   levothyroxine (SYNTHROID) 100 MCG tablet TAKE 1 TABLET BY MOUTH  DAILY 90 tablet 3   losartan-hydrochlorothiazide (HYZAAR) 100-25 MG tablet TAKE 1 TABLET BY MOUTH EVERY DAY 90 tablet 3   metFORMIN (GLUCOPHAGE-XR) 500 MG 24 hr tablet TAKE 2 TABLETS(1000 MG) BY MOUTH DAILY WITH BREAKFAST 180 tablet 3   omeprazole (PRILOSEC) 20 MG capsule TAKE ONE CAPSULE BY MOUTH DAILY 180 capsule 3   No current facility-administered medications on file prior to visit.    Allergies  Allergen Reactions   Norvasc [Amlodipine Besylate] Other (See Comments)    dizziness    Past Medical History:  Diagnosis Date   Diabetes mellitus without complication (HCC)    Dysrhythmia    GERD (gastroesophageal reflux disease)    Hypertension    Leiomyoma of uterus, unspecified    Obesity    Pneumonia    Unspecified hypothyroidism     Past Surgical History:  Procedure Laterality Date   COLONOSCOPY WITH PROPOFOL N/A 08/15/2019   Procedure:  COLONOSCOPY WITH PROPOFOL;  Surgeon: Jonathon Bellows, MD;  Location: [facility] ENDOSCOPY;  Service: Gastroenterology;  Laterality: N/A;   COLONOSCOPY WITH PROPOFOL N/A 10/06/2019   Procedure: COLONOSCOPY WITH PROPOFOL;  Surgeon: Jonathon Bellows, MD;  Location: [facility] ENDOSCOPY;  Service: Gastroenterology;  Laterality: N/A;   DILATION AND CURETTAGE OF UTERUS  2000    Family History  Problem Relation Age of Onset   Hypertension Mother    Cancer Mother        Bone Marrow    Kidney disease Mother    Arthritis Sister    Cancer Paternal Grandfather        lung cancer   Diabetes Father    Cancer Maternal Aunt        from snuff   Dementia Maternal Aunt    Bladder Cancer Maternal Aunt    Graves' disease Other        multiple    Social History   Socioeconomic History   Marital status: Single    Spouse name: Not on file   Number of children: 1   Years of education: Not on file   Highest education level: Not on file  Occupational History   Occupation: [employer], customer service  Tobacco Use   Smoking status: Never    Passive exposure: Never   Smokeless tobacco: Never  Substance and  Sexual Activity   Alcohol use: Yes    Comment: occasional   Drug use: No   Sexual activity: Yes    Partners: Male    Birth control/protection: Condom, I.U.D.  Other Topics Concern   Not on file  Social History Narrative   1 daughter   Social Determinants of Health   Financial Resource Strain: Not on file  Food Insecurity: Not on file  Transportation Needs: Not on file  Physical Activity: Not on file  Stress: Not on file  Social Connections: Not on file  Intimate Partner Violence: Not on file   Review of Systems Trouble initiating sleep---usually sleeps 6 hours Takes melatonin at times--if "a lot on my mind" By herself---no boyfriend now. This is okay with her     Objective:   Physical Exam Constitutional:      Appearance: Normal appearance.  Cardiovascular:     Rate and Rhythm: Normal rate and  regular rhythm.     Pulses: Normal pulses.     Heart sounds: No murmur heard.    No gallop.  Pulmonary:     Effort: Pulmonary effort is normal.     Breath sounds: Normal breath sounds. No wheezing or rales.  Musculoskeletal:     Cervical back: Neck supple.     Right lower leg: No edema.     Left lower leg: No edema.  Lymphadenopathy:     Cervical: No cervical adenopathy.  Neurological:     Mental Status: She is alert.  Psychiatric:        Mood and Affect: Mood normal.        Behavior: Behavior normal.            Assessment & Plan:

## 2021-12-03 NOTE — Assessment & Plan Note (Signed)
.  doing well Lab Results  Component Value Date   HGBA1C 6.2 (A) 12/03/2021   Will continue the metformin 1000 daily

## 2021-12-03 NOTE — Assessment & Plan Note (Signed)
BP Readings from Last 3 Encounters:  12/03/21 124/80  11/27/21 132/88  10/23/21 134/88   Good control on diltiazem 180 and losartan/HCTZ 100/25

## 2021-12-03 NOTE — Assessment & Plan Note (Signed)
Volar right 3rd PIP It is bothering her Will set up with hand surgeon

## 2021-12-03 NOTE — Addendum Note (Signed)
Addended by: Pilar Grammes on: 12/03/2021 08:56 AM   Modules accepted: Orders

## 2021-12-05 ENCOUNTER — Ambulatory Visit: Payer: 59 | Admitting: Orthopedic Surgery

## 2021-12-05 DIAGNOSIS — R2231 Localized swelling, mass and lump, right upper limb: Secondary | ICD-10-CM | POA: Insufficient documentation

## 2021-12-05 NOTE — Progress Notes (Signed)
Office Visit Note   Patient: Kristina Gardner           Date of Birth: 13-Apr-1971           MRN: [id number] Visit Date: 12/05/2021              Requested by: Venia Carbon, MD [place],  [place] [id number] PCP: Venia Carbon, MD   Assessment & Plan: Visit Diagnoses:  1. Finger mass, right     Plan: Patient has a mass at the volar aspect of the right middle finger over the PIP joint.  The mass is blue-ish in color and looks to be a small vascular malformation.  Patient is interested in having the mass excised as it is occasionally bothersome for her.  I would like to get an ultrasound first to further evaluate the nature of the lesion.   Follow-Up Instructions: No follow-ups on file.   Orders:  No orders of the defined types were placed in this encounter.  No orders of the defined types were placed in this encounter.     Procedures: No procedures performed   Clinical Data: No additional findings.   Subjective: Chief Complaint  Patient presents with   Right Middle Finger - Pain    Is a 51 year old right-hand-dominant female presents with a mass of the volar aspect of the middle finger over the PIP joint.  The mass was first noticed incidentally on July 4 of this year.  She had some pain in the area when the lesion was first noticed.  The pain has generally subsided.  She is bothered by the appearance of the lesion.  She denies lumps or bumps elsewhere in the hand.  She denies any numbness or paresthesias in the hand.  She denies any injury to the hand.    Review of Systems   Objective: Vital Signs: There were no vitals taken for this visit.  Physical Exam Constitutional:      Appearance: Normal appearance.  Cardiovascular:     Rate and Rhythm: Normal rate.     Pulses: Normal pulses.  Pulmonary:     Effort: Pulmonary effort is normal.  Skin:    General: Skin is warm and dry.     Capillary Refill: Capillary refill takes less than  2 seconds.  Neurological:     Mental Status: She is alert.     Right Hand Exam   Tenderness  Right hand tenderness location: Minimal TTP over small mass of volar middle finger at PIP joint.  Other  Erythema: absent Sensation: normal Pulse: present  Comments:  Small, subcentimeter mass at volar aspect of middle finger over PIP flexion crease.  Mass is blue-ish in color and is firm, round, well circumscribed, and mobile.       Specialty Comments:  No specialty comments available.  Imaging: No results found.   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   GERD (gastroesophageal reflux disease)    Hyperlipidemia 05/15/2017   Obesity 02/07/2014   Routine general medical examination at a health care facility 07/25/2011   Hypothyroidism 09/07/2009   Leiomyoma of uterus 10/18/2008   Essential hypertension, benign 07/22/2006   Past Medical History:  Diagnosis Date   Diabetes mellitus without complication ([place])  Dysrhythmia    GERD (gastroesophageal reflux disease)    Hypertension    Leiomyoma of uterus, unspecified    Obesity    Pneumonia    Unspecified hypothyroidism     Family History  Problem Relation Age of Onset   Hypertension Mother    Cancer Mother        Bone Marrow    Kidney disease Mother    Arthritis Sister    Cancer Paternal Grandfather        lung cancer   Diabetes Father    Cancer Maternal Aunt        from snuff   Dementia Maternal Aunt    Bladder Cancer Maternal Aunt    Graves' disease Other        multiple    Past Surgical History:  Procedure Laterality Date   COLONOSCOPY WITH PROPOFOL N/A 08/15/2019   Procedure: COLONOSCOPY WITH PROPOFOL;  Surgeon: Jonathon Bellows, MD;  Location: [facility] ENDOSCOPY;  Service:  Gastroenterology;  Laterality: N/A;   COLONOSCOPY WITH PROPOFOL N/A 10/06/2019   Procedure: COLONOSCOPY WITH PROPOFOL;  Surgeon: Jonathon Bellows, MD;  Location: [facility] ENDOSCOPY;  Service: Gastroenterology;  Laterality: N/A;   DILATION AND CURETTAGE OF UTERUS  2000   Social History   Occupational History   Occupation: [employer], Therapist, art  Tobacco Use   Smoking status: Never    Passive exposure: Never   Smokeless tobacco: Never  Substance and Sexual Activity   Alcohol use: Yes    Comment: occasional   Drug use: No   Sexual activity: Yes    Partners: Male    Birth control/protection: Condom, I.U.D.

## 2021-12-10 ENCOUNTER — Ambulatory Visit
Admission: RE | Admit: 2021-12-10 | Discharge: 2021-12-10 | Disposition: A | Discharge status: Home / Self Care | Payer: 59 | Source: Ambulatory Visit | Attending: Orthopedic Surgery | Admitting: Orthopedic Surgery

## 2021-12-10 DIAGNOSIS — R2231 Localized swelling, mass and lump, right upper limb: Secondary | ICD-10-CM

## 2021-12-13 ENCOUNTER — Encounter: Payer: Self-pay | Admitting: Family Medicine

## 2021-12-16 ENCOUNTER — Other Ambulatory Visit: Payer: Self-pay | Admitting: Family Medicine

## 2021-12-16 DIAGNOSIS — N951 Menopausal and female climacteric states: Secondary | ICD-10-CM

## 2021-12-16 MED ORDER — ESTRADIOL 0.0375 MG/24HR TD PTTW: 1.0000 | MEDICATED_PATCH | TRANSDERMAL | 12 refills | Status: DC

## 2022-01-05 ENCOUNTER — Other Ambulatory Visit: Payer: Self-pay | Admitting: Family Medicine

## 2022-01-05 DIAGNOSIS — N951 Menopausal and female climacteric states: Secondary | ICD-10-CM

## 2022-01-14 ENCOUNTER — Other Ambulatory Visit: Payer: Self-pay | Admitting: Internal Medicine

## 2022-03-05 ENCOUNTER — Encounter: Payer: Self-pay | Admitting: Internal Medicine

## 2022-03-05 ENCOUNTER — Encounter: Payer: Self-pay | Admitting: Family Medicine

## 2022-03-12 MED ORDER — GABAPENTIN 100 MG PO CAPS: 100.0000 mg | ORAL_CAPSULE | Freq: Every day | ORAL | 3 refills | Status: DC

## 2022-03-13 ENCOUNTER — Other Ambulatory Visit: Payer: Self-pay | Admitting: Internal Medicine

## 2022-05-01 ENCOUNTER — Other Ambulatory Visit: Payer: Self-pay | Admitting: *Deleted

## 2022-05-01 ENCOUNTER — Encounter: Payer: Self-pay | Admitting: Family Medicine

## 2022-05-01 DIAGNOSIS — N951 Menopausal and female climacteric states: Secondary | ICD-10-CM

## 2022-05-01 MED ORDER — ESTRADIOL 0.0375 MG/24HR TD PTTW: 1.0000 | MEDICATED_PATCH | TRANSDERMAL | 3 refills | Status: DC

## 2022-05-01 NOTE — Progress Notes (Signed)
Pt needed Estradiol RX changed to a 90 day supply and sent to [facility]

## 2022-05-20 ENCOUNTER — Encounter: Payer: Self-pay | Admitting: Internal Medicine

## 2022-05-20 ENCOUNTER — Other Ambulatory Visit: Payer: Self-pay | Admitting: Internal Medicine

## 2022-05-20 ENCOUNTER — Ambulatory Visit (INDEPENDENT_AMBULATORY_CARE_PROVIDER_SITE_OTHER)
Admission: RE | Admit: 2022-05-20 | Discharge: 2022-05-20 | Disposition: A | Discharge status: Home / Self Care | Payer: 59 | Source: Ambulatory Visit | Attending: Internal Medicine | Admitting: Internal Medicine

## 2022-05-20 ENCOUNTER — Ambulatory Visit: Payer: 59 | Admitting: Internal Medicine

## 2022-05-20 VITALS — BP 122/78 | HR 93 | Temp 97.7°F | Ht 66.5 in | Wt 203.0 lb

## 2022-05-20 DIAGNOSIS — E1159 Type 2 diabetes mellitus with other circulatory complications: Secondary | ICD-10-CM

## 2022-05-20 DIAGNOSIS — R079 Chest pain, unspecified: Secondary | ICD-10-CM | POA: Diagnosis not present

## 2022-05-20 NOTE — Addendum Note (Signed)
Addended by: Ellamae Sia on: 05/20/2022 10:42 AM   Modules accepted: Orders

## 2022-05-20 NOTE — Assessment & Plan Note (Signed)
Hasn't been checking sugars Will check labs now including sed rate with the chest pain

## 2022-05-20 NOTE — Progress Notes (Signed)
Subjective:    Patient ID: Kristina Gardner, female    DOB: Sep 06, 1970, 52 y.o.   MRN: [id number]  HPI Here due to pain in chest  "I don't feel great" Has tight feeling in chest---hurts leaning forward or lying back Pain with deep breath--but no pain with normal breathing Started 2 days ago No fever No cough No SOB  Current Outpatient Medications on File Prior to Visit  Medication Sig Dispense Refill   aspirin EC 81 MG tablet Take 81 mg by mouth daily. Swallow whole.     DILT-XR 180 MG 24 hr capsule TAKE 1 CAPSULE BY MOUTH  DAILY 90 capsule 3   estradiol (VIVELLE-DOT) 0.0375 MG/24HR Place 1 patch onto the skin 2 (two) times a week. 24 patch 3   gabapentin (NEURONTIN) 100 MG capsule Take 1-3 capsules (100-300 mg total) by mouth at bedtime. 90 capsule 3   glucose blood (ONETOUCH VERIO) test strip Use to check blood sugar once a day 100 each 4   levothyroxine (SYNTHROID) 100 MCG tablet TAKE 1 TABLET BY MOUTH DAILY 90 tablet 0   losartan-hydrochlorothiazide (HYZAAR) 100-25 MG tablet TAKE 1 TABLET BY MOUTH EVERY DAY 90 tablet 3   metFORMIN (GLUCOPHAGE-XR) 500 MG 24 hr tablet TAKE 2 TABLETS(1000 MG) BY MOUTH DAILY WITH BREAKFAST 180 tablet 3   omeprazole (PRILOSEC) 20 MG capsule TAKE ONE CAPSULE BY MOUTH DAILY 180 capsule 3   No current facility-administered medications on file prior to visit.    Allergies  Allergen Reactions   Norvasc [Amlodipine Besylate] Other (See Comments)    dizziness    Past Medical History:  Diagnosis Date   Diabetes mellitus without complication (H[facility])    Dysrhythmia    GERD (gastroesophageal reflux disease)    Hypertension    Leiomyoma of uterus, unspecified    Obesity    Pneumonia    Unspecified hypothyroidism     Past Surgical History:  Procedure Laterality Date   COLONOSCOPY WITH PROPOFOL N/A 08/15/2019   Procedure: COLONOSCOPY WITH PROPOFOL;  Surgeon: Jonathon Bellows, MD;  Location: [facility] ENDOSCOPY;  Service: Gastroenterology;  Laterality:  N/A;   COLONOSCOPY WITH PROPOFOL N/A 10/06/2019   Procedure: COLONOSCOPY WITH PROPOFOL;  Surgeon: Jonathon Bellows, MD;  Location: [facility] ENDOSCOPY;  Service: Gastroenterology;  Laterality: N/A;   DILATION AND CURETTAGE OF UTERUS  2000    Family History  Problem Relation Age of Onset   Hypertension Mother    Cancer Mother        Bone Marrow    Kidney disease Mother    Arthritis Sister    Cancer Paternal Grandfather        lung cancer   Diabetes Father    Cancer Maternal Aunt        from snuff   Dementia Maternal Aunt    Bladder Cancer Maternal Aunt    Graves' disease Other        multiple    Social History   Socioeconomic History   Marital status: Single    Spouse name: Not on file   Number of children: 1   Years of education: Not on file   Highest education level: Not on file  Occupational History   Occupation: [employer], customer service  Tobacco Use   Smoking status: Never    Passive exposure: Never   Smokeless tobacco: Never  Substance and Sexual Activity   Alcohol use: Yes    Comment: occasional   Drug use: No   Sexual activity: Yes  Partners: Male    Birth control/protection: Condom, I.U.D.  Other Topics Concern   Not on file  Social History Narrative   1 daughter   Social Determinants of Health   Financial Resource Strain: Not on file  Food Insecurity: Not on file  Transportation Needs: Not on file  Physical Activity: Not on file  Stress: Not on file  Social Connections: Not on file  Intimate Partner Violence: Not on file   Review of Systems No edema No palpitations--other than when she gets mad/upset Hasn't checked sugar lately [address] last week--but just scraped knee (didn't hit chest)    Objective:   Physical Exam Constitutional:      Appearance: Normal appearance.  Cardiovascular:     Rate and Rhythm: Normal rate and regular rhythm.     Heart sounds: No murmur heard.    No gallop.  Pulmonary:     Effort: Pulmonary effort is normal. No  respiratory distress.     Breath sounds: Normal breath sounds. No stridor. No wheezing, rhonchi or rales.  Chest:     Chest wall: No tenderness.  Abdominal:     Palpations: Abdomen is soft.     Tenderness: There is no abdominal tenderness.  Musculoskeletal:     Cervical back: Neck supple.     Right lower leg: No edema.     Left lower leg: No edema.  Lymphadenopathy:     Cervical: No cervical adenopathy.  Neurological:     Mental Status: She is alert.            Assessment & Plan:

## 2022-05-20 NOTE — Assessment & Plan Note (Addendum)
History suggestive of pleurisy or pericarditis Not consistent with coronary ischemia or pulmonary embolus No recent infection symptoms No history of autoimmune disorder Will check EKG and CXR--and labs  CXR normal EKG --sinus at 92. Normal axis and intervals. No hypertrophy or ischemia. No sig change from 2008  Discussed likely benign etiology Trial of ibuprofen 400-'600mg'$  tid with food for the next week or so to see if it improves She will email with any sig changes

## 2022-05-21 LAB — MICROALBUMIN / CREATININE URINE RATIO
Creatinine, Urine: 40.2 mg/dL
Microalb/Creat Ratio: 17 mg/g creat (ref 0–29)
Microalbumin, Urine: 7 ug/mL

## 2022-05-21 LAB — LIPID PANEL
Chol/HDL Ratio: 3.8 ratio (ref 0.0–4.4)
Cholesterol, Total: 226 mg/dL — ABNORMAL HIGH (ref 100–199)
HDL: 60 mg/dL
LDL Chol Calc (NIH): 142 mg/dL — ABNORMAL HIGH (ref 0–99)
Triglycerides: 133 mg/dL (ref 0–149)
VLDL Cholesterol Cal: 24 mg/dL (ref 5–40)

## 2022-05-21 LAB — RENAL FUNCTION PANEL
Albumin: 4.3 g/dL (ref 3.8–4.9)
BUN/Creatinine Ratio: 17 (ref 9–23)
BUN: 14 mg/dL (ref 6–24)
CO2: 23 mmol/L (ref 20–29)
Calcium: 9.1 mg/dL (ref 8.7–10.2)
Chloride: 103 mmol/L (ref 96–106)
Creatinine, Ser: 0.81 mg/dL (ref 0.57–1.00)
Glucose: 87 mg/dL (ref 70–99)
Phosphorus: 3.2 mg/dL (ref 3.0–4.3)
Potassium: 3.7 mmol/L (ref 3.5–5.2)
Sodium: 141 mmol/L (ref 134–144)
eGFR: 87 mL/min/{1.73_m2} (ref >59)

## 2022-05-21 LAB — SEDIMENTATION RATE: Sed Rate: 36 mm/hr (ref 0–40)

## 2022-05-21 LAB — CBC
Hematocrit: 47.3 % — ABNORMAL HIGH (ref 34.0–46.6)
Hemoglobin: 15.1 g/dL (ref 11.1–15.9)
MCH: 27.9 pg (ref 26.6–33.0)
MCHC: 31.9 g/dL (ref 31.5–35.7)
MCV: 87 fL (ref 79–97)
Platelets: 318 x10E3/uL (ref 150–450)
RBC: 5.42 x10E6/uL — ABNORMAL HIGH (ref 3.77–5.28)
RDW: 13.1 % (ref 11.7–15.4)
WBC: 5.7 x10E3/uL (ref 3.4–10.8)

## 2022-05-21 LAB — TSH: TSH: 2.31 u[IU]/mL (ref 0.450–4.500)

## 2022-05-21 LAB — HEMOGLOBIN A1C
Est. average glucose Bld gHb Est-mCnc: 128 mg/dL
Hgb A1c MFr Bld: 6.1 % — ABNORMAL HIGH (ref 4.8–5.6)

## 2022-05-21 LAB — HEPATIC FUNCTION PANEL
ALT: 9 IU/L (ref 0–32)
AST: 15 IU/L (ref 0–40)
Alkaline Phosphatase: 154 IU/L — ABNORMAL HIGH (ref 44–121)
Bilirubin Total: 0.3 mg/dL (ref 0.0–1.2)
Bilirubin, Direct: 0.11 mg/dL (ref 0.00–0.40)
Total Protein: 7.3 g/dL (ref 6.0–8.5)

## 2022-06-06 ENCOUNTER — Encounter: Payer: Self-pay | Admitting: Internal Medicine

## 2022-06-06 ENCOUNTER — Ambulatory Visit (INDEPENDENT_AMBULATORY_CARE_PROVIDER_SITE_OTHER): Payer: 59 | Admitting: Internal Medicine

## 2022-06-06 VITALS — BP 130/88 | HR 78 | Temp 97.2°F | Ht 67.0 in | Wt 199.0 lb

## 2022-06-06 DIAGNOSIS — E1159 Type 2 diabetes mellitus with other circulatory complications: Secondary | ICD-10-CM

## 2022-06-06 DIAGNOSIS — E039 Hypothyroidism, unspecified: Secondary | ICD-10-CM

## 2022-06-06 DIAGNOSIS — Z0000 Encounter for general adult medical examination without abnormal findings: Secondary | ICD-10-CM | POA: Diagnosis not present

## 2022-06-06 DIAGNOSIS — I10 Essential (primary) hypertension: Secondary | ICD-10-CM | POA: Diagnosis not present

## 2022-06-06 LAB — HM DIABETES FOOT EXAM

## 2022-06-06 MED ORDER — ROSUVASTATIN CALCIUM 5 MG PO TABS: 5.0000 mg | ORAL_TABLET | Freq: Every day | ORAL | 3 refills | Status: DC

## 2022-06-06 NOTE — Assessment & Plan Note (Signed)
Healthy Discussed increasing exercise Colon due in June Yearly mammogram in summer Pap per gyn Prefers no flu vaccines. Recommended updated COVID

## 2022-06-06 NOTE — Assessment & Plan Note (Signed)
Lab Results  Component Value Date   HGBA1C 6.1 (H) 05/20/2022   Good control on metformin 1000 daily

## 2022-06-06 NOTE — Assessment & Plan Note (Signed)
BP Readings from Last 3 Encounters:  06/06/22 130/88  05/20/22 122/78  12/03/21 124/80   Controlled on losartan/HCTZ 100/25 and diltizem '240mg'$  daily Will add statin

## 2022-06-06 NOTE — Assessment & Plan Note (Signed)
Last labs fine

## 2022-06-06 NOTE — Progress Notes (Signed)
Subjective:    Patient ID: Kristina Gardner, female    DOB: 10-09-1970, 52 y.o.   MRN: [id number]  HPI Here for physical  Feels better from her illness No new problems  Same job No change in Family History  Doesn't check sugars often--doesn't like sticking herself 111 this morning Has been watching the carbs  Estrogen patch really helps her menopausal symptoms Only needs the gabapentin occasionally at night (for hot flashes)  Current Outpatient Medications on File Prior to Visit  Medication Sig Dispense Refill   aspirin EC 81 MG tablet Take 81 mg by mouth daily. Swallow whole.     DILT-XR 180 MG 24 hr capsule TAKE 1 CAPSULE BY MOUTH  DAILY 90 capsule 3   estradiol (VIVELLE-DOT) 0.0375 MG/24HR Place 1 patch onto the skin 2 (two) times a week. 24 patch 3   gabapentin (NEURONTIN) 100 MG capsule Take 1-3 capsules (100-300 mg total) by mouth at bedtime. 90 capsule 3   glucose blood (ONETOUCH VERIO) test strip Use to check blood sugar once a day 100 each 4   levothyroxine (SYNTHROID) 100 MCG tablet TAKE 1 TABLET BY MOUTH DAILY 90 tablet 3   losartan-hydrochlorothiazide (HYZAAR) 100-25 MG tablet TAKE 1 TABLET BY MOUTH EVERY DAY 90 tablet 3   metFORMIN (GLUCOPHAGE-XR) 500 MG 24 hr tablet TAKE 2 TABLETS(1000 MG) BY MOUTH DAILY WITH BREAKFAST 180 tablet 3   omeprazole (PRILOSEC) 20 MG capsule TAKE ONE CAPSULE BY MOUTH DAILY 180 capsule 3   No current facility-administered medications on file prior to visit.    Allergies  Allergen Reactions   Norvasc [Amlodipine Besylate] Other (See Comments)    dizziness    Past Medical History:  Diagnosis Date   Diabetes mellitus without complication (HCC)    Dysrhythmia    GERD (gastroesophageal reflux disease)    Hypertension    Leiomyoma of uterus, unspecified    Obesity    Pneumonia    Unspecified hypothyroidism     Past Surgical History:  Procedure Laterality Date   COLONOSCOPY WITH PROPOFOL N/A 08/15/2019   Procedure:  COLONOSCOPY WITH PROPOFOL;  Surgeon: Jonathon Bellows, MD;  Location: [facility] ENDOSCOPY;  Service: Gastroenterology;  Laterality: N/A;   COLONOSCOPY WITH PROPOFOL N/A 10/06/2019   Procedure: COLONOSCOPY WITH PROPOFOL;  Surgeon: Jonathon Bellows, MD;  Location: [facility] ENDOSCOPY;  Service: Gastroenterology;  Laterality: N/A;   DILATION AND CURETTAGE OF UTERUS  2000    Family History  Problem Relation Age of Onset   Hypertension Mother    Cancer Mother        Bone Marrow    Kidney disease Mother    Arthritis Sister    Cancer Paternal Grandfather        lung cancer   Diabetes Father    Cancer Maternal Aunt        from snuff   Dementia Maternal Aunt    Bladder Cancer Maternal Aunt    Graves' disease Other        multiple    Social History   Socioeconomic History   Marital status: Single    Spouse name: Not on file   Number of children: 1   Years of education: Not on file   Highest education level: Not on file  Occupational History   Occupation: [employer], customer service  Tobacco Use   Smoking status: Never    Passive exposure: Never   Smokeless tobacco: Never  Vaping Use   Vaping Use: Never used  Substance and Sexual Activity  Alcohol use: Yes    Comment: occasional   Drug use: No   Sexual activity: Yes    Partners: Male    Birth control/protection: Condom, I.U.D.  Other Topics Concern   Not on file  Social History Narrative   1 daughter   Social Determinants of Health   Financial Resource Strain: Not on file  Food Insecurity: Not on file  Transportation Needs: Not on file  Physical Activity: Not on file  Stress: Not on file  Social Connections: Not on file  Intimate Partner Violence: Not on file   Review of Systems  Constitutional:  Negative for fatigue and unexpected weight change.       Wears seat belt  HENT:  Negative for hearing loss and tinnitus.        Recent dental visit--trouble with a right nerve/sensitivity problem  Eyes:  Negative for visual disturbance.        Going to eye doctor next week  Respiratory:  Negative for cough, chest tightness and shortness of breath.   Cardiovascular:  Negative for chest pain, palpitations and leg swelling.  Gastrointestinal:  Positive for constipation. Negative for blood in stool.       No heartburn  Endocrine: Negative for polydipsia and polyuria.  Genitourinary:  Negative for dyspareunia, dysuria and hematuria.  Musculoskeletal:  Negative for arthralgias and joint swelling.       Will get back twinges at times  Skin:  Negative for rash.  Allergic/Immunologic: Negative for environmental allergies and immunocompromised state.  Neurological:  Negative for dizziness, syncope and light-headedness.       No foot burning or tingling/numbness (other than over left bunion)  Hematological:  Negative for adenopathy. Does not bruise/bleed easily.  Psychiatric/Behavioral:  Negative for dysphoric mood and sleep disturbance. The patient is not nervous/anxious.        Objective:   Physical Exam Constitutional:      Appearance: Normal appearance.  HENT:     Mouth/Throat:     Pharynx: No oropharyngeal exudate or posterior oropharyngeal erythema.  Eyes:     Conjunctiva/sclera: Conjunctivae normal.     Pupils: Pupils are equal, round, and reactive to light.  Cardiovascular:     Rate and Rhythm: Normal rate and regular rhythm.     Pulses: Normal pulses.     Heart sounds: No murmur heard.    No gallop.  Pulmonary:     Effort: Pulmonary effort is normal.     Breath sounds: Normal breath sounds. No wheezing or rales.  Abdominal:     Palpations: Abdomen is soft.     Tenderness: There is no abdominal tenderness.  Musculoskeletal:     Cervical back: Neck supple.     Right lower leg: No edema.     Left lower leg: No edema.  Lymphadenopathy:     Cervical: No cervical adenopathy.  Skin:    Findings: No rash.     Comments: No foot lesions  Neurological:     General: No focal deficit present.     Mental Status: She is  alert and oriented to person, place, and time.     Comments: Normal sensation in feet  Psychiatric:        Mood and Affect: Mood normal.        Behavior: Behavior normal.            Assessment & Plan:

## 2022-06-12 LAB — HM DIABETES EYE EXAM

## 2022-07-15 ENCOUNTER — Emergency Department
Admission: EM | Admit: 2022-07-15 | Discharge: 2022-07-15 | Disposition: A | Discharge status: Home / Self Care | Payer: 59 | Attending: Emergency Medicine | Admitting: Emergency Medicine

## 2022-07-15 ENCOUNTER — Emergency Department: Payer: 59

## 2022-07-15 ENCOUNTER — Other Ambulatory Visit: Payer: Self-pay | Admitting: Internal Medicine

## 2022-07-15 DIAGNOSIS — S86912A Strain of unspecified muscle(s) and tendon(s) at lower leg level, left leg, initial encounter: Secondary | ICD-10-CM | POA: Insufficient documentation

## 2022-07-15 DIAGNOSIS — X501XXA Overexertion from prolonged static or awkward postures, initial encounter: Secondary | ICD-10-CM | POA: Diagnosis not present

## 2022-07-15 DIAGNOSIS — M25562 Pain in left knee: Secondary | ICD-10-CM | POA: Diagnosis present

## 2022-07-15 MED ORDER — MELOXICAM 15 MG PO TABS: 15.0000 mg | ORAL_TABLET | Freq: Every day | ORAL | 0 refills | Status: DC

## 2022-07-15 NOTE — ED Provider Notes (Signed)
[facility] Provider Note  Patient Contact: 4:06 PM (approximate)   History   Knee Pain   HPI  Kristina Gardner is a 52 y.o. female who presents emergency department complaining of left knee pain.  Patient states that she had an injury to the left knee last week.  She believes she was stumbling or twisted wrong on her knee, felt and heard a pop.  She had swelling to the knee.  She has been using ice, knee was doing better until she struck the side of a cabinet with her knee yesterday.  Ongoing pain to the left knee joint this time.  No other injury or complaint.  No history of previous knee injuries requiring surgery     Physical Exam   Triage Vital Signs: ED Triage Vitals  Enc Vitals Group     BP 07/15/22 1502 (!) 172/109     Pulse Rate 07/15/22 1502 91     Resp 07/15/22 1502 18     Temp 07/15/22 1502 98 F (36.7 C)     Temp Source 07/15/22 1502 Oral     SpO2 07/15/22 1502 96 %     Weight 07/15/22 1500 199 lb 1.2 oz (90.3 kg)     Height 07/15/22 1500 5\' 7"  (1.702 m)     Head Circumference --      Peak Flow --      Pain Score 07/15/22 1500 8     Pain Loc --      Pain Edu? --      Excl. in GC? --     Most recent vital signs: Vitals:   07/15/22 1502  BP: (!) 172/109  Pulse: 91  Resp: 18  Temp: 98 F (36.7 C)  SpO2: 96%     General: Alert and in no acute distress.  Cardiovascular:  Good peripheral perfusion Respiratory: Normal respiratory effort without tachypnea or retractions. Lungs CTAB.  Musculoskeletal: Full range of motion to all extremities.  Visualization of the left knee reveals no gross edema.  No ecchymosis.  No open wounds.  Tender slightly in the lateral suprapatellar region.  No ballottement.  No other significant tenderness.  Varus, valgus, Lachman's, McMurray's the knee is negative.  Dorsalis pedis pulses sensation intact distally. Neurologic:  No gross focal neurologic deficits are appreciated.  Skin:   No rash  noted Other:   ED Results / Procedures / Treatments   Labs (all labs ordered are listed, but only abnormal results are displayed) Labs Reviewed - No data to display   EKG     RADIOLOGY  I personally viewed, evaluated, and interpreted these images as part of my medical decision making, as well as reviewing the written report by the radiologist.  ED Provider Interpretation: Tricompartmental arthritis without acute finding.  DG Knee Complete 4 Views Left  Result Date: 07/15/2022 CLINICAL DATA:  Left knee pain EXAM: LEFT KNEE - COMPLETE 4+ VIEW COMPARISON:  None Available. FINDINGS: No evidence of fracture, dislocation, or joint effusion. Tricompartmental joint space narrowing prominent in the patellofemoral compartment with marginal spurring. Soft tissues are unremarkable. IMPRESSION: Tricompartmental knee osteoarthritis prominent in the patellofemoral compartment. No acute fracture or dislocation. Electronically Signed   By: Larose Hires D.O.   On: 07/15/2022 17:07    PROCEDURES:  Critical Care performed: No  Procedures   MEDICATIONS ORDERED IN ED: Medications - No data to display   IMPRESSION / MDM / ASSESSMENT AND PLAN / ED COURSE  I reviewed the triage  vital signs and the nursing notes.                                 Differential diagnosis includes, but is not limited to, arthritis, knee strain, ligament rupture, occult fracture  Patient's presentation is most consistent with acute presentation with potential threat to life or bodily function.   Patient's diagnosis is consistent with history.  Patient presents emergency department after having an injury over a week ago to the knee.  Patient with a popping in her knee, had some edema that had largely improved.  Patient ran into a cabinet today and had return of her knee pain.  Overall exam was reassuring, x-ray reveals some mild tricompartmental arthritis.  No acute finding.  Symptom control with anti-inflammatory,  hinged knee brace.  Follow-up with primary care or orthopedics as needed..  Patient is given ED precautions to return to the ED for any worsening or new symptoms.     FINAL CLINICAL IMPRESSION(S) / ED DIAGNOSES   Final diagnoses:  Strain of left knee, initial encounter     Rx / DC Orders   ED Discharge Orders          Ordered    meloxicam (MOBIC) 15 MG tablet  Daily        07/15/22 1740             Note:  This document was prepared using Dragon voice recognition software and may include unintentional dictation errors.   Racheal Patches, PA-C 07/15/22 1742    Shaune Pollack, MD 07/17/22 [phone]

## 2022-07-15 NOTE — ED Triage Notes (Signed)
Pt present to the ED due to knee pain. Pt states her knee started hurting. Pt states she is having a hard time walking. Pt NAD

## 2022-07-16 ENCOUNTER — Encounter: Payer: Self-pay | Admitting: Family Medicine

## 2022-07-16 ENCOUNTER — Telehealth: Payer: Self-pay

## 2022-07-16 ENCOUNTER — Ambulatory Visit (INDEPENDENT_AMBULATORY_CARE_PROVIDER_SITE_OTHER): Payer: 59 | Admitting: Family Medicine

## 2022-07-16 VITALS — BP 131/86 | HR 87 | Wt 199.0 lb

## 2022-07-16 DIAGNOSIS — Z01419 Encounter for gynecological examination (general) (routine) without abnormal findings: Secondary | ICD-10-CM | POA: Diagnosis not present

## 2022-07-16 DIAGNOSIS — D250 Submucous leiomyoma of uterus: Secondary | ICD-10-CM

## 2022-07-16 DIAGNOSIS — D251 Intramural leiomyoma of uterus: Secondary | ICD-10-CM | POA: Diagnosis not present

## 2022-07-16 DIAGNOSIS — Z1231 Encounter for screening mammogram for malignant neoplasm of breast: Secondary | ICD-10-CM

## 2022-07-16 DIAGNOSIS — Z124 Encounter for screening for malignant neoplasm of cervix: Secondary | ICD-10-CM

## 2022-07-16 DIAGNOSIS — N951 Menopausal and female climacteric states: Secondary | ICD-10-CM

## 2022-07-16 MED ORDER — ESTRADIOL 0.0375 MG/24HR TD PTTW: 1.0000 | MEDICATED_PATCH | TRANSDERMAL | 3 refills | Status: DC

## 2022-07-16 NOTE — Progress Notes (Addendum)
Subjective:     Kristina Gardner is a 52 y.o. female and is here for a comprehensive physical exam. The patient reports problems - left sided knee pain, with arthritis  On Vivelle with Liletta in place x 7 years. No bleeding. Hot flashes are much improved.   The following portions of the patient's history were reviewed and updated as appropriate: allergies, current medications, past family history, past medical history, past social history, past surgical history, and problem list.  Review of Systems Pertinent items noted in HPI and remainder of comprehensive ROS otherwise negative.   Objective:  Chaperone present for exam   BP 131/86   Pulse 87   Wt 199 lb (90.3 kg)   BMI 31.17 kg/m  General appearance: alert, cooperative, and appears stated age Head: Normocephalic, without obvious abnormality, atraumatic Neck: no adenopathy, supple, symmetrical, trachea midline, and thyroid not enlarged, symmetric, no tenderness/mass/nodules Lungs: clear to auscultation bilaterally Breasts: normal appearance, no masses or tenderness Heart: regular rate and rhythm, S1, S2 normal, no murmur, click, rub or gallop Abdomen: soft, non-tender; bowel sounds normal; no masses,  no organomegaly Pelvic: external genitalia normal, no adnexal masses or tenderness, no cervical motion tenderness, vagina normal without discharge, and uterus is quite firm and posterior, large calcified fibroid noted Extremities: extremities normal, atraumatic, no cyanosis or edema Pulses: 2+ and symmetric Skin: Skin color, texture, turgor normal. No rashes or lesions Lymph nodes: Cervical, supraclavicular, and axillary nodes normal. Neurologic: Grossly normal    Assessment:   GYN female exam.      Plan:   Problem List Items Addressed This Visit       Unprioritized   Leiomyoma of uterus    99214 - Large and calcified. Likely unchanged. No bleeding with IUD in place. Due for change in 2025.      Menopausal symptoms     99214 - refilled her vivelle. Has Liletta in place.      Relevant Medications   estradiol (VIVELLE-DOT) 0.0375 MG/24HR (Start on 07/17/2022)   Other Visit Diagnoses     Screening for malignant neoplasm of cervix    -  Primary   Relevant Orders   IGP, Aptima HPV, rfx 16/18,45   Encounter for gynecological examination without abnormal finding       Encounter for screening mammogram for malignant neoplasm of breast       Relevant Orders   MM 3D SCREENING MAMMOGRAM BILATERAL BREAST      Return in 1 year (on 07/16/2023).    See After Visit Summary for Counseling Recommendations

## 2022-07-16 NOTE — Assessment & Plan Note (Addendum)
[id number] - refilled her vivelle. Has Liletta in place.

## 2022-07-16 NOTE — Assessment & Plan Note (Addendum)
[id number] - Large and calcified. Likely unchanged. No bleeding with IUD in place. Due for change in 2025.

## 2022-07-16 NOTE — Transitions of Care (Post Inpatient/ED Visit) (Signed)
   07/16/2022  Name: Kristina Gardner MRN: [id number] DOB: 08-Aug-1970  Today's TOC FU Call Status: Today's TOC FU Call Status:: Successful TOC FU Call Competed TOC FU Call Complete Date: 07/16/22  Transition Care Management Follow-up Telephone Call Date of Discharge: 07/15/22 Discharge Facility: [facility]) Type of Discharge: Emergency Department Reason for ED Visit: Other: (Strain of left knee,) How have you been since you were released from the hospital?: Better Any questions or concerns?: No  Items Reviewed: Did you receive and understand the discharge instructions provided?: Yes Medications obtained and verified?: Yes (Medications Reviewed) Any new allergies since your discharge?: No Dietary orders reviewed?: Yes Do you have support at home?: Yes  Home Care and Equipment/Supplies: [place] Ordered?: No Any new equipment or medical supplies ordered?: No  Functional Questionnaire: Do you need assistance with bathing/showering or dressing?: No Do you need assistance with meal preparation?: No Do you need assistance with eating?: No Do you have difficulty maintaining continence: No Do you need assistance with getting out of bed/getting out of a chair/moving?: No Do you have difficulty managing or taking your medications?: No  Folllow up appointments reviewed: PCP Follow-up appointment confirmed?: No (states she will call at beginning of next week if not better) MD Provider Line Number:[phone] Given: Yes [facility] Follow-up appointment confirmed?: No Do you need transportation to your follow-up appointment?: No Do you understand care options if your condition(s) worsen?: Yes-patient verbalized understanding    [place] LPN [place] [phone]

## 2022-07-16 NOTE — Progress Notes (Signed)
CC: annual   Last pap:2023  Abnormal results   Declines any STD screenings added to pap

## 2022-07-23 ENCOUNTER — Ambulatory Visit: Payer: 59 | Admitting: Internal Medicine

## 2022-07-23 ENCOUNTER — Encounter: Payer: Self-pay | Admitting: Internal Medicine

## 2022-07-23 VITALS — BP 130/86 | HR 91 | Temp 97.4°F | Ht 67.0 in | Wt 199.0 lb

## 2022-07-23 DIAGNOSIS — S86912A Strain of unspecified muscle(s) and tendon(s) at lower leg level, left leg, initial encounter: Secondary | ICD-10-CM | POA: Diagnosis not present

## 2022-07-23 NOTE — Assessment & Plan Note (Addendum)
No obvious internal instability Has significant OA on x-ray--but no prior significant symptoms with it Okay to continue the ibuprofen 600 tid for a short time--stop the meloxicam Will continue the brace till she feels better If ongoing symptoms, will set up with ortho or Dr Lorelei Pont

## 2022-07-23 NOTE — Progress Notes (Signed)
Subjective:    Patient ID: Kristina Gardner, female    DOB: April 19, 1971, 52 y.o.   MRN: [id number]  HPI Here for ED follow up---left knee pain and injury  Noted some left knee swelling 2 weeks ago---did feel a pop when trying to help get her sister out of the tub Swelling started right away Tried brace and iced it Swelling went down Then a week later--couldn't bear weight or walk on it Still feels it up the back of her leg  Seen in ER 8 days ago X-ray just showed arthritis Given meloxicam--but doesn't seem to be helping Has tried ibuprofen-- 600mg  ---this helps more  Swelling is better Still pops Still wearing hinged brace that she got in ER  Current Outpatient Medications on File Prior to Visit  Medication Sig Dispense Refill   aspirin EC 81 MG tablet Take 81 mg by mouth daily. Swallow whole.     DILT-XR 180 MG 24 hr capsule TAKE 1 CAPSULE BY MOUTH  DAILY 90 capsule 3   estradiol (VIVELLE-DOT) 0.0375 MG/24HR Place 1 patch onto the skin 2 (two) times a week. 24 patch 3   gabapentin (NEURONTIN) 100 MG capsule TAKE 1 TO 3 CAPSULES BY MOUTH AT BEDTIME 90 capsule 3   glucose blood (ONETOUCH VERIO) test strip Use to check blood sugar once a day 100 each 4   levothyroxine (SYNTHROID) 100 MCG tablet TAKE 1 TABLET BY MOUTH DAILY 90 tablet 3   losartan-hydrochlorothiazide (HYZAAR) 100-25 MG tablet TAKE 1 TABLET BY MOUTH EVERY DAY 90 tablet 3   meloxicam (MOBIC) 15 MG tablet Take 1 tablet (15 mg total) by mouth daily. 30 tablet 0   metFORMIN (GLUCOPHAGE-XR) 500 MG 24 hr tablet TAKE 2 TABLETS(1000 MG) BY MOUTH DAILY WITH BREAKFAST 180 tablet 3   omeprazole (PRILOSEC) 20 MG capsule TAKE ONE CAPSULE BY MOUTH DAILY 180 capsule 3   rosuvastatin (CRESTOR) 5 MG tablet Take 1 tablet (5 mg total) by mouth daily. 90 tablet 3   No current facility-administered medications on file prior to visit.    Allergies  Allergen Reactions   Norvasc [Amlodipine Besylate] Other (See Comments)     dizziness    Past Medical History:  Diagnosis Date   Diabetes mellitus without complication (HCC)    Dysrhythmia    GERD (gastroesophageal reflux disease)    Hypertension    Leiomyoma of uterus, unspecified    Obesity    Pneumonia    Unspecified hypothyroidism     Past Surgical History:  Procedure Laterality Date   COLONOSCOPY WITH PROPOFOL N/A 08/15/2019   Procedure: COLONOSCOPY WITH PROPOFOL;  Surgeon: Jonathon Bellows, MD;  Location: [facility] ENDOSCOPY;  Service: Gastroenterology;  Laterality: N/A;   COLONOSCOPY WITH PROPOFOL N/A 10/06/2019   Procedure: COLONOSCOPY WITH PROPOFOL;  Surgeon: Jonathon Bellows, MD;  Location: [facility] ENDOSCOPY;  Service: Gastroenterology;  Laterality: N/A;   DILATION AND CURETTAGE OF UTERUS  2000    Family History  Problem Relation Age of Onset   Hypertension Mother    Cancer Mother        Bone Marrow    Kidney disease Mother    Arthritis Sister    Cancer Paternal Grandfather        lung cancer   Diabetes Father    Cancer Maternal Aunt        from snuff   Dementia Maternal Aunt    Bladder Cancer Maternal Okey Regal' disease Other        multiple  Social History   Socioeconomic History   Marital status: Single    Spouse name: Not on file   Number of children: 1   Years of education: Not on file   Highest education level: Not on file  Occupational History   Occupation: [employer], customer service  Tobacco Use   Smoking status: Never    Passive exposure: Never   Smokeless tobacco: Never  Vaping Use   Vaping Use: Never used  Substance and Sexual Activity   Alcohol use: Yes    Comment: occasional   Drug use: No   Sexual activity: Yes    Partners: Male    Birth control/protection: Condom, I.U.D.  Other Topics Concern   Not on file  Social History Narrative   1 daughter   Social Determinants of Health   Financial Resource Strain: Not on file  Food Insecurity: Not on file  Transportation Needs: Not on file  Physical Activity: Not on  file  Stress: Not on file  Social Connections: Not on file  Intimate Partner Violence: Not on file   Review of Systems Has had past swelling No fever     Objective:   Physical Exam Constitutional:      Appearance: Normal appearance.  Musculoskeletal:     Comments: Slight left knee swelling---unclear if effusion No ligament or meniscus findings ROM seems fairly full No calf swelling or tenderness  Neurological:     Mental Status: She is alert.     Comments: Fairly normal gait No leg weakness            Assessment & Plan:

## 2022-07-29 LAB — IGP, APTIMA HPV, RFX 16/18,45
HPV Aptima: NEGATIVE
PAP Smear Comment: 0

## 2022-07-30 ENCOUNTER — Telehealth: Payer: Self-pay | Admitting: *Deleted

## 2022-07-30 NOTE — Telephone Encounter (Signed)
-----   Message from Donnamae Jude, MD sent at 07/30/2022  8:17 AM EDT ----- Recall pap in 1 year

## 2022-07-30 NOTE — Telephone Encounter (Signed)
Pt informed of pap results and recommendation to repeat in 1year

## 2022-08-11 ENCOUNTER — Encounter: Payer: Self-pay | Admitting: Internal Medicine

## 2022-08-14 ENCOUNTER — Other Ambulatory Visit: Payer: Self-pay | Admitting: *Deleted

## 2022-08-14 ENCOUNTER — Telehealth: Payer: Self-pay | Admitting: *Deleted

## 2022-08-14 DIAGNOSIS — Z86010 Personal history of colonic polyps: Secondary | ICD-10-CM

## 2022-08-14 MED ORDER — NA SULFATE-K SULFATE-MG SULF 17.5-3.13-1.6 GM/177ML PO SOLN: 1.0000 | Freq: Once | ORAL | 0 refills | Status: AC

## 2022-08-14 NOTE — Telephone Encounter (Signed)
Gastroenterology Pre-Procedure Review  Request Date: 10/20/2022 Requesting Physician: Dr. Tobi Bastos  PATIENT REVIEW QUESTIONS: The patient responded to the following health history questions as indicated:    1. Are you having any GI issues? no 2. Do you have a personal history of Polyps? yes (6) 3. Do you have a family history of Colon Cancer or Polyps? no 4. Diabetes Mellitus? yes (taking metformin) 5. Joint replacements in the past 12 months?no 6. Major health problems in the past 3 months?no 7. Any artificial heart valves, MVP, or defibrillator?no    MEDICATIONS & ALLERGIES:    Patient reports the following regarding taking any anticoagulation/antiplatelet therapy:   Plavix, Coumadin, Eliquis, Xarelto, Lovenox, Pradaxa, Brilinta, or Effient? no Aspirin? yes (81 mg)  Patient confirms/reports the following medications:  Current Outpatient Medications  Medication Sig Dispense Refill   Na Sulfate-K Sulfate-Mg Sulf 17.5-3.13-1.6 GM/177ML SOLN Take 1 kit by mouth once for 1 dose. 354 mL 0   aspirin EC 81 MG tablet Take 81 mg by mouth daily. Swallow whole.     DILT-XR 180 MG 24 hr capsule TAKE 1 CAPSULE BY MOUTH  DAILY 90 capsule 3   estradiol (VIVELLE-DOT) 0.0375 MG/24HR Place 1 patch onto the skin 2 (two) times a week. 24 patch 3   gabapentin (NEURONTIN) 100 MG capsule TAKE 1 TO 3 CAPSULES BY MOUTH AT BEDTIME 90 capsule 3   glucose blood (ONETOUCH VERIO) test strip Use to check blood sugar once a day 100 each 4   levothyroxine (SYNTHROID) 100 MCG tablet TAKE 1 TABLET BY MOUTH DAILY 90 tablet 3   losartan-hydrochlorothiazide (HYZAAR) 100-25 MG tablet TAKE 1 TABLET BY MOUTH EVERY DAY 90 tablet 3   metFORMIN (GLUCOPHAGE-XR) 500 MG 24 hr tablet TAKE 2 TABLETS(1000 MG) BY MOUTH DAILY WITH BREAKFAST 180 tablet 3   omeprazole (PRILOSEC) 20 MG capsule TAKE ONE CAPSULE BY MOUTH DAILY 180 capsule 3   rosuvastatin (CRESTOR) 5 MG tablet Take 1 tablet (5 mg total) by mouth daily. 90 tablet 3   No  current facility-administered medications for this visit.    Patient confirms/reports the following allergies:  Allergies  Allergen Reactions   Norvasc [Amlodipine Besylate] Other (See Comments)    dizziness    Orders Placed This Encounter  Procedures   Ambulatory referral to Gastroenterology    Referral Priority:   Routine    Referral Type:   Consultation    Referral Reason:   Specialty Services Required    Referred to Provider:   Wyline Mood, MD    Number of Visits Requested:   1    AUTHORIZATION INFORMATION Primary Insurance: 1D#: Group #:  Secondary Insurance: 1D#: Group #:  SCHEDULE INFORMATION: Date: 10/20/2022 Time: Location:  [facility]

## 2022-08-17 NOTE — Progress Notes (Unsigned)
    Abdurahman Rugg T. Hobert Poplaski, MD, CAQ Sports Medicine [facility] [address] [place], [address]  Phone: [phone]  FAX: [phone]  MAIE MYNATT - 52 y.o. female  MRN [id number]  Date of Birth: Nov 23, 1970  Date: 08/18/2022  PCP: Karie Schwalbe, MD  Referral: Karie Schwalbe, MD  No chief complaint on file.  Subjective:   Kristina Gardner is a 52 y.o. very pleasant female patient with There is no height or weight on file to calculate BMI. who presents with the following:  Patient presents with some ongoing knee pain.  She reports having had an injury roughly 1 month ago.  She did see her primary care doctor Dr. Alphonsus Sias, and prior to this she went to the ER with knee pain.  She has been taking some meloxicam as well as some ibuprofen for pain.  Initially, she did not think that the meloxicam helped all that much.    Review of Systems is noted in the HPI, as appropriate  Objective:   There were no vitals taken for this visit.  GEN: No acute distress; alert,appropriate. PULM: Breathing comfortably in no respiratory distress PSYCH: Normally interactive.   Laboratory and Imaging Data:  Assessment and Plan:   ***

## 2022-08-18 ENCOUNTER — Ambulatory Visit: Payer: 59 | Admitting: Family Medicine

## 2022-08-18 ENCOUNTER — Encounter: Payer: Self-pay | Admitting: Family Medicine

## 2022-08-18 VITALS — BP 130/88 | HR 87 | Temp 97.6°F | Ht 67.0 in | Wt 199.5 lb

## 2022-08-18 DIAGNOSIS — S86112A Strain of other muscle(s) and tendon(s) of posterior muscle group at lower leg level, left leg, initial encounter: Secondary | ICD-10-CM

## 2022-08-18 DIAGNOSIS — M25562 Pain in left knee: Secondary | ICD-10-CM

## 2022-08-18 DIAGNOSIS — S76312A Strain of muscle, fascia and tendon of the posterior muscle group at thigh level, left thigh, initial encounter: Secondary | ICD-10-CM | POA: Diagnosis not present

## 2022-08-18 MED ORDER — PREDNISONE 20 MG PO TABS: ORAL_TABLET | ORAL | 0 refills | Status: DC

## 2022-08-18 NOTE — Patient Instructions (Signed)
Hamstring Rehab °1)  HS curls - start with 3 sets of 15 and progress to 3 sets of 30 every 3 days ° °2)  HS curls with weight - begin with 2 lb ankle weight when above is easy and start with 3 sets of 10; increasing every 5 days by 5 reps - eg to 3 sets of 15 reps ° °3)  HS swings - swing leg backwards and curl at the end of the swing; follow same schedule as above. ° °4)  HS running lunges - running lunge position means no more than 45 degrees of knee flexion and running motion.  follow same schedule as above.  °

## 2022-08-19 ENCOUNTER — Encounter: Payer: Self-pay | Admitting: Family Medicine

## 2022-09-01 ENCOUNTER — Ambulatory Visit
Admission: RE | Admit: 2022-09-01 | Discharge: 2022-09-01 | Disposition: A | Discharge status: Home / Self Care | Payer: 59 | Source: Ambulatory Visit | Attending: Family Medicine | Admitting: Family Medicine

## 2022-09-01 DIAGNOSIS — Z1231 Encounter for screening mammogram for malignant neoplasm of breast: Secondary | ICD-10-CM | POA: Diagnosis present

## 2022-09-02 ENCOUNTER — Inpatient Hospital Stay
Admission: RE | Admit: 2022-09-02 | Discharge: 2022-09-02 | Disposition: A | Discharge status: Home / Self Care | Payer: Self-pay | Source: Ambulatory Visit | Attending: Internal Medicine | Admitting: Internal Medicine

## 2022-09-02 ENCOUNTER — Other Ambulatory Visit: Payer: Self-pay | Admitting: *Deleted

## 2022-09-02 DIAGNOSIS — Z1231 Encounter for screening mammogram for malignant neoplasm of breast: Secondary | ICD-10-CM

## 2022-09-16 ENCOUNTER — Telehealth: Payer: Self-pay | Admitting: Internal Medicine

## 2022-09-16 MED ORDER — OMEPRAZOLE 20 MG PO CPDR: 20.0000 mg | DELAYED_RELEASE_CAPSULE | Freq: Every day | ORAL | 2 refills | Status: DC

## 2022-09-16 NOTE — Telephone Encounter (Signed)
Prescription Request  09/16/2022  LOV: 07/23/2022  What is the name of the medication or equipment? omeprazole (PRILOSEC) 20 MG capsule   Have you contacted your pharmacy to request a refill? Yes   Which pharmacy would you like this sent to?   [id number] [place], [place] - [address] AT [facility] OF [address] [place] [address] Phone: [phone] Fax: [phone]   Patient notified that their request is being sent to the clinical staff for review and that they should receive a response within 2 business days.   Please advise at Mobile [phone] (mobile)

## 2022-09-16 NOTE — Telephone Encounter (Signed)
Rx sent to pharmacy per patient's request. Patient notified by telephone that refill has been sent to the pharmacy.

## 2022-09-24 ENCOUNTER — Other Ambulatory Visit: Payer: Self-pay | Admitting: Internal Medicine

## 2022-10-02 ENCOUNTER — Encounter: Payer: Self-pay | Admitting: Internal Medicine

## 2022-10-06 ENCOUNTER — Encounter: Payer: Self-pay | Admitting: Family

## 2022-10-06 ENCOUNTER — Ambulatory Visit: Payer: 59 | Admitting: Family

## 2022-10-06 VITALS — BP 126/80 | HR 87 | Temp 97.8°F | Ht 67.0 in | Wt 203.8 lb

## 2022-10-06 DIAGNOSIS — W57XXXA Bitten or stung by nonvenomous insect and other nonvenomous arthropods, initial encounter: Secondary | ICD-10-CM | POA: Diagnosis not present

## 2022-10-06 DIAGNOSIS — S30867A Insect bite (nonvenomous) of anus, initial encounter: Secondary | ICD-10-CM

## 2022-10-06 NOTE — Assessment & Plan Note (Signed)
Advised pt low likelihood of lymes Too late for prophylaxis but also does not meet criteria for tick attached > 36 hours, was not engorged and not within 72 hour time period either.  Advised to monitor site for s/s infection Monitor for bulls eye although likelihood not likely

## 2022-10-06 NOTE — Progress Notes (Signed)
   Established Patient Office Visit  Subjective:   Patient ID: Kristina Gardner, female    DOB: 09-13-1970  Age: 52 y.o. MRN: [id number]  CC:  Chief Complaint  Patient presents with   Tick Removal    Tick was removed from her back.    HPI: Kristina Gardner is a 52 y.o. female presenting on 10/06/2022 for Tick Removal (Tick was removed from her back.)  HPI  Two weeks ago was bit by a tick, was on her back, lower back. She states didn't look like it was engorged was a small deer tick.   She does state at current there is a itchy little bump on her lower back.  She denies any other rash. And state was not attached for > 36 hours She does fatigue but states this is normal for her as she does work night shift.  She denies neuralgias.       ROS: Negative unless specifically indicated above in HPI.   Relevant past medical history reviewed and updated as indicated.   Allergies and medications reviewed and updated.   Current Outpatient Medications:    aspirin EC 81 MG tablet, Take 81 mg by mouth daily. Swallow whole., Disp: , Rfl:    DILT-XR 180 MG 24 hr capsule, TAKE 1 CAPSULE BY MOUTH  DAILY, Disp: 90 capsule, Rfl: 3   estradiol (VIVELLE-DOT) 0.0375 MG/24HR, Place 1 patch onto the skin 2 (two) times a week., Disp: 24 patch, Rfl: 3   gabapentin (NEURONTIN) 100 MG capsule, TAKE 1 TO 3 CAPSULES BY MOUTH AT BEDTIME, Disp: 90 capsule, Rfl: 3   glucose blood (ONETOUCH VERIO) test strip, Use to check blood sugar once a day, Disp: 100 each, Rfl: 4   levothyroxine (SYNTHROID) 100 MCG tablet, TAKE 1 TABLET BY MOUTH DAILY, Disp: 90 tablet, Rfl: 3   losartan-hydrochlorothiazide (HYZAAR) 100-25 MG tablet, TAKE 1 TABLET BY MOUTH EVERY DAY, Disp: 90 tablet, Rfl: 3   metFORMIN (GLUCOPHAGE-XR) 500 MG 24 hr tablet, TAKE 2 TABLETS(1000 MG) BY MOUTH DAILY WITH BREAKFAST, Disp: 180 tablet, Rfl: 3   omeprazole (PRILOSEC) 20 MG capsule, Take 1 capsule (20 mg total) by mouth daily., Disp: 180  capsule, Rfl: 2   rosuvastatin (CRESTOR) 5 MG tablet, Take 1 tablet (5 mg total) by mouth daily., Disp: 90 tablet, Rfl: 3  Allergies  Allergen Reactions   Norvasc [Amlodipine Besylate] Other (See Comments)    dizziness    Objective:   BP 126/80 (BP Location: Right Arm)   Pulse 87   Temp 97.8 F (36.6 C) (Temporal)   Ht 5\' 7"  (1.702 m)   Wt 203 lb 12.8 oz (92.4 kg)   SpO2 98%   BMI 31.92 kg/m    Physical Exam Skin:    Comments: Small papular lesion not erythematic no tender no induration on left lower back. Pinpoint.      Assessment & Plan:  Tick bite, unspecified site, initial encounter Assessment & Plan: Advised pt low likelihood of lymes Too late for prophylaxis but also does not meet criteria for tick attached > 36 hours, was not engorged and not within 72 hour time period either.  Advised to monitor site for s/s infection Monitor for bulls eye although likelihood not likely   Orders: -     B. burgdorfi antibodies by WB; Future     Follow up plan: Return for f/u PCP if no improvement in symptoms.  Mort Sawyers, FNP

## 2022-10-13 ENCOUNTER — Encounter: Payer: Self-pay | Admitting: Gastroenterology

## 2022-10-16 ENCOUNTER — Encounter: Payer: Self-pay | Admitting: Internal Medicine

## 2022-10-16 DIAGNOSIS — M170 Bilateral primary osteoarthritis of knee: Secondary | ICD-10-CM

## 2022-10-20 ENCOUNTER — Encounter
Admission: RE | Disposition: A | Discharge status: Home / Self Care | Payer: Self-pay | Source: Ambulatory Visit | Attending: Gastroenterology

## 2022-10-20 ENCOUNTER — Ambulatory Visit
Admission: RE | Admit: 2022-10-20 | Discharge: 2022-10-20 | Disposition: A | Discharge status: Home / Self Care | Payer: 59 | Source: Ambulatory Visit | Attending: Gastroenterology | Admitting: Gastroenterology

## 2022-10-20 ENCOUNTER — Ambulatory Visit: Payer: 59 | Admitting: Anesthesiology

## 2022-10-20 ENCOUNTER — Encounter: Payer: Self-pay | Admitting: Gastroenterology

## 2022-10-20 ENCOUNTER — Other Ambulatory Visit: Payer: Self-pay

## 2022-10-20 DIAGNOSIS — Z1211 Encounter for screening for malignant neoplasm of colon: Secondary | ICD-10-CM | POA: Diagnosis present

## 2022-10-20 DIAGNOSIS — E039 Hypothyroidism, unspecified: Secondary | ICD-10-CM | POA: Diagnosis not present

## 2022-10-20 DIAGNOSIS — I10 Essential (primary) hypertension: Secondary | ICD-10-CM | POA: Insufficient documentation

## 2022-10-20 DIAGNOSIS — K219 Gastro-esophageal reflux disease without esophagitis: Secondary | ICD-10-CM | POA: Insufficient documentation

## 2022-10-20 DIAGNOSIS — E119 Type 2 diabetes mellitus without complications: Secondary | ICD-10-CM | POA: Diagnosis not present

## 2022-10-20 DIAGNOSIS — Z860100 Personal history of colon polyps, unspecified: Secondary | ICD-10-CM

## 2022-10-20 HISTORY — PX: COLONOSCOPY WITH PROPOFOL: SHX5780

## 2022-10-20 LAB — POCT PREGNANCY, URINE: Preg Test, Ur: NEGATIVE

## 2022-10-20 LAB — GLUCOSE, CAPILLARY: Glucose-Capillary: 123 mg/dL — ABNORMAL HIGH (ref 70–99)

## 2022-10-20 SURGERY — COLONOSCOPY WITH PROPOFOL
Anesthesia: General

## 2022-10-20 MED ORDER — PROPOFOL 10 MG/ML IV BOLUS
INTRAVENOUS | Status: DC | PRN
  Administered 2022-10-20: 80 mg via INTRAVENOUS

## 2022-10-20 MED ORDER — PROPOFOL 1000 MG/100ML IV EMUL
INTRAVENOUS | Status: AC
  Filled 2022-10-20: qty 100

## 2022-10-20 MED ORDER — DEXMEDETOMIDINE HCL IN NACL 200 MCG/50ML IV SOLN
INTRAVENOUS | Status: DC | PRN
  Administered 2022-10-20: 8 ug via INTRAVENOUS

## 2022-10-20 MED ORDER — LIDOCAINE HCL (CARDIAC) PF 100 MG/5ML IV SOSY
PREFILLED_SYRINGE | INTRAVENOUS | Status: DC | PRN
  Administered 2022-10-20: 40 mg via INTRAVENOUS

## 2022-10-20 MED ORDER — SODIUM CHLORIDE 0.9 % IV SOLN: INTRAVENOUS | Status: DC

## 2022-10-20 MED ORDER — PROPOFOL 500 MG/50ML IV EMUL
INTRAVENOUS | Status: DC | PRN
  Administered 2022-10-20: 150 ug/kg/min via INTRAVENOUS

## 2022-10-20 NOTE — Op Note (Signed)
[facility] Patient Name: Kristina Gardner Procedure Date: 10/20/2022 8:07 AM MRN: [id number] Account #: [id number] Date of Birth: [birth year] Admit Type: Outpatient Age: 52 Room: [facility] ENDO ROOM 1 Gender: Female Note Status: Finalized Instrument Name: Prentice Docker [id number] Procedure:             Colonoscopy Indications:           Surveillance: Personal history of adenomatous polyps                         on last colonoscopy 3 years ago, Last colonoscopy:                         June 2021 Providers:             Wyline Mood MD, MD Referring MD:          Karie Schwalbe (Referring MD) Medicines:             Monitored Anesthesia Care Complications:         No immediate complications. Procedure:             Pre-Anesthesia Assessment:                        - Prior to the procedure, a History and Physical was                         performed, and patient medications, allergies and                         sensitivities were reviewed. The patient's tolerance                         of previous anesthesia was reviewed.                        - The risks and benefits of the procedure and the                         sedation options and risks were discussed with the                         patient. All questions were answered and informed                         consent was obtained.                        - ASA Grade Assessment: II - A patient with mild                         systemic disease.                        After obtaining informed consent, the colonoscope was                         passed under direct vision. Throughout the procedure,                         the patient's blood pressure,  pulse, and oxygen                         saturations were monitored continuously. The                         Colonoscope was introduced through the anus with the                         intention of advancing to the cecum. The scope was                          advanced to the sigmoid colon before the procedure was                         aborted. Medications were given. The colonoscopy was                         performed with ease. The patient tolerated the                         procedure well. The quality of the bowel preparation                         was inadequate. Findings:      The perianal and digital rectal examinations were normal.      A large amount of semi-liquid stool was found in the rectum and in the       sigmoid colon, interfering with visualization. Impression:            - Preparation of the colon was inadequate.                        - Stool in the rectum and in the sigmoid colon.                        - No specimens collected. Recommendation:        - Discharge patient to home (with escort).                        - Resume previous diet.                        - Continue present medications.                        - Repeat colonoscopy in 4 months because the bowel                         preparation was suboptimal. Procedure Code(s):     --- Professional ---                        [phone], 53, Colonoscopy, flexible; diagnostic,                         including collection of specimen(s) by brushing or                         washing, when performed (separate procedure) Diagnosis  Code(s):     --- Professional ---                        Z86.010, Personal history of colonic polyps CPT copyright 2022 American Medical Association. All rights reserved. The codes documented in this report are preliminary and upon coder review may  be revised to meet current compliance requirements. Wyline Mood, MD Wyline Mood MD, MD 10/20/2022 8:13:32 AM This report has been signed electronically. Number of Addenda: 0 Note Initiated On: 10/20/2022 8:07 AM Total Procedure Duration: 0 hours 0 minutes 32 seconds  Estimated Blood Loss:  Estimated blood loss: none.      [facility]

## 2022-10-20 NOTE — H&P (Signed)
Wyline Mood, MD [address], [place], [place], [address] [address], [address], [place], [place], [address] Phone: [phone]  Fax: [phone]  Primary Care Physician:  Karie Schwalbe, MD   Pre-Procedure History & Physical: HPI:  Kristina Gardner is a 52 y.o. female is here for an colonoscopy.   Past Medical History:  Diagnosis Date   Diabetes mellitus without complication (HCC)    Dysrhythmia    GERD (gastroesophageal reflux disease)    Hypertension    Leiomyoma of uterus, unspecified    Obesity    Pneumonia    Unspecified hypothyroidism     Past Surgical History:  Procedure Laterality Date   COLONOSCOPY WITH PROPOFOL N/A 08/15/2019   Procedure: COLONOSCOPY WITH PROPOFOL;  Surgeon: Wyline Mood, MD;  Location: [facility] ENDOSCOPY;  Service: Gastroenterology;  Laterality: N/A;   COLONOSCOPY WITH PROPOFOL N/A 10/06/2019   Procedure: COLONOSCOPY WITH PROPOFOL;  Surgeon: Wyline Mood, MD;  Location: [facility] ENDOSCOPY;  Service: Gastroenterology;  Laterality: N/A;   DILATION AND CURETTAGE OF UTERUS  2000    Prior to Admission medications   Medication Sig Start Date End Date Taking? Authorizing Provider  aspirin EC 81 MG tablet Take 81 mg by mouth daily. Swallow whole.   Yes [provider]  DILT-XR 180 MG 24 hr capsule TAKE 1 CAPSULE BY MOUTH  DAILY 01/15/22  Yes Karie Schwalbe, MD  estradiol (VIVELLE-DOT) 0.0375 MG/24HR Place 1 patch onto the skin 2 (two) times a week. 07/17/22  Yes Reva Bores, MD  gabapentin (NEURONTIN) 100 MG capsule TAKE 1 TO 3 CAPSULES BY MOUTH AT BEDTIME 07/16/22  Yes Karie Schwalbe, MD  levothyroxine (SYNTHROID) 100 MCG tablet TAKE 1 TABLET BY MOUTH DAILY 05/21/22  Yes Karie Schwalbe, MD  losartan-hydrochlorothiazide (HYZAAR) 100-25 MG tablet TAKE 1 TABLET BY MOUTH EVERY DAY 09/24/22  Yes Karie Schwalbe, MD  omeprazole (PRILOSEC) 20 MG capsule Take 1 capsule (20 mg total) by mouth daily. 09/16/22  Yes Karie Schwalbe, MD   rosuvastatin (CRESTOR) 5 MG tablet Take 1 tablet (5 mg total) by mouth daily. 06/06/22  Yes Tillman Abide I, MD  glucose blood (ONETOUCH VERIO) test strip Use to check blood sugar once a day 07/17/20   Karie Schwalbe, MD  metFORMIN (GLUCOPHAGE-XR) 500 MG 24 hr tablet TAKE 2 TABLETS(1000 MG) BY MOUTH DAILY WITH BREAKFAST 07/16/22   Karie Schwalbe, MD    Allergies as of 08/14/2022 - Review Complete 07/23/2022  Allergen Reaction Noted   Norvasc [amlodipine besylate] Other (See Comments) 12/11/2014    Family History  Problem Relation Age of Onset   Hypertension Mother    Cancer Mother        Bone Marrow    Kidney disease Mother    Arthritis Sister    Cancer Paternal Grandfather        lung cancer   Diabetes Father    Cancer Maternal Aunt        from snuff   Dementia Maternal Aunt    Bladder Cancer Maternal Aunt    Graves' disease Other        multiple    Social History   Socioeconomic History   Marital status: Single    Spouse name: Not on file   Number of children: 1   Years of education: Not on file   Highest education level: Not on file  Occupational History   Occupation: [employer], customer service  Tobacco Use   Smoking status: Never  Passive exposure: Never   Smokeless tobacco: Never  Vaping Use   Vaping Use: Never used  Substance and Sexual Activity   Alcohol use: Yes    Comment: occasional,none last 24hrs   Drug use: No   Sexual activity: Yes    Partners: Male    Birth control/protection: Condom, I.U.D.  Other Topics Concern   Not on file  Social History Narrative   1 daughter   Social Determinants of Health   Financial Resource Strain: Not on file  Food Insecurity: Not on file  Transportation Needs: Not on file  Physical Activity: Not on file  Stress: Not on file  Social Connections: Not on file  Intimate Partner Violence: Not on file    Review of Systems: See HPI, otherwise negative ROS  Physical Exam: BP (!) 151/111   Pulse (!) 109    Temp (!) 96.9 F (36.1 C) (Temporal)   Resp 18   Ht 5\' 7"  (1.702 m)   Wt 89.1 kg   SpO2 96%   BMI 30.76 kg/m  General:   Alert,  pleasant and cooperative in NAD Head:  Normocephalic and atraumatic. Neck:  Supple; no masses or thyromegaly. Lungs:  Clear throughout to auscultation, normal respiratory effort.    Heart:  +S1, +S2, Regular rate and rhythm, No edema. Abdomen:  Soft, nontender and nondistended. Normal bowel sounds, without guarding, and without rebound.   Neurologic:  Alert and  oriented x4;  grossly normal neurologically.  Impression/Plan: Kristina Gardner is here for an colonoscopy to be performed for surveillance due to prior history of colon polyps   Risks, benefits, limitations, and alternatives regarding  colonoscopy have been reviewed with the patient.  Questions have been answered.  All parties agreeable.   Wyline Mood, MD  10/20/2022, 8:07 AM

## 2022-10-20 NOTE — Transfer of Care (Signed)
Immediate Anesthesia Transfer of Care Note  Patient: LAVAYA SIT  Procedure(s) Performed: Procedure(s): COLONOSCOPY WITH PROPOFOL (N/A)  Patient Location: PACU and Endoscopy Unit  Anesthesia Type:General  Level of Consciousness: sedated  Airway & Oxygen Therapy: Patient Spontanous Breathing and Patient connected to nasal cannula oxygen  Post-op Assessment: Report given to RN and Post -op Vital signs reviewed and stable  Post vital signs: Reviewed and stable  Last Vitals:  Vitals:   10/20/22 0739 10/20/22 0816  BP: (!) 151/111 (!) 89/62  Pulse: (!) 109   Resp: 18   Temp: (!) 36.1 C (!) 36 C  SpO2: 96%     Complications: No apparent anesthesia complications

## 2022-10-20 NOTE — Anesthesia Postprocedure Evaluation (Signed)
Anesthesia Post Note  Patient: Kristina Gardner  Procedure(s) Performed: COLONOSCOPY WITH PROPOFOL  Patient location during evaluation: PACU Anesthesia Type: General Level of consciousness: awake and alert, oriented and patient cooperative Pain management: pain level controlled Vital Signs Assessment: post-procedure vital signs reviewed and stable Respiratory status: spontaneous breathing, nonlabored ventilation and respiratory function stable Cardiovascular status: blood pressure returned to baseline and stable Postop Assessment: adequate PO intake Anesthetic complications: no   No notable events documented.   Last Vitals:  Vitals:   10/20/22 0815 10/20/22 0816  BP:  (!) 89/62  Pulse:    Resp:    Temp: (!) 36 C (!) 36 C  SpO2:      Last Pain:  Vitals:   10/20/22 0835  TempSrc:   PainSc: 0-No pain                 Reed Breech

## 2022-10-20 NOTE — Anesthesia Preprocedure Evaluation (Addendum)
Anesthesia Evaluation  Patient identified by MRN, date of birth, ID band Patient awake    Reviewed: Allergy & Precautions, NPO status , Patient's Chart, lab work & pertinent test results  History of Anesthesia Complications Negative for: history of anesthetic complications  Airway Mallampati: IV   Neck ROM: Full    Dental no notable dental hx.    Pulmonary neg pulmonary ROS   Pulmonary exam normal breath sounds clear to auscultation       Cardiovascular hypertension, Normal cardiovascular exam Rhythm:Regular Rate:Normal  ECG 05/20/22: normal   Neuro/Psych negative neurological ROS     GI/Hepatic ,GERD  ,,  Endo/Other  diabetes, Type 2Hypothyroidism  Obesity   Renal/GU negative Renal ROS     Musculoskeletal   Abdominal   Peds  Hematology negative hematology ROS (+)   Anesthesia Other Findings   Reproductive/Obstetrics                             Anesthesia Physical Anesthesia Plan  ASA: 2  Anesthesia Plan: General   Post-op Pain Management:    Induction: Intravenous  PONV Risk Score and Plan: 3 and Propofol infusion, TIVA and Treatment may vary due to age or medical condition  Airway Management Planned: Natural Airway  Additional Equipment:   Intra-op Plan:   Post-operative Plan:   Informed Consent: I have reviewed the patients History and Physical, chart, labs and discussed the procedure including the risks, benefits and alternatives for the proposed anesthesia with the patient or authorized representative who has indicated his/her understanding and acceptance.       Plan Discussed with: CRNA  Anesthesia Plan Comments: (LMA/GETA backup discussed.  Patient consented for risks of anesthesia including but not limited to:  - adverse reactions to medications - damage to eyes, teeth, lips or other oral mucosa - nerve damage due to positioning  - sore throat or  hoarseness - damage to heart, brain, nerves, lungs, other parts of body or loss of life  Informed patient about role of CRNA in peri- and intra-operative care.  Patient voiced understanding.)        Anesthesia Quick Evaluation

## 2022-10-20 NOTE — Progress Notes (Signed)
Colonoscopy aborted due to brown watery stool in colon.

## 2022-10-21 ENCOUNTER — Encounter: Payer: Self-pay | Admitting: Gastroenterology

## 2022-10-30 ENCOUNTER — Encounter: Payer: Self-pay | Admitting: Orthopedic Surgery

## 2022-10-30 ENCOUNTER — Ambulatory Visit: Payer: 59 | Admitting: Orthopedic Surgery

## 2022-10-30 ENCOUNTER — Other Ambulatory Visit (INDEPENDENT_AMBULATORY_CARE_PROVIDER_SITE_OTHER): Payer: 59

## 2022-10-30 DIAGNOSIS — M25561 Pain in right knee: Secondary | ICD-10-CM | POA: Diagnosis not present

## 2022-10-30 DIAGNOSIS — M25562 Pain in left knee: Secondary | ICD-10-CM | POA: Diagnosis not present

## 2022-10-30 DIAGNOSIS — M25362 Other instability, left knee: Secondary | ICD-10-CM | POA: Diagnosis not present

## 2022-10-30 NOTE — Progress Notes (Signed)
Office Visit Note   Patient: Kristina Gardner           Date of Birth: December 21, 1970           MRN: [id number] Visit Date: 10/30/2022 Requested by: Karie Schwalbe, MD [address] [place],  [place] [address] PCP: Karie Schwalbe, MD  Subjective: Chief Complaint  Patient presents with   Right Knee - Pain   Left Knee - Pain    HPI: Kristina Gardner is a 52 y.o. female who presents to the office reporting bilateral knee pain left worse than right.  She was caring for her sister who is handicapped when she sustained a twisting injury to the knee in March and felt a pop in the left knee.  Describes swelling with some giving way and popping as well as pain that wakes her from sleep at night.  Takes Advil with good relief but it does not help with stiffness.  She does desk work.  Has been using a brace.  Has tried some rehabilitative exercises over the past several months without relief.  She does walk and developed swelling when she walks.  Difficult for her to take the stairs.  Medial pain is greater than lateral sided pain.  Going on a cruise into the beach in August and would like to be feeling somewhat better for that then she is now..                ROS: All systems reviewed are negative as they relate to the chief complaint within the history of present illness.  Patient denies fevers or chills.  Assessment & Plan: Visit Diagnoses:  1. Pain in both knees, unspecified chronicity   2. Knee instability, left     Plan: Impression is medial sided left knee pain following twisting injury with pop which statistically represents medial meniscal tearing.  She has failed conservative management and treatment.  Plan at this time is MRI scan of the left knee to evaluate medial meniscal tear.  Follow-up after that study.  May consider knee injection prior to her trips in August with possible treatment of the meniscal pathology when she returns.  Follow-Up Instructions: No follow-ups  on file.   Orders:  Orders Placed This Encounter  Procedures   XR KNEE 3 VIEW RIGHT   MR Knee Left w/o contrast   No orders of the defined types were placed in this encounter.     Procedures: No procedures performed   Clinical Data: No additional findings.  Objective: Vital Signs: There were no vitals taken for this visit.  Physical Exam:  Constitutional: Patient appears well-developed HEENT:  Head: Normocephalic Eyes:EOM are normal Neck: Normal range of motion Cardiovascular: Normal rate Pulmonary/chest: Effort normal Neurologic: Patient is alert Skin: Skin is warm Psychiatric: Patient has normal mood and affect  Ortho Exam: Ortho exam demonstrates positive Murray compression testing on the left for medial compartment pathology.  Has medial joint line tenderness more on the left than right.  Full range of motion is present.  Trace effusion left no effusion right.  Extensor mechanism intact.  Collateral and cruciate ligaments are stable.  Pedal pulses palpable.  Specialty Comments:  No specialty comments available.  Imaging: XR KNEE 3 VIEW RIGHT  Result Date: 10/30/2022 AP lateral merchant radiographs right knee reviewed.  No arthritis.  Alignment intact.  Patella well centered in the trochlear groove.  No fracture.  AP and merchant view of the left knee also demonstrate  no arthritis no spurring no fracture good alignment.    PMFS History: Patient Active Problem List   Diagnosis Date Noted   Personal history of colonic polyps 10/20/2022   Tick bite 10/06/2022   Knee strain, left, initial encounter 07/23/2022   Chest pain of uncertain etiology 05/20/2022   Finger mass, right 12/05/2021   Synovial cyst 12/03/2021   Menopausal symptoms 05/14/2021   Type 2 diabetes mellitus with other circulatory complications (HCC) 06/26/2020   Cold induced bronchospasm 02/03/2020   GERD (gastroesophageal reflux disease)    Hyperlipidemia 05/15/2017   Obesity 02/07/2014    Routine general medical examination at a health care facility 07/25/2011   Hypothyroidism 09/07/2009   Leiomyoma of uterus 10/18/2008   Essential hypertension, benign 07/22/2006   Past Medical History:  Diagnosis Date   Diabetes mellitus without complication (HCC)    Dysrhythmia    GERD (gastroesophageal reflux disease)    Hypertension    Leiomyoma of uterus, unspecified    Obesity    Pneumonia    Unspecified hypothyroidism     Family History  Problem Relation Age of Onset   Hypertension Mother    Cancer Mother        Bone Marrow    Kidney disease Mother    Arthritis Sister    Cancer Paternal Grandfather        lung cancer   Diabetes Father    Cancer Maternal Aunt        from snuff   Dementia Maternal Aunt    Bladder Cancer Maternal Aunt    Graves' disease Other        multiple    Past Surgical History:  Procedure Laterality Date   COLONOSCOPY WITH PROPOFOL N/A 08/15/2019   Procedure: COLONOSCOPY WITH PROPOFOL;  Surgeon: Wyline Mood, MD;  Location: [facility] ENDOSCOPY;  Service: Gastroenterology;  Laterality: N/A;   COLONOSCOPY WITH PROPOFOL N/A 10/06/2019   Procedure: COLONOSCOPY WITH PROPOFOL;  Surgeon: Wyline Mood, MD;  Location: [facility] ENDOSCOPY;  Service: Gastroenterology;  Laterality: N/A;   COLONOSCOPY WITH PROPOFOL N/A 10/20/2022   Procedure: COLONOSCOPY WITH PROPOFOL;  Surgeon: Wyline Mood, MD;  Location: [facility] ENDOSCOPY;  Service: Gastroenterology;  Laterality: N/A;   DILATION AND CURETTAGE OF UTERUS  2000   Social History   Occupational History   Occupation: [employer], Clinical biochemist  Tobacco Use   Smoking status: Never    Passive exposure: Never   Smokeless tobacco: Never  Vaping Use   Vaping Use: Never used  Substance and Sexual Activity   Alcohol use: Yes    Comment: occasional,none last 24hrs   Drug use: No   Sexual activity: Yes    Partners: Male    Birth control/protection: Condom, I.U.D.

## 2022-11-03 ENCOUNTER — Ambulatory Visit
Admission: RE | Admit: 2022-11-03 | Discharge: 2022-11-03 | Disposition: A | Discharge status: Home / Self Care | Payer: 59 | Source: Ambulatory Visit | Attending: Orthopedic Surgery | Admitting: Orthopedic Surgery

## 2022-11-03 DIAGNOSIS — M25362 Other instability, left knee: Secondary | ICD-10-CM

## 2022-11-10 ENCOUNTER — Encounter: Payer: Self-pay | Admitting: Orthopedic Surgery

## 2022-11-10 ENCOUNTER — Ambulatory Visit: Payer: 59 | Admitting: Orthopedic Surgery

## 2022-11-10 ENCOUNTER — Telehealth: Payer: Self-pay

## 2022-11-10 DIAGNOSIS — M25362 Other instability, left knee: Secondary | ICD-10-CM | POA: Diagnosis not present

## 2022-11-10 NOTE — Progress Notes (Unsigned)
Office Visit Note   Patient: Kristina Gardner           Date of Birth: [birth year]           MRN: [id number] Visit Date: 11/10/2022 Requested by: Karie Schwalbe, MD [address] [place],  [place] [address] PCP: Karie Schwalbe, MD  Subjective: Chief Complaint  Patient presents with   Other     Scan review    HPI: Kristina Gardner is a 52 y.o. female who presents to the office reporting left knee pain.  Since she was last seen she had an MRI scan of the left knee.  On my review of the scan she did have some signal in the menisci but they did not really reach the joint surface.  Mild arthritis was also present..                ROS: All systems reviewed are negative as they relate to the chief complaint within the history of present illness.  Patient denies fevers or chills.  Assessment & Plan: Visit Diagnoses:  1. Knee instability, left     Plan: Impression is left knee pain with twisting injury and signal in the meniscus which on my review does not really look like an unstable meniscal tear.  The official interpretation which came in a little later after her clinic visit did demonstrate meniscal horizontal cleavage tears.  I think this is something that with the absence of an effusion we can watch for now.  I do want to preapproved gel injection prior to her vacation on 12/14/2022.  Follow-Up Instructions: No follow-ups on file.   Orders:  No orders of the defined types were placed in this encounter.  No orders of the defined types were placed in this encounter.     Procedures: No procedures performed   Clinical Data: No additional findings.  Objective: Vital Signs: There were no vitals taken for this visit.  Physical Exam:  Constitutional: Patient appears well-developed HEENT:  Head: Normocephalic Eyes:EOM are normal Neck: Normal range of motion Cardiovascular: Normal rate Pulmonary/chest: Effort normal Neurologic: Patient is alert Skin: Skin  is warm Psychiatric: Patient has normal mood and affect  Ortho Exam: Ortho exam demonstrates pretty reasonable range of motion of that left knee.  Not too much joint line tenderness with no effusion.  Collateral crucial ligaments are stable.  Equivocal McMurray compression testing.  Specialty Comments:  No specialty comments available.  Imaging: No results found.   PMFS History: Patient Active Problem List   Diagnosis Date Noted   Personal history of colonic polyps 10/20/2022   Tick bite 10/06/2022   Knee strain, left, initial encounter 07/23/2022   Chest pain of uncertain etiology 05/20/2022   Finger mass, right 12/05/2021   Synovial cyst 12/03/2021   Menopausal symptoms 05/14/2021   Type 2 diabetes mellitus with other circulatory complications (HCC) 06/26/2020   Cold induced bronchospasm 02/03/2020   GERD (gastroesophageal reflux disease)    Hyperlipidemia 05/15/2017   Obesity 02/07/2014   Routine general medical examination at a health care facility 07/25/2011   Hypothyroidism 09/07/2009   Leiomyoma of uterus 10/18/2008   Essential hypertension, benign 07/22/2006   Past Medical History:  Diagnosis Date   Diabetes mellitus without complication (HCC)    Dysrhythmia    GERD (gastroesophageal reflux disease)    Hypertension    Leiomyoma of uterus, unspecified    Obesity    Pneumonia    Unspecified hypothyroidism  Family History  Problem Relation Age of Onset   Hypertension Mother    Cancer Mother        Bone Marrow    Kidney disease Mother    Arthritis Sister    Cancer Paternal Grandfather        lung cancer   Diabetes Father    Cancer Maternal Aunt        from snuff   Dementia Maternal Aunt    Bladder Cancer Maternal Aunt    Graves' disease Other        multiple    Past Surgical History:  Procedure Laterality Date   COLONOSCOPY WITH PROPOFOL N/A 08/15/2019   Procedure: COLONOSCOPY WITH PROPOFOL;  Surgeon: Wyline Mood, MD;  Location: [facility] ENDOSCOPY;   Service: Gastroenterology;  Laterality: N/A;   COLONOSCOPY WITH PROPOFOL N/A 10/06/2019   Procedure: COLONOSCOPY WITH PROPOFOL;  Surgeon: Wyline Mood, MD;  Location: [facility] ENDOSCOPY;  Service: Gastroenterology;  Laterality: N/A;   COLONOSCOPY WITH PROPOFOL N/A 10/20/2022   Procedure: COLONOSCOPY WITH PROPOFOL;  Surgeon: Wyline Mood, MD;  Location: [facility] ENDOSCOPY;  Service: Gastroenterology;  Laterality: N/A;   DILATION AND CURETTAGE OF UTERUS  2000   Social History   Occupational History   Occupation: [employer], Clinical biochemist  Tobacco Use   Smoking status: Never    Passive exposure: Never   Smokeless tobacco: Never  Vaping Use   Vaping Use: Never used  Substance and Sexual Activity   Alcohol use: Yes    Comment: occasional,none last 24hrs   Drug use: No   Sexual activity: Yes    Partners: Male    Birth control/protection: Condom, I.U.D.

## 2022-11-10 NOTE — Telephone Encounter (Signed)
Gel injection auth for left knee-Dr August Saucer wants approved for patient before she goes on vacation. He wants her to be able to get injection on 08/02 before she leaves on 08/11

## 2022-11-10 NOTE — Telephone Encounter (Signed)
VOB submitted for Durolane, left knee.  

## 2022-11-26 ENCOUNTER — Other Ambulatory Visit: Payer: Self-pay

## 2022-11-26 DIAGNOSIS — M25362 Other instability, left knee: Secondary | ICD-10-CM

## 2022-12-05 ENCOUNTER — Encounter: Payer: Self-pay | Admitting: Internal Medicine

## 2022-12-05 ENCOUNTER — Ambulatory Visit: Payer: 59 | Admitting: Internal Medicine

## 2022-12-05 ENCOUNTER — Ambulatory Visit: Payer: 59 | Admitting: Orthopedic Surgery

## 2022-12-05 ENCOUNTER — Encounter: Payer: Self-pay | Admitting: Orthopedic Surgery

## 2022-12-05 VITALS — BP 120/82 | HR 78 | Temp 97.5°F | Ht 67.0 in | Wt 199.0 lb

## 2022-12-05 DIAGNOSIS — I10 Essential (primary) hypertension: Secondary | ICD-10-CM

## 2022-12-05 DIAGNOSIS — Z7984 Long term (current) use of oral hypoglycemic drugs: Secondary | ICD-10-CM | POA: Diagnosis not present

## 2022-12-05 DIAGNOSIS — M1712 Unilateral primary osteoarthritis, left knee: Secondary | ICD-10-CM | POA: Diagnosis not present

## 2022-12-05 DIAGNOSIS — E1159 Type 2 diabetes mellitus with other circulatory complications: Secondary | ICD-10-CM | POA: Diagnosis not present

## 2022-12-05 LAB — POCT GLYCOSYLATED HEMOGLOBIN (HGB A1C): Hemoglobin A1C: 5.9 % — AB (ref 4.0–5.6)

## 2022-12-05 MED ORDER — SODIUM HYALURONATE 60 MG/3ML IX PRSY
60.0000 mg | PREFILLED_SYRINGE | INTRA_ARTICULAR | Status: AC | PRN
Start: 2022-12-05 — End: 2022-12-05
  Administered 2022-12-05: 60 mg via INTRA_ARTICULAR

## 2022-12-05 MED ORDER — LIDOCAINE HCL 1 % IJ SOLN
5.0000 mL | INTRAMUSCULAR | Status: AC | PRN
Start: 2022-12-05 — End: 2022-12-05
  Administered 2022-12-05: 5 mL

## 2022-12-05 NOTE — Assessment & Plan Note (Signed)
BP Readings from Last 3 Encounters:  12/05/22 120/82  10/20/22 (!) 89/62  10/06/22 126/80   Contorlled on the losartan/hydrochlorothiazide 100/25

## 2022-12-05 NOTE — Progress Notes (Signed)
   Procedure Note  Patient: Kristina Gardner             Date of Birth: 1970/06/18           MRN: [id number]             Visit Date: 12/05/2022  Procedures: Visit Diagnoses:  1. Arthritis of left knee     Large Joint Inj on 12/05/2022 7:41 PM Indications: diagnostic evaluation, joint swelling and pain Details: 18 G 1.5 in needle, superolateral approach  Arthrogram: No  Medications: 5 mL lidocaine 1 %; 60 mg Sodium Hyaluronate 60 MG/3ML Outcome: tolerated well, no immediate complications Procedure, treatment alternatives, risks and benefits explained, specific risks discussed. Consent was given by the patient. Immediately prior to procedure a time out was called to verify the correct patient, procedure, equipment, support staff and site/side marked as required. Patient was prepped and draped in the usual sterile fashion.

## 2022-12-05 NOTE — Assessment & Plan Note (Signed)
Lab Results  Component Value Date   HGBA1C 5.9 (A) 12/05/2022   Still excellent control on metformin 1000 daily

## 2022-12-05 NOTE — Progress Notes (Signed)
Subjective:    Patient ID: Kristina Gardner, female    DOB: 12-19-1970, 52 y.o.   MRN: [id number]  HPI Here for follow up of diabetes  Doing okay No new health concerns  Is getting gel injections in knee Has cruise next week  Rarely checking sugars--sometimes 2 hours after breakfast No foot numbness, tingling or burning (just some numbness/itching over left elbow (?work position)  No chest pain No SOB No dizziness or syncope  Current Outpatient Medications on File Prior to Visit  Medication Sig Dispense Refill   aspirin EC 81 MG tablet Take 81 mg by mouth daily. Swallow whole.     DILT-XR 180 MG 24 hr capsule TAKE 1 CAPSULE BY MOUTH  DAILY 90 capsule 3   estradiol (VIVELLE-DOT) 0.0375 MG/24HR Place 1 patch onto the skin 2 (two) times a week. 24 patch 3   gabapentin (NEURONTIN) 100 MG capsule TAKE 1 TO 3 CAPSULES BY MOUTH AT BEDTIME 90 capsule 3   glucose blood (ONETOUCH VERIO) test strip Use to check blood sugar once a day 100 each 4   levothyroxine (SYNTHROID) 100 MCG tablet TAKE 1 TABLET BY MOUTH DAILY 90 tablet 3   losartan-hydrochlorothiazide (HYZAAR) 100-25 MG tablet TAKE 1 TABLET BY MOUTH EVERY DAY 90 tablet 3   metFORMIN (GLUCOPHAGE-XR) 500 MG 24 hr tablet TAKE 2 TABLETS(1000 MG) BY MOUTH DAILY WITH BREAKFAST 180 tablet 3   omeprazole (PRILOSEC) 20 MG capsule Take 1 capsule (20 mg total) by mouth daily. 180 capsule 2   rosuvastatin (CRESTOR) 5 MG tablet Take 1 tablet (5 mg total) by mouth daily. 90 tablet 3   No current facility-administered medications on file prior to visit.    Allergies  Allergen Reactions   Norvasc [Amlodipine Besylate] Other (See Comments)    dizziness    Past Medical History:  Diagnosis Date   Diabetes mellitus without complication (HCC)    Dysrhythmia    GERD (gastroesophageal reflux disease)    Hypertension    Leiomyoma of uterus, unspecified    Obesity    Pneumonia    Unspecified hypothyroidism     Past Surgical History:   Procedure Laterality Date   COLONOSCOPY WITH PROPOFOL N/A 08/15/2019   Procedure: COLONOSCOPY WITH PROPOFOL;  Surgeon: Wyline Mood, MD;  Location: [facility] ENDOSCOPY;  Service: Gastroenterology;  Laterality: N/A;   COLONOSCOPY WITH PROPOFOL N/A 10/06/2019   Procedure: COLONOSCOPY WITH PROPOFOL;  Surgeon: Wyline Mood, MD;  Location: [facility] ENDOSCOPY;  Service: Gastroenterology;  Laterality: N/A;   COLONOSCOPY WITH PROPOFOL N/A 10/20/2022   Procedure: COLONOSCOPY WITH PROPOFOL;  Surgeon: Wyline Mood, MD;  Location: [facility] ENDOSCOPY;  Service: Gastroenterology;  Laterality: N/A;   DILATION AND CURETTAGE OF UTERUS  2000    Family History  Problem Relation Age of Onset   Hypertension Mother    Cancer Mother        Bone Marrow    Kidney disease Mother    Arthritis Sister    Cancer Paternal Grandfather        lung cancer   Diabetes Father    Cancer Maternal Aunt        from snuff   Dementia Maternal Aunt    Bladder Cancer Maternal Aunt    Graves' disease Other        multiple    Social History   Socioeconomic History   Marital status: Single    Spouse name: Not on file   Number of children: 1   Years of education: Not on  file   Highest education level: Not on file  Occupational History   Occupation: [employer], customer service  Tobacco Use   Smoking status: Never    Passive exposure: Never   Smokeless tobacco: Never  Vaping Use   Vaping status: Never Used  Substance and Sexual Activity   Alcohol use: Yes    Comment: occasional,none last 24hrs   Drug use: No   Sexual activity: Yes    Partners: Male    Birth control/protection: Condom, I.U.D.  Other Topics Concern   Not on file  Social History Narrative   1 daughter   Social Determinants of Health   Financial Resource Strain: Not on file  Food Insecurity: Not on file  Transportation Needs: Not on file  Physical Activity: Not on file  Stress: Not on file  Social Connections: Not on file  Intimate Partner Violence: Not on  file   Review of Systems Sleeps okay--uses gabapentin or melatonin Appetite is okay--fairly careful with eating     Objective:   Physical Exam Constitutional:      Appearance: Normal appearance.  Cardiovascular:     Rate and Rhythm: Normal rate and regular rhythm.     Pulses: Normal pulses.     Heart sounds: No murmur heard.    No gallop.  Pulmonary:     Effort: Pulmonary effort is normal.     Breath sounds: Normal breath sounds. No wheezing or rales.  Musculoskeletal:     Cervical back: Neck supple.     Right lower leg: No edema.     Left lower leg: No edema.  Lymphadenopathy:     Cervical: No cervical adenopathy.  Skin:    Comments: No foot lesions  Neurological:     Mental Status: She is alert.            Assessment & Plan:

## 2022-12-08 ENCOUNTER — Other Ambulatory Visit: Payer: Self-pay | Admitting: Internal Medicine

## 2022-12-11 ENCOUNTER — Encounter: Payer: Self-pay | Admitting: Internal Medicine

## 2022-12-11 MED ORDER — MECLIZINE HCL 25 MG PO TABS: 25.0000 mg | ORAL_TABLET | Freq: Three times a day (TID) | ORAL | 0 refills | Status: DC | PRN

## 2023-01-07 ENCOUNTER — Emergency Department: Payer: 59

## 2023-01-07 ENCOUNTER — Encounter: Payer: Self-pay | Admitting: Emergency Medicine

## 2023-01-07 DIAGNOSIS — I10 Essential (primary) hypertension: Secondary | ICD-10-CM | POA: Diagnosis not present

## 2023-01-07 DIAGNOSIS — E876 Hypokalemia: Secondary | ICD-10-CM | POA: Diagnosis not present

## 2023-01-07 DIAGNOSIS — R0789 Other chest pain: Secondary | ICD-10-CM | POA: Diagnosis not present

## 2023-01-07 DIAGNOSIS — E119 Type 2 diabetes mellitus without complications: Secondary | ICD-10-CM | POA: Diagnosis not present

## 2023-01-07 DIAGNOSIS — R079 Chest pain, unspecified: Secondary | ICD-10-CM | POA: Diagnosis present

## 2023-01-07 LAB — CBC
HCT: 47.6 % — ABNORMAL HIGH (ref 36.0–46.0)
Hemoglobin: 15.1 g/dL — ABNORMAL HIGH (ref 12.0–15.0)
MCH: 28 pg (ref 26.0–34.0)
MCHC: 31.7 g/dL (ref 30.0–36.0)
MCV: 88.1 fL (ref 80.0–100.0)
Platelets: 226 10*3/uL (ref 150–400)
RBC: 5.4 MIL/uL — ABNORMAL HIGH (ref 3.87–5.11)
RDW: 13.2 % (ref 11.5–15.5)
WBC: 7.1 10*3/uL (ref 4.0–10.5)
nRBC: 0 % (ref 0.0–0.2)

## 2023-01-07 LAB — BASIC METABOLIC PANEL
Anion gap: 11 (ref 5–15)
BUN: 17 mg/dL (ref 6–20)
CO2: 25 mmol/L (ref 22–32)
Calcium: 9 mg/dL (ref 8.9–10.3)
Chloride: 100 mmol/L (ref 98–111)
Creatinine, Ser: 0.73 mg/dL (ref 0.44–1.00)
GFR, Estimated: >60 mL/min (ref >60)
Glucose, Bld: 112 mg/dL — ABNORMAL HIGH (ref 70–99)
Potassium: 3.1 mmol/L — ABNORMAL LOW (ref 3.5–5.1)
Sodium: 136 mmol/L (ref 135–145)

## 2023-01-07 LAB — TROPONIN I (HIGH SENSITIVITY): Troponin I (High Sensitivity): 3 ng/L (ref <18)

## 2023-01-07 NOTE — ED Triage Notes (Addendum)
Pt arrived via [facility] from home with c/o substernal chest pain that radiates into upper epigastric area, starting at 2000 tonight. Pt reports hx/o hypertension and palpitations in which she is compliant with RX meds. Pain described as burning that has turned into dull ache.    EMS applied 2inch nitroglycerin paste with pt reports of relief post application.

## 2023-01-07 NOTE — ED Triage Notes (Signed)
EMS brings pt in from home for c/o mid CP

## 2023-01-08 ENCOUNTER — Emergency Department
Admission: EM | Admit: 2023-01-08 | Discharge: 2023-01-08 | Disposition: A | Discharge status: Home / Self Care | Payer: 59 | Attending: Emergency Medicine | Admitting: Emergency Medicine

## 2023-01-08 DIAGNOSIS — E876 Hypokalemia: Secondary | ICD-10-CM

## 2023-01-08 DIAGNOSIS — R0789 Other chest pain: Secondary | ICD-10-CM

## 2023-01-08 LAB — POC URINE PREG, ED: Preg Test, Ur: NEGATIVE

## 2023-01-08 LAB — TROPONIN I (HIGH SENSITIVITY): Troponin I (High Sensitivity): 3 ng/L (ref <18)

## 2023-01-08 MED ORDER — ACETAMINOPHEN 500 MG PO TABS
1000.0000 mg | ORAL_TABLET | Freq: Once | ORAL | Status: DC
  Filled 2023-01-08: qty 2

## 2023-01-08 MED ORDER — BUTALBITAL-APAP-CAFFEINE 50-325-40 MG PO TABS
2.0000 | ORAL_TABLET | Freq: Once | ORAL | Status: AC
  Administered 2023-01-08: 2 via ORAL
  Filled 2023-01-08: qty 2

## 2023-01-08 MED ORDER — POTASSIUM CHLORIDE CRYS ER 20 MEQ PO TBCR
40.0000 meq | EXTENDED_RELEASE_TABLET | Freq: Once | ORAL | Status: AC
  Administered 2023-01-08: 40 meq via ORAL
  Filled 2023-01-08: qty 2

## 2023-01-08 NOTE — ED Provider Notes (Signed)
[facility] Provider Note    Event Date/Time   First MD Initiated Contact with Patient 01/08/23 [phone]     (approximate)   History   Chest Pain   HPI  Kristina Gardner is a 52 y.o. female who presents to the ED for evaluation of Chest Pain   I reviewed PCP visit from 8/2.  Obese patient with history of diabetes, HTN, arthritis  Patient presents for evaluation of "heartburn" to her mid chest and epigastrium.  She reports a long history of heartburn for which she takes omeprazole.  Reports the chest aspect of her pain just started this evening around 8 PM.  Reports that is improved by the time I see her without intervention.  Physical Exam   Triage Vital Signs: ED Triage Vitals  Encounter Vitals Group     BP 01/07/23 2229 (!) 162/113     Systolic BP Percentile --      Diastolic BP Percentile --      Pulse Rate 01/07/23 2229 (!) 107     Resp 01/07/23 2229 19     Temp 01/07/23 2229 98.7 F (37.1 C)     Temp Source 01/07/23 2229 Oral     SpO2 01/07/23 2149 98 %     Weight 01/07/23 2200 198 lb (89.8 kg)     Height 01/07/23 2156 5\' 7"  (1.702 m)     Head Circumference --      Peak Flow --      Pain Score --      Pain Loc --      Pain Education --      Exclude from Growth Chart --     Most recent vital signs: Vitals:   01/08/23 0100 01/08/23 0101  BP: (!) 141/102 (!) 141/102  Pulse: 92 88  Resp: 12 16  Temp:    SpO2: 95% 97%    General: Awake, no distress.  CV:  Good peripheral perfusion.  Resp:  Normal effort.  Abd:  No distention.  MSK:  No deformity noted.  Neuro:  No focal deficits appreciated. Other:     ED Results / Procedures / Treatments   Labs (all labs ordered are listed, but only abnormal results are displayed) Labs Reviewed  BASIC METABOLIC PANEL - Abnormal; Notable for the following components:      Result Value   Potassium 3.1 (*)    Glucose, Bld 112 (*)    All other components within normal limits  CBC -  Abnormal; Notable for the following components:   RBC 5.40 (*)    Hemoglobin 15.1 (*)    HCT 47.6 (*)    All other components within normal limits  POC URINE PREG, ED  TROPONIN I (HIGH SENSITIVITY)  TROPONIN I (HIGH SENSITIVITY)    EKG Sinus rhythm with a rate of 83 bpm.  Normal axis and intervals.  No clear signs of acute ischemia.  RADIOLOGY CXR interpreted by me without evidence of acute cardiopulmonary pathology.  Official radiology report(s): DG Chest 2 View  Result Date: 01/07/2023 CLINICAL DATA:  Chest pain. EXAM: CHEST - 2 VIEW COMPARISON:  05/20/2012 FINDINGS: The cardiomediastinal contours are normal. Subsegmental atelectasis or scarring in the lung bases. Pulmonary vasculature is normal. No consolidation, pleural effusion, or pneumothorax. No acute osseous abnormalities are seen. IMPRESSION: Subsegmental atelectasis or scarring in the lung bases. Electronically Signed   By: Narda Rutherford M.D.   On: 01/07/2023 22:28    PROCEDURES and INTERVENTIONS:  .1-3 Lead  EKG Interpretation  Performed by: Delton Prairie, MD Authorized by: Delton Prairie, MD     Interpretation: normal     ECG rate:  80   ECG rate assessment: normal     Rhythm: sinus rhythm     Ectopy: none     Conduction: normal     Medications  potassium chloride SA (KLOR-CON M) CR tablet 40 mEq (40 mEq Oral Given 01/08/23 0124)  butalbital-acetaminophen-caffeine (FIORICET) 50-325-40 MG per tablet 2 tablet (2 tablets Oral Given 01/08/23 0136)     IMPRESSION / MDM / ASSESSMENT AND PLAN / ED COURSE  I reviewed the triage vital signs and the nursing notes.  Differential diagnosis includes, but is not limited to, ACS, PTX, PNA, muscle strain/spasm, PE, dissection, anxiety, pleural effusion  {Patient presents with symptoms of an acute illness or injury that is potentially life-threatening.  Patient presents with atypical chest discomfort without evidence of acute pathology and suitable for outpatient management.   Look systemically well.  Nonischemic EKG and 2 negative troponins.  Hypokalemia is noted and replaced orally.  Essentially normal CBC.  Clear CXR.  Possibly of gastric etiology and I offered to prescribe her Carafate to supplement her omeprazole but she declines.  While I considered observation admission, after discussing with the patient we decided to discharge with return precautions      FINAL CLINICAL IMPRESSION(S) / ED DIAGNOSES   Final diagnoses:  Other chest pain  Hypokalemia     Rx / DC Orders   ED Discharge Orders     None        Note:  This document was prepared using Dragon voice recognition software and may include unintentional dictation errors.   Delton Prairie, MD 01/08/23 [phone]

## 2023-01-12 ENCOUNTER — Encounter: Payer: Self-pay | Admitting: Internal Medicine

## 2023-01-12 ENCOUNTER — Ambulatory Visit: Payer: 59 | Admitting: Internal Medicine

## 2023-01-12 VITALS — BP 136/88 | HR 92 | Temp 97.7°F | Ht 67.0 in | Wt 201.0 lb

## 2023-01-12 DIAGNOSIS — K2100 Gastro-esophageal reflux disease with esophagitis, without bleeding: Secondary | ICD-10-CM | POA: Insufficient documentation

## 2023-01-12 NOTE — Assessment & Plan Note (Signed)
Had spell that sounds like reflux nitroglycerin helped--could have had spasm Enzymes were negative Still mild symptoms  Asked her to change the omeprazole to bedtime and take a second dose during the day (also fasting) for 1-2 weeks to speed up healing

## 2023-01-12 NOTE — Group Note (Deleted)

## 2023-01-12 NOTE — Progress Notes (Signed)
Subjective:    Patient ID: Kristina Gardner, female    DOB: May 12, 1970, 52 y.o.   MRN: [id number]  HPI Here for ER follow up  Was having some chest pain --then it moved to his belly Happened about 8PM---had eaten lunch ~1PM ([employer]) No other food after this Had stopped at grocery store and dull aching that just worsened Felt like really bad heartburn No SOB No nausea Called rescue---EKG okay but BP high. They put nitropaste on and that might have helped  Pain gradually went away in ER Enzymes and other lab okay--other than mildly low potassium  Still doesn't feel right in chest and stomach  Current Outpatient Medications on File Prior to Visit  Medication Sig Dispense Refill   aspirin EC 81 MG tablet Take 81 mg by mouth daily. Swallow whole.     DILT-XR 180 MG 24 hr capsule TAKE 1 CAPSULE BY MOUTH DAILY 90 capsule 3   estradiol (VIVELLE-DOT) 0.0375 MG/24HR Place 1 patch onto the skin 2 (two) times a week. 24 patch 3   gabapentin (NEURONTIN) 100 MG capsule TAKE 1 TO 3 CAPSULES BY MOUTH AT BEDTIME 90 capsule 3   glucose blood (ONETOUCH VERIO) test strip Use to check blood sugar once a day 100 each 4   levothyroxine (SYNTHROID) 100 MCG tablet TAKE 1 TABLET BY MOUTH DAILY 90 tablet 3   losartan-hydrochlorothiazide (HYZAAR) 100-25 MG tablet TAKE 1 TABLET BY MOUTH EVERY DAY 90 tablet 3   metFORMIN (GLUCOPHAGE-XR) 500 MG 24 hr tablet TAKE 2 TABLETS(1000 MG) BY MOUTH DAILY WITH BREAKFAST 180 tablet 3   omeprazole (PRILOSEC) 20 MG capsule Take 1 capsule (20 mg total) by mouth daily. 180 capsule 2   rosuvastatin (CRESTOR) 5 MG tablet Take 1 tablet (5 mg total) by mouth daily. 90 tablet 3   No current facility-administered medications on file prior to visit.    Allergies  Allergen Reactions   Norvasc [Amlodipine Besylate] Other (See Comments)    dizziness    Past Medical History:  Diagnosis Date   Diabetes mellitus without complication (HCC)    Dysrhythmia    GERD  (gastroesophageal reflux disease)    Hypertension    Leiomyoma of uterus, unspecified    Obesity    Pneumonia    Unspecified hypothyroidism     Past Surgical History:  Procedure Laterality Date   COLONOSCOPY WITH PROPOFOL N/A 08/15/2019   Procedure: COLONOSCOPY WITH PROPOFOL;  Surgeon: Wyline Mood, MD;  Location: [facility] ENDOSCOPY;  Service: Gastroenterology;  Laterality: N/A;   COLONOSCOPY WITH PROPOFOL N/A 10/06/2019   Procedure: COLONOSCOPY WITH PROPOFOL;  Surgeon: Wyline Mood, MD;  Location: [facility] ENDOSCOPY;  Service: Gastroenterology;  Laterality: N/A;   COLONOSCOPY WITH PROPOFOL N/A 10/20/2022   Procedure: COLONOSCOPY WITH PROPOFOL;  Surgeon: Wyline Mood, MD;  Location: [facility] ENDOSCOPY;  Service: Gastroenterology;  Laterality: N/A;   DILATION AND CURETTAGE OF UTERUS  2000    Family History  Problem Relation Age of Onset   Hypertension Mother    Cancer Mother        Bone Marrow    Kidney disease Mother    Arthritis Sister    Cancer Paternal Grandfather        lung cancer   Diabetes Father    Cancer Maternal Aunt        from snuff   Dementia Maternal Aunt    Bladder Cancer Maternal Hollie Salk' disease Other        multiple  Social History   Socioeconomic History   Marital status: Single    Spouse name: Not on file   Number of children: 1   Years of education: Not on file   Highest education level: Some college, no degree  Occupational History   Occupation: [employer], customer service  Tobacco Use   Smoking status: Never    Passive exposure: Never   Smokeless tobacco: Never  Vaping Use   Vaping status: Never Used  Substance and Sexual Activity   Alcohol use: Yes    Comment: occasional,none last 24hrs   Drug use: No   Sexual activity: Yes    Partners: Male    Birth control/protection: Condom, I.U.D.  Other Topics Concern   Not on file  Social History Narrative   1 daughter   Social Determinants of Health   Financial Resource Strain: Low Risk  (01/10/2023)    Overall Financial Resource Strain (CARDIA)    Difficulty of Paying Living Expenses: Not very hard  Food Insecurity: No Food Insecurity (01/10/2023)   Hunger Vital Sign    Worried About Running Out of Food in the Last Year: Never true    Ran Out of Food in the Last Year: Never true  Transportation Needs: No Transportation Needs (01/10/2023)   PRAPARE - Administrator, Civil Service (Medical): No    Lack of Transportation (Non-Medical): No  Physical Activity: Unknown (01/10/2023)   Exercise Vital Sign    Days of Exercise per Week: 0 days    Minutes of Exercise per Session: Not on file  Stress: No Stress Concern Present (01/10/2023)   [employer] of Occupational Health - Occupational Stress Questionnaire    Feeling of Stress : Not at all  Social Connections: Moderately Isolated (01/10/2023)   Social Connection and Isolation Panel [NHANES]    Frequency of Communication with Friends and Family: More than three times a week    Frequency of Social Gatherings with Friends and Family: Once a week    Attends Religious Services: 1 to 4 times per year    Active Member of [employer] or Organizations: No    Attends Engineer, structural: Not on file    Marital Status: Never married  Intimate Partner Violence: Not on file   Review of Systems Does take the omeprazole daily--but along with food    Objective:   Physical Exam Constitutional:      Appearance: Normal appearance.  Cardiovascular:     Rate and Rhythm: Normal rate and regular rhythm.     Heart sounds: Normal heart sounds. No murmur heard.    No gallop.  Pulmonary:     Effort: Pulmonary effort is normal.     Breath sounds: Normal breath sounds. No wheezing or rales.  Abdominal:     Palpations: Abdomen is soft.     Tenderness: There is no abdominal tenderness.  Musculoskeletal:     Cervical back: Neck supple.     Right lower leg: No edema.     Left lower leg: No edema.  Lymphadenopathy:     Cervical: No cervical  adenopathy.  Neurological:     Mental Status: She is alert.            Assessment & Plan:

## 2023-05-20 ENCOUNTER — Ambulatory Visit: Payer: Managed Care, Other (non HMO) | Admitting: Internal Medicine

## 2023-05-20 VITALS — BP 138/86 | HR 79 | Temp 98.7°F | Ht 67.0 in | Wt 202.0 lb

## 2023-05-20 DIAGNOSIS — J22 Unspecified acute lower respiratory infection: Secondary | ICD-10-CM | POA: Insufficient documentation

## 2023-05-20 MED ORDER — BENZONATATE 200 MG PO CAPS: 200.0000 mg | ORAL_CAPSULE | Freq: Three times a day (TID) | ORAL | 0 refills | Status: DC | PRN

## 2023-05-20 MED ORDER — DOXYCYCLINE HYCLATE 100 MG PO TABS: 100.0000 mg | ORAL_TABLET | Freq: Two times a day (BID) | ORAL | 1 refills | Status: DC

## 2023-05-20 NOTE — Progress Notes (Signed)
 Subjective:    Patient ID: Kristina Gardner, female    DOB: July 30, 1970, 53 y.o.   MRN: [id number]  HPI Here due to respiratory infection  Sick for a couple of weeks Started to feel better earlier this week--then worsened again Started with typical head cold---congestion, etc .  Tried aleve D and alka seltzer Then developed chest cough--meds help but only for a while Tried mucinex  as well--and alka seltzer Feels the cough in her chest No fever Can feel her chest in the cold--but no sig SOB Some frontal headache Some sore throat at first--not now No ear pain  Current Outpatient Medications on File Prior to Visit  Medication Sig Dispense Refill   aspirin EC 81 MG tablet Take 81 mg by mouth daily. Swallow whole.     DILT-XR 180 MG 24 hr capsule TAKE 1 CAPSULE BY MOUTH DAILY 90 capsule 3   estradiol  (VIVELLE -DOT) 0.0375 MG/24HR Place 1 patch onto the skin 2 (two) times a week. 24 patch 3   gabapentin  (NEURONTIN ) 100 MG capsule TAKE 1 TO 3 CAPSULES BY MOUTH AT BEDTIME 90 capsule 3   glucose blood (ONETOUCH VERIO) test strip Use to check blood sugar once a day 100 each 4   levothyroxine  (SYNTHROID ) 100 MCG tablet TAKE 1 TABLET BY MOUTH DAILY 90 tablet 3   losartan -hydrochlorothiazide (HYZAAR) 100-25 MG tablet TAKE 1 TABLET BY MOUTH EVERY DAY 90 tablet 3   metFORMIN  (GLUCOPHAGE -XR) 500 MG 24 hr tablet TAKE 2 TABLETS(1000 MG) BY MOUTH DAILY WITH BREAKFAST 180 tablet 3   omeprazole  (PRILOSEC) 20 MG capsule Take 1 capsule (20 mg total) by mouth daily. 180 capsule 2   rosuvastatin  (CRESTOR ) 5 MG tablet Take 1 tablet (5 mg total) by mouth daily. 90 tablet 3   No current facility-administered medications on file prior to visit.    Allergies  Allergen Reactions   Norvasc  [Amlodipine  Besylate] Other (See Comments)    dizziness    Past Medical History:  Diagnosis Date   Diabetes mellitus without complication (HCC)    Dysrhythmia    GERD (gastroesophageal reflux disease)     Hypertension    Leiomyoma of uterus, unspecified    Obesity    Pneumonia    Unspecified hypothyroidism     Past Surgical History:  Procedure Laterality Date   COLONOSCOPY WITH PROPOFOL  N/A 08/15/2019   Procedure: COLONOSCOPY WITH PROPOFOL ;  Surgeon: Luke Salaam, MD;  Location: [facility] ENDOSCOPY;  Service: Gastroenterology;  Laterality: N/A;   COLONOSCOPY WITH PROPOFOL  N/A 10/06/2019   Procedure: COLONOSCOPY WITH PROPOFOL ;  Surgeon: Luke Salaam, MD;  Location: [facility] ENDOSCOPY;  Service: Gastroenterology;  Laterality: N/A;   COLONOSCOPY WITH PROPOFOL  N/A 10/20/2022   Procedure: COLONOSCOPY WITH PROPOFOL ;  Surgeon: Luke Salaam, MD;  Location: [facility] ENDOSCOPY;  Service: Gastroenterology;  Laterality: N/A;   DILATION AND CURETTAGE OF UTERUS  2000    Family History  Problem Relation Age of Onset   Hypertension Mother    Cancer Mother        Bone Marrow    Kidney disease Mother    Arthritis Sister    Cancer Paternal Grandfather        lung cancer   Diabetes Father    Cancer Maternal Aunt        from snuff   Dementia Maternal Aunt    Bladder Cancer Maternal Aunt    Graves' disease Other        multiple    Social History   Socioeconomic History  Marital status: Single    Spouse name: Not on file   Number of children: 1   Years of education: Not on file   Highest education level: Some college, no degree  Occupational History   Occupation: [employer], customer service  Tobacco Use   Smoking status: Never    Passive exposure: Never   Smokeless tobacco: Never  Vaping Use   Vaping status: Never Used  Substance and Sexual Activity   Alcohol use: Yes    Comment: occasional,none last 24hrs   Drug use: No   Sexual activity: Yes    Partners: Male    Birth control/protection: Condom, I.U.D.  Other Topics Concern   Not on file  Social History Narrative   1 daughter   Social Drivers of Health   Financial Resource Strain: Low Risk  (05/20/2023)   Overall Financial Resource Strain  (CARDIA)    Difficulty of Paying Living Expenses: Not hard at all  Food Insecurity: No Food Insecurity (05/20/2023)   Hunger Vital Sign    Worried About Running Out of Food in the Last Year: Never true    Ran Out of Food in the Last Year: Never true  Transportation Needs: No Transportation Needs (05/20/2023)   PRAPARE - Administrator, Civil Service (Medical): No    Lack of Transportation (Non-Medical): No  Physical Activity: Unknown (05/20/2023)   Exercise Vital Sign    Days of Exercise per Week: 0 days    Minutes of Exercise per Session: Not on file  Stress: No Stress Concern Present (05/20/2023)   [employer] of Occupational Health - Occupational Stress Questionnaire    Feeling of Stress : Only a little  Social Connections: Moderately Isolated (05/20/2023)   Social Connection and Isolation Panel [NHANES]    Frequency of Communication with Friends and Family: More than three times a week    Frequency of Social Gatherings with Friends and Family: Once a week    Attends Religious Services: 1 to 4 times per year    Active Member of [employer] or Organizations: No    Attends Engineer, structural: Not on file    Marital Status: Never married  Catering manager Violence: Not on file   Review of Systems Mild nausea--no vomiting Able to eat Did test for COVID--negative     Objective:   Physical Exam Constitutional:      Appearance: Normal appearance.  HENT:     Head:     Comments: No sinus tenderness    Right Ear: Tympanic membrane and ear canal normal.     Left Ear: Tympanic membrane and ear canal normal.     Mouth/Throat:     Pharynx: No oropharyngeal exudate or posterior oropharyngeal erythema.  Pulmonary:     Effort: Pulmonary effort is normal.     Breath sounds: Normal breath sounds. No wheezing or rales.  Musculoskeletal:     Cervical back: Neck supple.  Lymphadenopathy:     Cervical: No cervical adenopathy.  Skin:    Comments: Has slight scaly area  on inside of right pinna--non specific (asked her to try cortaid and go to derm if persists)  Neurological:     Mental Status: She is alert.            Assessment & Plan:

## 2023-05-20 NOTE — Assessment & Plan Note (Signed)
 2 weeks so likely secondary bacterial sinus infection or atypical infection Will treat with doxycycline  100 bid x 7 days (with refill) Analgesics benzonatate 

## 2023-05-26 ENCOUNTER — Other Ambulatory Visit: Payer: Self-pay | Admitting: Internal Medicine

## 2023-06-11 ENCOUNTER — Encounter: Payer: Self-pay | Admitting: Internal Medicine

## 2023-06-11 ENCOUNTER — Telehealth: Payer: Self-pay

## 2023-06-11 ENCOUNTER — Ambulatory Visit (INDEPENDENT_AMBULATORY_CARE_PROVIDER_SITE_OTHER): Payer: Managed Care, Other (non HMO) | Admitting: Internal Medicine

## 2023-06-11 VITALS — BP 124/88 | HR 94 | Temp 98.9°F | Ht 66.5 in | Wt 204.0 lb

## 2023-06-11 DIAGNOSIS — I10 Essential (primary) hypertension: Secondary | ICD-10-CM | POA: Diagnosis not present

## 2023-06-11 DIAGNOSIS — Z0000 Encounter for general adult medical examination without abnormal findings: Secondary | ICD-10-CM

## 2023-06-11 DIAGNOSIS — E1159 Type 2 diabetes mellitus with other circulatory complications: Secondary | ICD-10-CM

## 2023-06-11 LAB — HM DIABETES FOOT EXAM

## 2023-06-11 NOTE — Assessment & Plan Note (Signed)
 Healthy---discussed exercise Prefers no flu or COVID vaccine Due for repeat colonoscopy--contacted Dr Antony Baumgartner Mammogram due in April Recent pap

## 2023-06-11 NOTE — Progress Notes (Signed)
 Subjective:    Patient ID: Kristina Gardner, female    DOB: [birth year], 53 y.o.   MRN: [id number]  HPI Here for physical  Still has some GI issues---may be more from constipation Uses smooth move tea twice a week  Has some back pain---when it is bad, she will take 1000mg  ibuprofen (but not often) Not really doing exercise--knees limit her walking Has gained back 6#  Checks sugars occasionally Generally 130's fasting  Current Outpatient Medications on File Prior to Visit  Medication Sig Dispense Refill   aspirin EC 81 MG tablet Take 81 mg by mouth daily. Swallow whole.     benzonatate  (TESSALON ) 200 MG capsule Take 1 capsule (200 mg total) by mouth 3 (three) times daily as needed for cough. 60 capsule 0   DILT-XR 180 MG 24 hr capsule TAKE 1 CAPSULE BY MOUTH DAILY 90 capsule 3   estradiol  (VIVELLE -DOT) 0.0375 MG/24HR Place 1 patch onto the skin 2 (two) times a week. 24 patch 3   gabapentin  (NEURONTIN ) 100 MG capsule TAKE 1 TO 3 CAPSULES BY MOUTH AT BEDTIME 90 capsule 3   glucose blood (ONETOUCH VERIO) test strip Use to check blood sugar once a day 100 each 4   levothyroxine  (SYNTHROID ) 100 MCG tablet TAKE 1 TABLET BY MOUTH DAILY 90 tablet 3   losartan -hydrochlorothiazide (HYZAAR) 100-25 MG tablet TAKE 1 TABLET BY MOUTH EVERY DAY 90 tablet 3   metFORMIN  (GLUCOPHAGE -XR) 500 MG 24 hr tablet TAKE 2 TABLETS(1000 MG) BY MOUTH DAILY WITH BREAKFAST 180 tablet 3   omeprazole  (PRILOSEC) 20 MG capsule Take 1 capsule (20 mg total) by mouth daily. 180 capsule 2   rosuvastatin  (CRESTOR ) 5 MG tablet TAKE 1 TABLET(5 MG) BY MOUTH DAILY 90 tablet 3   No current facility-administered medications on file prior to visit.    Allergies  Allergen Reactions   Norvasc  [Amlodipine  Besylate] Other (See Comments)    dizziness    Past Medical History:  Diagnosis Date   Diabetes mellitus without complication (HCC)    Dysrhythmia    GERD (gastroesophageal reflux disease)    Hypertension    Leiomyoma  of uterus, unspecified    Obesity    Pneumonia    Unspecified hypothyroidism     Past Surgical History:  Procedure Laterality Date   COLONOSCOPY WITH PROPOFOL  N/A 08/15/2019   Procedure: COLONOSCOPY WITH PROPOFOL ;  Surgeon: Therisa Bi, MD;  Location: [facility] ENDOSCOPY;  Service: Gastroenterology;  Laterality: N/A;   COLONOSCOPY WITH PROPOFOL  N/A 10/06/2019   Procedure: COLONOSCOPY WITH PROPOFOL ;  Surgeon: Therisa Bi, MD;  Location: [facility] ENDOSCOPY;  Service: Gastroenterology;  Laterality: N/A;   COLONOSCOPY WITH PROPOFOL  N/A 10/20/2022   Procedure: COLONOSCOPY WITH PROPOFOL ;  Surgeon: Therisa Bi, MD;  Location: [facility] ENDOSCOPY;  Service: Gastroenterology;  Laterality: N/A;   DILATION AND CURETTAGE OF UTERUS  2000    Family History  Problem Relation Age of Onset   Hypertension Mother    Cancer Mother        Bone Marrow    Kidney disease Mother    Arthritis Sister    Cancer Paternal Grandfather        lung cancer   Diabetes Father    Cancer Maternal Aunt        from snuff   Dementia Maternal Aunt    Bladder Cancer Maternal Aunt    Graves' disease Other        multiple    Social History   Socioeconomic History   Marital status: Single  Spouse name: Not on file   Number of children: 1   Years of education: Not on file   Highest education level: Some college, no degree  Occupational History   Occupation: [employer], customer service  Tobacco Use   Smoking status: Never    Passive exposure: Never   Smokeless tobacco: Never  Vaping Use   Vaping status: Never Used  Substance and Sexual Activity   Alcohol use: Yes    Comment: occasional,none last 24hrs   Drug use: No   Sexual activity: Yes    Partners: Male    Birth control/protection: Condom, I.U.D.  Other Topics Concern   Not on file  Social History Narrative   1 daughter   Social Drivers of Health   Financial Resource Strain: Low Risk  (05/20/2023)   Overall Financial Resource Strain (CARDIA)    Difficulty of  Paying Living Expenses: Not hard at all  Food Insecurity: No Food Insecurity (05/20/2023)   Hunger Vital Sign    Worried About Running Out of Food in the Last Year: Never true    Ran Out of Food in the Last Year: Never true  Transportation Needs: No Transportation Needs (05/20/2023)   PRAPARE - Administrator, Civil Service (Medical): No    Lack of Transportation (Non-Medical): No  Physical Activity: Unknown (05/20/2023)   Exercise Vital Sign    Days of Exercise per Week: 0 days    Minutes of Exercise per Session: Not on file  Stress: No Stress Concern Present (05/20/2023)   [employer] of Occupational Health - Occupational Stress Questionnaire    Feeling of Stress : Only a little  Social Connections: Moderately Isolated (05/20/2023)   Social Connection and Isolation Panel [NHANES]    Frequency of Communication with Friends and Family: More than three times a week    Frequency of Social Gatherings with Friends and Family: Once a week    Attends Religious Services: 1 to 4 times per year    Active Member of [employer] or Organizations: No    Attends Engineer, Structural: Not on file    Marital Status: Never married  Intimate Partner Violence: Not on file   Review of Systems  Constitutional:  Negative for fatigue.  HENT:  Positive for tinnitus. Negative for dental problem and hearing loss.   Eyes:  Negative for visual disturbance.       No diplopia or unilateral vision loss  Respiratory:  Negative for cough, chest tightness and shortness of breath.   Cardiovascular:  Negative for palpitations and leg swelling.       Gets chest pain in the cold and at work  Gastrointestinal:  Positive for constipation. Negative for blood in stool.       Occ heartburn  Endocrine: Negative for polydipsia and polyuria.  Genitourinary:  Negative for dyspareunia, dysuria and hematuria.  Musculoskeletal:  Positive for arthralgias and back pain. Negative for joint swelling.  Skin:   Negative for rash.  Allergic/Immunologic: Negative for environmental allergies and immunocompromised state.  Neurological:  Negative for dizziness, syncope, light-headedness and headaches.  Hematological:  Negative for adenopathy. Does not bruise/bleed easily.  Psychiatric/Behavioral:  Negative for dysphoric mood. The patient is not nervous/anxious.        Uses gabapentin  occasionally to help sleep       Objective:   Physical Exam Constitutional:      Appearance: Normal appearance.  HENT:     Mouth/Throat:     Pharynx: No oropharyngeal exudate or  posterior oropharyngeal erythema.  Eyes:     Conjunctiva/sclera: Conjunctivae normal.     Pupils: Pupils are equal, round, and reactive to light.  Cardiovascular:     Rate and Rhythm: Normal rate and regular rhythm.     Pulses: Normal pulses.     Heart sounds: No murmur heard.    No gallop.  Pulmonary:     Effort: Pulmonary effort is normal.     Breath sounds: Normal breath sounds. No wheezing or rales.  Abdominal:     Palpations: Abdomen is soft.     Tenderness: There is no abdominal tenderness.  Musculoskeletal:     Cervical back: Neck supple.     Right lower leg: No edema.     Left lower leg: No edema.  Lymphadenopathy:     Cervical: No cervical adenopathy.  Skin:    Findings: No rash.     Comments: No foot lesions  Neurological:     General: No focal deficit present.     Mental Status: She is alert and oriented to person, place, and time.     Comments: Normal sensation in feet  Psychiatric:        Mood and Affect: Mood normal.        Behavior: Behavior normal.            Assessment & Plan:

## 2023-06-11 NOTE — Assessment & Plan Note (Signed)
 Hopefully still good control on metformin  1000 daily

## 2023-06-11 NOTE — Telephone Encounter (Signed)
-----   Message from Wyline Mood sent at 06/11/2023  8:59 AM EST ----- Good morning Shawna Orleans   Can you look into this and have this patient set up for a colonoscopy with 2-3 day prep ideally on a monday so that if dirty we can bring her back again on Tuesday   Thank you Dr Alphonsus Sias for the catch  Regards Kiran ----- Message ----- From: Karie Schwalbe, MD Sent: 06/11/2023   8:36 AM EST To: Wyline Mood, MD  Kiran, She needs her repeat colonoscopy with more aggressive prep. She hasn't heard back from your office  RIch

## 2023-06-11 NOTE — Assessment & Plan Note (Signed)
 BP Readings from Last 3 Encounters:  06/11/23 124/88  05/20/23 138/86  01/12/23 136/88   Controlled on the losartan /hydrochlorothiazide  And diltiazem 

## 2023-06-12 ENCOUNTER — Encounter: Payer: Self-pay | Admitting: Internal Medicine

## 2023-06-12 LAB — COMPREHENSIVE METABOLIC PANEL
ALT: 11 [IU]/L (ref 0–32)
AST: 13 [IU]/L (ref 0–40)
Albumin: 4.4 g/dL (ref 3.8–4.9)
Alkaline Phosphatase: 118 [IU]/L (ref 44–121)
BUN/Creatinine Ratio: 17 (ref 9–23)
BUN: 14 mg/dL (ref 6–24)
Bilirubin Total: 0.4 mg/dL (ref 0.0–1.2)
CO2: 25 mmol/L (ref 20–29)
Calcium: 9.7 mg/dL (ref 8.7–10.2)
Chloride: 100 mmol/L (ref 96–106)
Creatinine, Ser: 0.82 mg/dL (ref 0.57–1.00)
Globulin, Total: 2.9 g/dL (ref 1.5–4.5)
Glucose: 95 mg/dL (ref 70–99)
Potassium: 3.9 mmol/L (ref 3.5–5.2)
Sodium: 141 mmol/L (ref 134–144)
Total Protein: 7.3 g/dL (ref 6.0–8.5)
eGFR: 84 mL/min/{1.73_m2} (ref >59)

## 2023-06-12 LAB — CBC
Hematocrit: 49.5 % — ABNORMAL HIGH (ref 34.0–46.6)
Hemoglobin: 15.8 g/dL (ref 11.1–15.9)
MCH: 28.4 pg (ref 26.6–33.0)
MCHC: 31.9 g/dL (ref 31.5–35.7)
MCV: 89 fL (ref 79–97)
Platelets: 318 10*3/uL (ref 150–450)
RBC: 5.57 x10E6/uL — ABNORMAL HIGH (ref 3.77–5.28)
RDW: 13.1 % (ref 11.7–15.4)
WBC: 4 10*3/uL (ref 3.4–10.8)

## 2023-06-12 LAB — LIPID PANEL
Chol/HDL Ratio: 2.6 {ratio} (ref 0.0–4.4)
Cholesterol, Total: 179 mg/dL (ref 100–199)
HDL: 69 mg/dL (ref >39)
LDL Chol Calc (NIH): 96 mg/dL (ref 0–99)
Triglycerides: 75 mg/dL (ref 0–149)
VLDL Cholesterol Cal: 14 mg/dL (ref 5–40)

## 2023-06-12 LAB — MICROALBUMIN / CREATININE URINE RATIO
Creatinine, Urine: 30.5 mg/dL
Microalb/Creat Ratio: 13 mg/g{creat} (ref 0–29)
Microalbumin, Urine: 3.9 ug/mL

## 2023-06-12 LAB — HEMOGLOBIN A1C
Est. average glucose Bld gHb Est-mCnc: 134 mg/dL
Hgb A1c MFr Bld: 6.3 % — ABNORMAL HIGH (ref 4.8–5.6)

## 2023-06-24 ENCOUNTER — Telehealth: Payer: Self-pay

## 2023-06-24 NOTE — Telephone Encounter (Signed)
-----   Message from Wyline Mood sent at 06/11/2023  8:59 AM EST ----- Good morning Shawna Orleans   Can you look into this and have this patient set up for a colonoscopy with 2-3 day prep ideally on a monday so that if dirty we can bring her back again on Tuesday   Thank you Dr Alphonsus Sias for the catch  Regards Kiran ----- Message ----- From: Karie Schwalbe, MD Sent: 06/11/2023   8:36 AM EST To: Wyline Mood, MD  Kiran, She needs her repeat colonoscopy with more aggressive prep. She hasn't heard back from your office  RIch

## 2023-06-24 NOTE — Telephone Encounter (Signed)
 Called patient and had to leave her a detailed message letting her know that we were trying to reach out to her to schedule her a repeat colonoscopy so to please call us back or message [place] via MyChart. I will also send her a message her via MyChart asking her if she is wanting to schedule.

## 2023-06-26 ENCOUNTER — Encounter: Payer: Self-pay | Admitting: Internal Medicine

## 2023-06-26 ENCOUNTER — Ambulatory Visit
Admission: RE | Admit: 2023-06-26 | Discharge: 2023-06-26 | Disposition: A | Discharge status: Home / Self Care | Payer: Managed Care, Other (non HMO) | Source: Ambulatory Visit | Attending: Internal Medicine | Admitting: Internal Medicine

## 2023-06-26 ENCOUNTER — Ambulatory Visit: Payer: Managed Care, Other (non HMO) | Admitting: Internal Medicine

## 2023-06-26 VITALS — BP 124/80 | HR 80 | Temp 98.3°F | Ht 66.5 in | Wt 207.0 lb

## 2023-06-26 DIAGNOSIS — R0989 Other specified symptoms and signs involving the circulatory and respiratory systems: Secondary | ICD-10-CM

## 2023-06-26 NOTE — Progress Notes (Signed)
 Subjective:    Patient ID: Kristina Gardner, female    DOB: Jun 07, 1970, 53 y.o.   MRN: [id number]  HPI Here due to chest pain/congesion  Feels chest congestion and has pain in chest and back Doesn't feel sick No fever, cough, chills No SOB  Did have ill feeling 4 days ago Like ?fever--had to lie down  No heartburn--takes the omeprazole daily No dysphagia  Took advil this morning Also has tried cold meds--due to the congestion feeling May have helped some--advil better on the back  No new tasks, lifting or working over her head  Current Outpatient Medications on File Prior to Visit  Medication Sig Dispense Refill   aspirin EC 81 MG tablet Take 81 mg by mouth daily. Swallow whole.     DILT-XR 180 MG 24 hr capsule TAKE 1 CAPSULE BY MOUTH DAILY 90 capsule 3   estradiol (VIVELLE-DOT) 0.0375 MG/24HR Place 1 patch onto the skin 2 (two) times a week. 24 patch 3   gabapentin (NEURONTIN) 100 MG capsule TAKE 1 TO 3 CAPSULES BY MOUTH AT BEDTIME 90 capsule 3   glucose blood (ONETOUCH VERIO) test strip Use to check blood sugar once a day 100 each 4   levothyroxine (SYNTHROID) 100 MCG tablet TAKE 1 TABLET BY MOUTH DAILY 90 tablet 3   losartan-hydrochlorothiazide (HYZAAR) 100-25 MG tablet TAKE 1 TABLET BY MOUTH EVERY DAY 90 tablet 3   metFORMIN (GLUCOPHAGE-XR) 500 MG 24 hr tablet TAKE 2 TABLETS(1000 MG) BY MOUTH DAILY WITH BREAKFAST 180 tablet 3   omeprazole (PRILOSEC) 20 MG capsule Take 1 capsule (20 mg total) by mouth daily. 180 capsule 2   rosuvastatin (CRESTOR) 5 MG tablet TAKE 1 TABLET(5 MG) BY MOUTH DAILY 90 tablet 3   No current facility-administered medications on file prior to visit.    Allergies  Allergen Reactions   Norvasc [Amlodipine Besylate] Other (See Comments)    dizziness    Past Medical History:  Diagnosis Date   Diabetes mellitus without complication (HCC)    Dysrhythmia    GERD (gastroesophageal reflux disease)    Hypertension    Leiomyoma of uterus,  unspecified    Obesity    Pneumonia    Unspecified hypothyroidism     Past Surgical History:  Procedure Laterality Date   COLONOSCOPY WITH PROPOFOL N/A 08/15/2019   Procedure: COLONOSCOPY WITH PROPOFOL;  Surgeon: Wyline Mood, MD;  Location: [facility] ENDOSCOPY;  Service: Gastroenterology;  Laterality: N/A;   COLONOSCOPY WITH PROPOFOL N/A 10/06/2019   Procedure: COLONOSCOPY WITH PROPOFOL;  Surgeon: Wyline Mood, MD;  Location: [facility] ENDOSCOPY;  Service: Gastroenterology;  Laterality: N/A;   COLONOSCOPY WITH PROPOFOL N/A 10/20/2022   Procedure: COLONOSCOPY WITH PROPOFOL;  Surgeon: Wyline Mood, MD;  Location: [facility] ENDOSCOPY;  Service: Gastroenterology;  Laterality: N/A;   DILATION AND CURETTAGE OF UTERUS  2000    Family History  Problem Relation Age of Onset   Hypertension Mother    Cancer Mother        Bone Marrow    Kidney disease Mother    Arthritis Sister    Cancer Paternal Grandfather        lung cancer   Diabetes Father    Cancer Maternal Aunt        from snuff   Dementia Maternal Aunt    Bladder Cancer Maternal Aunt    Graves' disease Other        multiple    Social History   Socioeconomic History   Marital status: Single  Spouse name: Not on file   Number of children: 1   Years of education: Not on file   Highest education level: Some college, no degree  Occupational History   Occupation: [employer], customer service  Tobacco Use   Smoking status: Never    Passive exposure: Never   Smokeless tobacco: Never  Vaping Use   Vaping status: Never Used  Substance and Sexual Activity   Alcohol use: Yes    Comment: occasional,none last 24hrs   Drug use: No   Sexual activity: Yes    Partners: Male    Birth control/protection: Condom, I.U.D.  Other Topics Concern   Not on file  Social History Narrative   1 daughter   Social Drivers of Health   Financial Resource Strain: Low Risk  (05/20/2023)   Overall Financial Resource Strain (CARDIA)    Difficulty of Paying Living  Expenses: Not hard at all  Food Insecurity: No Food Insecurity (05/20/2023)   Hunger Vital Sign    Worried About Running Out of Food in the Last Year: Never true    Ran Out of Food in the Last Year: Never true  Transportation Needs: No Transportation Needs (05/20/2023)   PRAPARE - Administrator, Civil Service (Medical): No    Lack of Transportation (Non-Medical): No  Physical Activity: Unknown (05/20/2023)   Exercise Vital Sign    Days of Exercise per Week: 0 days    Minutes of Exercise per Session: Not on file  Stress: No Stress Concern Present (05/20/2023)   [employer] of Occupational Health - Occupational Stress Questionnaire    Feeling of Stress : Only a little  Social Connections: Moderately Isolated (05/20/2023)   Social Connection and Isolation Panel [NHANES]    Frequency of Communication with Friends and Family: More than three times a week    Frequency of Social Gatherings with Friends and Family: Once a week    Attends Religious Services: 1 to 4 times per year    Active Member of [employer] or Organizations: No    Attends Engineer, structural: Not on file    Marital Status: Never married  Catering manager Violence: Not on file   Review of Systems No N/V Eating okay    Objective:   Physical Exam HENT:     Right Ear: Tympanic membrane and ear canal normal.     Left Ear: Tympanic membrane and ear canal normal.     Mouth/Throat:     Pharynx: No oropharyngeal exudate or posterior oropharyngeal erythema.  Cardiovascular:     Rate and Rhythm: Normal rate and regular rhythm.     Heart sounds: No murmur heard.    No gallop.  Pulmonary:     Effort: Pulmonary effort is normal.     Breath sounds: Normal breath sounds. No wheezing or rales.  Musculoskeletal:     Cervical back: Neck supple.  Lymphadenopathy:     Cervical: No cervical adenopathy.            Assessment & Plan:

## 2023-06-26 NOTE — Telephone Encounter (Signed)
 Spoke to pt. Made appt today at 1215.

## 2023-06-26 NOTE — Assessment & Plan Note (Addendum)
 Somewhat vague but some illness type symptoms (as opposed to other kind of chest pain) Will check chest x-ray--this looks okay (less lower lobe atelectasis or scarring) Discussed advil and heat Probably stop the cold meds If worsens next week--would try empiric antibiotic

## 2023-06-30 MED ORDER — DOXYCYCLINE HYCLATE 100 MG PO TABS: 100.0000 mg | ORAL_TABLET | Freq: Two times a day (BID) | ORAL | 0 refills | Status: DC

## 2023-06-30 NOTE — Addendum Note (Signed)
 Addended by: Tillman Abide I on: 06/30/2023 01:11 PM   Modules accepted: Orders

## 2023-07-01 ENCOUNTER — Other Ambulatory Visit: Payer: Self-pay

## 2023-07-01 DIAGNOSIS — Z860100 Personal history of colon polyps, unspecified: Secondary | ICD-10-CM

## 2023-07-01 MED ORDER — NA SULFATE-K SULFATE-MG SULF 17.5-3.13-1.6 GM/177ML PO SOLN: 708.0000 mL | Freq: Once | ORAL | 0 refills | Status: AC

## 2023-07-01 NOTE — Addendum Note (Signed)
 Addended by: Adela Ports on: 07/01/2023 04:19 PM   Modules accepted: Orders

## 2023-07-06 ENCOUNTER — Encounter: Payer: Self-pay | Admitting: Internal Medicine

## 2023-07-08 ENCOUNTER — Other Ambulatory Visit: Payer: Self-pay | Admitting: Internal Medicine

## 2023-08-10 ENCOUNTER — Ambulatory Visit
Admission: RE | Admit: 2023-08-10 | Discharge: 2023-08-10 | Disposition: A | Discharge status: Home / Self Care | Payer: Managed Care, Other (non HMO) | Source: Ambulatory Visit | Attending: Gastroenterology | Admitting: Gastroenterology

## 2023-08-10 ENCOUNTER — Encounter
Admission: RE | Disposition: A | Discharge status: Home / Self Care | Payer: Self-pay | Source: Ambulatory Visit | Attending: Gastroenterology

## 2023-08-10 ENCOUNTER — Ambulatory Visit: Admitting: General Practice

## 2023-08-10 ENCOUNTER — Other Ambulatory Visit: Payer: Self-pay

## 2023-08-10 ENCOUNTER — Encounter: Payer: Self-pay | Admitting: Gastroenterology

## 2023-08-10 DIAGNOSIS — Z1211 Encounter for screening for malignant neoplasm of colon: Secondary | ICD-10-CM

## 2023-08-10 DIAGNOSIS — Z7984 Long term (current) use of oral hypoglycemic drugs: Secondary | ICD-10-CM | POA: Diagnosis not present

## 2023-08-10 DIAGNOSIS — K219 Gastro-esophageal reflux disease without esophagitis: Secondary | ICD-10-CM | POA: Diagnosis not present

## 2023-08-10 DIAGNOSIS — Z860101 Personal history of adenomatous and serrated colon polyps: Secondary | ICD-10-CM | POA: Insufficient documentation

## 2023-08-10 DIAGNOSIS — I10 Essential (primary) hypertension: Secondary | ICD-10-CM | POA: Insufficient documentation

## 2023-08-10 DIAGNOSIS — E119 Type 2 diabetes mellitus without complications: Secondary | ICD-10-CM | POA: Insufficient documentation

## 2023-08-10 DIAGNOSIS — Z09 Encounter for follow-up examination after completed treatment for conditions other than malignant neoplasm: Secondary | ICD-10-CM | POA: Insufficient documentation

## 2023-08-10 DIAGNOSIS — Z860100 Personal history of colon polyps, unspecified: Secondary | ICD-10-CM

## 2023-08-10 HISTORY — PX: COLONOSCOPY WITH PROPOFOL: SHX5780

## 2023-08-10 LAB — GLUCOSE, CAPILLARY: Glucose-Capillary: 99 mg/dL (ref 70–99)

## 2023-08-10 SURGERY — COLONOSCOPY WITH PROPOFOL
Anesthesia: General

## 2023-08-10 MED ORDER — PROPOFOL 10 MG/ML IV BOLUS
INTRAVENOUS | Status: DC | PRN
  Administered 2023-08-10: 50 mg via INTRAVENOUS

## 2023-08-10 MED ORDER — PROPOFOL 500 MG/50ML IV EMUL
INTRAVENOUS | Status: DC | PRN
  Administered 2023-08-10: 75 ug/kg/min via INTRAVENOUS

## 2023-08-10 MED ORDER — DEXMEDETOMIDINE HCL IN NACL 80 MCG/20ML IV SOLN
INTRAVENOUS | Status: DC | PRN
  Administered 2023-08-10: 20 ug via INTRAVENOUS

## 2023-08-10 MED ORDER — LIDOCAINE HCL (CARDIAC) PF 100 MG/5ML IV SOSY
PREFILLED_SYRINGE | INTRAVENOUS | Status: DC | PRN
  Administered 2023-08-10: 60 mg via INTRAVENOUS

## 2023-08-10 MED ORDER — PHENYLEPHRINE 80 MCG/ML (10ML) SYRINGE FOR IV PUSH (FOR BLOOD PRESSURE SUPPORT)
PREFILLED_SYRINGE | INTRAVENOUS | Status: DC | PRN
  Administered 2023-08-10: 240 ug via INTRAVENOUS
  Administered 2023-08-10: 160 ug via INTRAVENOUS

## 2023-08-10 MED ORDER — SODIUM CHLORIDE 0.9 % IV SOLN: INTRAVENOUS | Status: DC

## 2023-08-10 NOTE — Transfer of Care (Signed)
 Immediate Anesthesia Transfer of Care Note  Patient: Kristina Gardner  Procedure(s) Performed: COLONOSCOPY WITH PROPOFOL  Patient Location: PACU  Anesthesia Type:General  Level of Consciousness: sedated  Airway & Oxygen Therapy: Patient Spontanous Breathing  Post-op Assessment: Report given to RN and Post -op Vital signs reviewed and stable  Post vital signs: Reviewed and stable  Last Vitals:  Vitals Value Taken Time  BP    Temp    Pulse 84 08/10/23 1032  Resp 16 08/10/23 1032  SpO2 96 % 08/10/23 1032  Vitals shown include unfiled device data.  Last Pain:  Vitals:   08/10/23 0933  TempSrc: Tympanic         Complications: No notable events documented.

## 2023-08-10 NOTE — Anesthesia Postprocedure Evaluation (Signed)
 Anesthesia Post Note  Patient: Kristina Gardner  Procedure(s) Performed: COLONOSCOPY WITH PROPOFOL  Patient location during evaluation: Endoscopy Anesthesia Type: General Level of consciousness: awake and alert Pain management: pain level controlled Vital Signs Assessment: post-procedure vital signs reviewed and stable Respiratory status: spontaneous breathing, nonlabored ventilation, respiratory function stable and patient connected to nasal cannula oxygen Cardiovascular status: blood pressure returned to baseline and stable Postop Assessment: no apparent nausea or vomiting Anesthetic complications: no  No notable events documented.   Last Vitals:  Vitals:   08/10/23 0933 08/10/23 1032  BP: (!) 125/103 (!) 89/60  Pulse: 95 93  Resp: 20 16  Temp: (!) 36.1 C 36.7 C  SpO2: 96% 96%    Last Pain:  Vitals:   08/10/23 1032  TempSrc: Temporal                 Stephanie Coup

## 2023-08-10 NOTE — H&P (Signed)
 Wyline Mood, MD [address], [address], [place], [place], [address] [address], [place], [place], [address] Phone: [phone]  Fax: [phone]  Primary Care Physician:  Karie Schwalbe, MD   Pre-Procedure History & Physical: HPI:  Kristina Gardner is a 53 y.o. female is here for an colonoscopy.   Past Medical History:  Diagnosis Date   Diabetes mellitus without complication (HCC)    Dysrhythmia    GERD (gastroesophageal reflux disease)    Hypertension    Leiomyoma of uterus, unspecified    Obesity    Pneumonia    Unspecified hypothyroidism     Past Surgical History:  Procedure Laterality Date   COLONOSCOPY WITH PROPOFOL N/A 08/15/2019   Procedure: COLONOSCOPY WITH PROPOFOL;  Surgeon: Wyline Mood, MD;  Location: [facility] ENDOSCOPY;  Service: Gastroenterology;  Laterality: N/A;   COLONOSCOPY WITH PROPOFOL N/A 10/06/2019   Procedure: COLONOSCOPY WITH PROPOFOL;  Surgeon: Wyline Mood, MD;  Location: [facility] ENDOSCOPY;  Service: Gastroenterology;  Laterality: N/A;   COLONOSCOPY WITH PROPOFOL N/A 10/20/2022   Procedure: COLONOSCOPY WITH PROPOFOL;  Surgeon: Wyline Mood, MD;  Location: [facility] ENDOSCOPY;  Service: Gastroenterology;  Laterality: N/A;   DILATION AND CURETTAGE OF UTERUS  2000    Prior to Admission medications   Medication Sig Start Date End Date Taking? Authorizing Provider  aspirin EC 81 MG tablet Take 81 mg by mouth daily. Swallow whole.    [provider]  DILT-XR 180 MG 24 hr capsule TAKE 1 CAPSULE BY MOUTH DAILY 12/09/22   Karie Schwalbe, MD  doxycycline (VIBRA-TABS) 100 MG tablet Take 1 tablet (100 mg total) by mouth 2 (two) times daily. 06/30/23   Karie Schwalbe, MD  estradiol (VIVELLE-DOT) 0.0375 MG/24HR Place 1 patch onto the skin 2 (two) times a week. 07/17/22   Reva Bores, MD  gabapentin (NEURONTIN) 100 MG capsule TAKE 1 TO 3 CAPSULES BY MOUTH AT BEDTIME 07/16/22   Tillman Abide I, MD  glucose blood (ONETOUCH VERIO) test strip Use to  check blood sugar once a day 07/17/20   Karie Schwalbe, MD  levothyroxine (SYNTHROID) 100 MCG tablet TAKE 1 TABLET BY MOUTH DAILY 05/21/22   Karie Schwalbe, MD  losartan-hydrochlorothiazide (HYZAAR) 100-25 MG tablet TAKE 1 TABLET BY MOUTH EVERY DAY 09/24/22   Tillman Abide I, MD  metFORMIN (GLUCOPHAGE-XR) 500 MG 24 hr tablet TAKE 2 TABLETS(1000 MG) BY MOUTH DAILY WITH BREAKFAST 07/08/23   Karie Schwalbe, MD  omeprazole (PRILOSEC) 20 MG capsule Take 1 capsule (20 mg total) by mouth daily. 09/16/22   Karie Schwalbe, MD  rosuvastatin (CRESTOR) 5 MG tablet TAKE 1 TABLET(5 MG) BY MOUTH DAILY 05/26/23   Karie Schwalbe, MD    Allergies as of 07/02/2023 - Review Complete 06/26/2023  Allergen Reaction Noted   Norvasc [amlodipine besylate] Other (See Comments) 12/11/2014    Family History  Problem Relation Age of Onset   Hypertension Mother    Cancer Mother        Bone Marrow    Kidney disease Mother    Arthritis Sister    Cancer Paternal Grandfather        lung cancer   Diabetes Father    Cancer Maternal Aunt        from snuff   Dementia Maternal Aunt    Bladder Cancer Maternal Aunt    Graves' disease Other        multiple    Social History   Socioeconomic History  Marital status: Single    Spouse name: Not on file   Number of children: 1   Years of education: Not on file   Highest education level: Some college, no degree  Occupational History   Occupation: [employer], customer service  Tobacco Use   Smoking status: Never    Passive exposure: Never   Smokeless tobacco: Never  Vaping Use   Vaping status: Never Used  Substance and Sexual Activity   Alcohol use: Yes    Comment: occasional,none last 24hrs   Drug use: No   Sexual activity: Yes    Partners: Male    Birth control/protection: Condom, I.U.D.  Other Topics Concern   Not on file  Social History Narrative   1 daughter   Social Drivers of Health   Financial Resource Strain: Low Risk  (05/20/2023)    Overall Financial Resource Strain (CARDIA)    Difficulty of Paying Living Expenses: Not hard at all  Food Insecurity: No Food Insecurity (05/20/2023)   Hunger Vital Sign    Worried About Running Out of Food in the Last Year: Never true    Ran Out of Food in the Last Year: Never true  Transportation Needs: No Transportation Needs (05/20/2023)   PRAPARE - Administrator, Civil Service (Medical): No    Lack of Transportation (Non-Medical): No  Physical Activity: Unknown (05/20/2023)   Exercise Vital Sign    Days of Exercise per Week: 0 days    Minutes of Exercise per Session: Not on file  Stress: No Stress Concern Present (05/20/2023)   [employer] of Occupational Health - Occupational Stress Questionnaire    Feeling of Stress : Only a little  Social Connections: Moderately Isolated (05/20/2023)   Social Connection and Isolation Panel [NHANES]    Frequency of Communication with Friends and Family: More than three times a week    Frequency of Social Gatherings with Friends and Family: Once a week    Attends Religious Services: 1 to 4 times per year    Active Member of [employer] or Organizations: No    Attends Engineer, structural: Not on file    Marital Status: Never married  Catering manager Violence: Not on file    Review of Systems: See HPI, otherwise negative ROS  Physical Exam: LMP  (LMP Unknown) Comment: pt states that is has been more than 5 years since last menstrual period General:   Alert,  pleasant and cooperative in NAD Head:  Normocephalic and atraumatic. Neck:  Supple; no masses or thyromegaly. Lungs:  Clear throughout to auscultation, normal respiratory effort.    Heart:  +S1, +S2, Regular rate and rhythm, No edema. Abdomen:  Soft, nontender and nondistended. Normal bowel sounds, without guarding, and without rebound.   Neurologic:  Alert and  oriented x4;  grossly normal neurologically.  Impression/Plan: Kristina Gardner is here for an  colonoscopy to be performed for surveillance due to prior history of colon polyps   Risks, benefits, limitations, and alternatives regarding  colonoscopy have been reviewed with the patient.  Questions have been answered.  All parties agreeable.   Wyline Mood, MD  08/10/2023, 9:27 AM

## 2023-08-10 NOTE — Op Note (Signed)
 [facility] Gastroenterology Patient Name: Kristina Gardner Procedure Date: 08/10/2023 10:08 AM MRN: [id number] Account #: [id number] Date of Birth: 1970-08-12 Admit Type: Outpatient Age: 53 Room: [facility] ENDO ROOM 1 Gender: Female Note Status: Finalized Instrument Name: Prentice Docker [id number] Procedure:             Colonoscopy Indications:           Surveillance: Personal history of adenomatous polyps                         on last colonoscopy > 3 years ago, Last colonoscopy:                         June 2024 Providers:             Wyline Mood MD, MD Referring MD:          Karie Schwalbe (Referring MD) Medicines:             Monitored Anesthesia Care Complications:         No immediate complications. Procedure:             Pre-Anesthesia Assessment:                        - Prior to the procedure, a History and Physical was                         performed, and patient medications, allergies and                         sensitivities were reviewed. The patient's tolerance                         of previous anesthesia was reviewed.                        - The risks and benefits of the procedure and the                         sedation options and risks were discussed with the                         patient. All questions were answered and informed                         consent was obtained.                        - ASA Grade Assessment: II - A patient with mild                         systemic disease.                        After obtaining informed consent, the colonoscope was                         passed under direct vision. Throughout the procedure,                         the patient's blood  pressure, pulse, and oxygen                         saturations were monitored continuously. The                         Colonoscope was introduced through the anus and                         advanced to the the cecum, identified by the                          appendiceal orifice. The colonoscopy was performed                         with ease. The patient tolerated the procedure well.                         The quality of the bowel preparation was excellent.                         The ileocecal valve, appendiceal orifice, and rectum                         were photographed. Findings:      The perianal and digital rectal examinations were normal.      The entire examined colon appeared normal on direct and retroflexion       views. Impression:            - The entire examined colon is normal on direct and                         retroflexion views.                        - No specimens collected. Recommendation:        - Discharge patient to home (with escort).                        - Resume previous diet.                        - Continue present medications.                        - Repeat colonoscopy in 5 years for surveillance. Procedure Code(s):     --- Professional ---                        [id number], Colonoscopy, flexible; diagnostic, including                         collection of specimen(s) by brushing or washing, when                         performed (separate procedure) Diagnosis Code(s):     --- Professional ---                        Z86.010, Personal history of colonic polyps CPT copyright 2022 American Medical Association. All  rights reserved. The codes documented in this report are preliminary and upon coder review may  be revised to meet current compliance requirements. Wyline Mood, MD Wyline Mood MD, MD 08/10/2023 10:29:53 AM This report has been signed electronically. Number of Addenda: 0 Note Initiated On: 08/10/2023 10:08 AM Scope Withdrawal Time: 0 hours 9 minutes 49 seconds  Total Procedure Duration: 0 hours 15 minutes 6 seconds  Estimated Blood Loss:  Estimated blood loss: none.      [facility]

## 2023-08-10 NOTE — Anesthesia Preprocedure Evaluation (Signed)
 Anesthesia Evaluation  Patient identified by MRN, date of birth, ID band Patient awake    Reviewed: Allergy & Precautions, NPO status , Patient's Chart, lab work & pertinent test results  Airway Mallampati: II  TM Distance: >3 FB Neck ROM: full    Dental  (+) Chipped   Pulmonary neg pulmonary ROS   Pulmonary exam normal        Cardiovascular hypertension, negative cardio ROS Normal cardiovascular exam     Neuro/Psych negative neurological ROS  negative psych ROS   GI/Hepatic Neg liver ROS,GERD  Medicated,,  Endo/Other  negative endocrine ROSdiabetes    Renal/GU negative Renal ROS  negative genitourinary   Musculoskeletal   Abdominal   Peds  Hematology negative hematology ROS (+)   Anesthesia Other Findings Past Medical History: No date: Diabetes mellitus without complication (HCC) No date: Dysrhythmia No date: GERD (gastroesophageal reflux disease) No date: Hypertension No date: Leiomyoma of uterus, unspecified No date: Obesity No date: Pneumonia No date: Unspecified hypothyroidism  Past Surgical History: 08/15/2019: COLONOSCOPY WITH PROPOFOL; N/A     Comment:  Procedure: COLONOSCOPY WITH PROPOFOL;  Surgeon: Wyline Mood, MD;  Location: [facility] ENDOSCOPY;  Service:               Gastroenterology;  Laterality: N/A; 10/06/2019: COLONOSCOPY WITH PROPOFOL; N/A     Comment:  Procedure: COLONOSCOPY WITH PROPOFOL;  Surgeon: Wyline Mood, MD;  Location: [facility] ENDOSCOPY;  Service:               Gastroenterology;  Laterality: N/A; 10/20/2022: COLONOSCOPY WITH PROPOFOL; N/A     Comment:  Procedure: COLONOSCOPY WITH PROPOFOL;  Surgeon: Wyline Mood, MD;  Location: [facility] ENDOSCOPY;  Service:               Gastroenterology;  Laterality: N/A; 2000: DILATION AND CURETTAGE OF UTERUS     Reproductive/Obstetrics negative OB ROS                              Anesthesia Physical Anesthesia Plan  ASA: 3  Anesthesia Plan: General   Post-op Pain Management: Minimal or no pain anticipated   Induction: Intravenous  PONV Risk Score and Plan: 3 and Propofol infusion, TIVA and Ondansetron  Airway Management Planned: Nasal Cannula  Additional Equipment: None  Intra-op Plan:   Post-operative Plan:   Informed Consent: I have reviewed the patients History and Physical, chart, labs and discussed the procedure including the risks, benefits and alternatives for the proposed anesthesia with the patient or authorized representative who has indicated his/her understanding and acceptance.     Dental advisory given  Plan Discussed with: CRNA and Surgeon  Anesthesia Plan Comments: (Discussed risks of anesthesia with patient, including possibility of difficulty with spontaneous ventilation under anesthesia necessitating airway intervention, PONV, and rare risks such as cardiac or respiratory or neurological events, and allergic reactions. Discussed the role of CRNA in patient's perioperative care. Patient understands.)       Anesthesia Quick Evaluation

## 2023-08-11 ENCOUNTER — Encounter: Payer: Self-pay | Admitting: Gastroenterology

## 2023-09-08 ENCOUNTER — Other Ambulatory Visit: Payer: Self-pay | Admitting: Family Medicine

## 2023-09-08 DIAGNOSIS — N951 Menopausal and female climacteric states: Secondary | ICD-10-CM

## 2023-09-17 ENCOUNTER — Other Ambulatory Visit: Payer: Self-pay | Admitting: Family Medicine

## 2023-09-17 DIAGNOSIS — N951 Menopausal and female climacteric states: Secondary | ICD-10-CM

## 2023-09-21 ENCOUNTER — Other Ambulatory Visit: Payer: Self-pay

## 2023-09-21 DIAGNOSIS — N951 Menopausal and female climacteric states: Secondary | ICD-10-CM

## 2023-09-21 MED ORDER — ESTRADIOL 0.0375 MG/24HR TD PTTW: 1.0000 | MEDICATED_PATCH | TRANSDERMAL | 3 refills | Status: DC

## 2023-09-22 ENCOUNTER — Other Ambulatory Visit: Payer: Self-pay | Admitting: Internal Medicine

## 2023-10-20 ENCOUNTER — Other Ambulatory Visit: Payer: Self-pay | Admitting: Internal Medicine

## 2023-11-12 ENCOUNTER — Ambulatory Visit (INDEPENDENT_AMBULATORY_CARE_PROVIDER_SITE_OTHER): Admitting: Family Medicine

## 2023-11-12 VITALS — BP 138/92 | HR 81 | Ht 67.0 in | Wt 210.0 lb

## 2023-11-12 DIAGNOSIS — Z01419 Encounter for gynecological examination (general) (routine) without abnormal findings: Secondary | ICD-10-CM | POA: Diagnosis not present

## 2023-11-12 DIAGNOSIS — Z124 Encounter for screening for malignant neoplasm of cervix: Secondary | ICD-10-CM | POA: Diagnosis not present

## 2023-11-12 DIAGNOSIS — D251 Intramural leiomyoma of uterus: Secondary | ICD-10-CM

## 2023-11-12 DIAGNOSIS — R232 Flushing: Secondary | ICD-10-CM

## 2023-11-12 DIAGNOSIS — D250 Submucous leiomyoma of uterus: Secondary | ICD-10-CM

## 2023-11-12 DIAGNOSIS — Z1231 Encounter for screening mammogram for malignant neoplasm of breast: Secondary | ICD-10-CM | POA: Diagnosis not present

## 2023-11-12 DIAGNOSIS — N951 Menopausal and female climacteric states: Secondary | ICD-10-CM

## 2023-11-12 MED ORDER — ESTRADIOL 0.0375 MG/24HR TD PTTW: 1.0000 | MEDICATED_PATCH | TRANSDERMAL | 3 refills | Status: AC

## 2023-11-12 NOTE — Progress Notes (Signed)
 Patient presents for Annual.  LMP: No LMP recorded. (Menstrual status: IUD).  Last pap: Date: 07/2022-Abnormal-ASCUS HPV+ Contraception: IUD: Liletta -2017  Mammogram: Due, last mammogram: 2024 Colonoscopy:2025 STD Screening: not indicated Flu Vaccine : N/A  CC: Annual

## 2023-11-12 NOTE — Assessment & Plan Note (Signed)
 On vivelle  dot and has IUD for protection. Will need to change to po, change IUD at some point. It is likely ok for 1 more year. Offered OR for replacement and if needs colpo, consider this.

## 2023-11-12 NOTE — Progress Notes (Signed)
 Subjective:     Kristina Gardner is a 53 y.o. female and is here for a comprehensive physical exam. The patient reports no problems. On HRT with IUD in place x 8 years. No bleeding. Still having hot flashes. Has h/o ASCUS pap with + HPV--needs f/u.  The following portions of the patient's history were reviewed and updated as appropriate: allergies, current medications, past family history, past medical history, past social history, past surgical history, and problem list.  Review of Systems Pertinent items are noted in HPI.   Objective:  Chaperone present for exam   BP (!) 138/92   Pulse 81   Ht 5' 7 (1.702 m)   Wt 210 lb (95.3 kg)   BMI 32.89 kg/m  General appearance: alert, cooperative, and appears stated age Head: Normocephalic, without obvious abnormality, atraumatic Neck: no adenopathy, supple, symmetrical, trachea midline, and thyroid  not enlarged, symmetric, no tenderness/mass/nodules Lungs: clear to auscultation bilaterally Breasts: normal appearance, no masses or tenderness Heart: regular rate and rhythm, S1, S2 normal, no murmur, click, rub or gallop Abdomen: soft, non-tender; bowel sounds normal; no masses,  no organomegaly Pelvic: external genitalia normal, no adnexal masses or tenderness, no cervical motion tenderness, vagina normal without discharge, and uterus is enlarged, firm, calcified c/w fibroids. Cervix is not confidently seen or felt. Extremities: extremities normal, atraumatic, no cyanosis or edema Pulses: 2+ and symmetric Skin: Skin color, texture, turgor normal. No rashes or lesions Lymph nodes: Cervical, supraclavicular, and axillary nodes normal. Neurologic: Grossly normal    Assessment:    GYN female exam.      Plan:   Problem List Items Addressed This Visit       Unprioritized   Leiomyoma of uterus   Still present and not allowing good view of the cervix.      Menopausal symptoms   On vivelle  dot and has IUD for protection. Will need to  change to po, change IUD at some point. It is likely ok for 1 more year. Offered OR for replacement and if needs colpo, consider this.      Relevant Medications   estradiol  (VIVELLE -DOT) 0.0375 MG/24HR   Other Visit Diagnoses       Screening for malignant neoplasm of cervix    -  Primary   Relevant Orders   IGP, Aptima HPV, rfx 16/18,45     Encounter for gynecological examination without abnormal finding         Encounter for screening mammogram for malignant neoplasm of breast       Relevant Orders   MM 3D SCREENING MAMMOGRAM BILATERAL BREAST     Hot flashes             See After Visit Summary for Counseling Recommendations

## 2023-11-12 NOTE — Assessment & Plan Note (Signed)
 Still present and not allowing good view of the cervix.

## 2023-11-18 ENCOUNTER — Ambulatory Visit: Payer: Self-pay | Admitting: Family Medicine

## 2023-11-18 LAB — HPV GENOTYPES 16/18,45
HPV Genotype 16: POSITIVE — AB
HPV Genotype 18,45: NEGATIVE

## 2023-11-18 LAB — IGP, APTIMA HPV, RFX 16/18,45
HPV Aptima: POSITIVE — AB
PAP Smear Comment: 0

## 2023-12-02 ENCOUNTER — Other Ambulatory Visit: Payer: Self-pay | Admitting: Internal Medicine

## 2023-12-09 ENCOUNTER — Encounter: Payer: Self-pay | Admitting: Internal Medicine

## 2023-12-09 ENCOUNTER — Ambulatory Visit: Payer: Managed Care, Other (non HMO) | Admitting: Internal Medicine

## 2023-12-09 VITALS — BP 94/60 | HR 90 | Temp 98.6°F | Ht 66.5 in | Wt 210.0 lb

## 2023-12-09 DIAGNOSIS — Z7984 Long term (current) use of oral hypoglycemic drugs: Secondary | ICD-10-CM | POA: Diagnosis not present

## 2023-12-09 DIAGNOSIS — E1159 Type 2 diabetes mellitus with other circulatory complications: Secondary | ICD-10-CM

## 2023-12-09 DIAGNOSIS — D259 Leiomyoma of uterus, unspecified: Secondary | ICD-10-CM

## 2023-12-09 DIAGNOSIS — I10 Essential (primary) hypertension: Secondary | ICD-10-CM

## 2023-12-09 LAB — POCT GLYCOSYLATED HEMOGLOBIN (HGB A1C): Hemoglobin A1C: 6.1 % — AB (ref 4.0–5.6)

## 2023-12-09 MED ORDER — GABAPENTIN 100 MG PO CAPS: ORAL_CAPSULE | ORAL | 3 refills | Status: AC

## 2023-12-09 MED ORDER — HYDROCORTISONE 2.5 % EX CREA: TOPICAL_CREAM | Freq: Three times a day (TID) | CUTANEOUS | 3 refills | Status: AC | PRN

## 2023-12-09 NOTE — Assessment & Plan Note (Signed)
 Lab Results  Component Value Date   HGBA1C 6.1 (A) 12/09/2023   Good control on the metformin  Gabapentin  at bedtime

## 2023-12-09 NOTE — Progress Notes (Signed)
 Subjective:    Patient ID: Kristina Gardner, female    DOB: 02/10/1971, 53 y.o.   MRN: [id number]  HPI Here for follow up of diabetes and other chronic health conditions  Had a lightheaded spell a couple of weeks ago Santina out to dinner and had one sip of a drink Thought maybe dehydrated---went home and had gatorade Stayed in bed----and then it got better after a while No fever or illness May have had another wave of dizziness again since then---only a few seconds No chest pain or SOB No syncope  Hasn't been checking sugars No clear low sugar reactions---doesn't think this was the reason for lightheadedness At most brief foot numbness  Current Outpatient Medications on File Prior to Visit  Medication Sig Dispense Refill   aspirin EC 81 MG tablet Take 81 mg by mouth daily. Swallow whole.     DILT-XR 180 MG 24 hr capsule TAKE 1 CAPSULE BY MOUTH DAILY 90 capsule 3   estradiol  (VIVELLE -DOT) 0.0375 MG/24HR Place 1 patch onto the skin 2 (two) times a week. 24 patch 3   gabapentin  (NEURONTIN ) 100 MG capsule TAKE 1 TO 3 CAPSULES BY MOUTH AT BEDTIME 90 capsule 3   glucose blood (ONETOUCH VERIO) test strip Use to check blood sugar once a day 100 each 4   levothyroxine  (SYNTHROID ) 100 MCG tablet TAKE 1 TABLET BY MOUTH DAILY 90 tablet 3   losartan -hydrochlorothiazide (HYZAAR) 100-25 MG tablet TAKE 1 TABLET BY MOUTH EVERY DAY 90 tablet 3   metFORMIN  (GLUCOPHAGE -XR) 500 MG 24 hr tablet TAKE 2 TABLETS(1000 MG) BY MOUTH DAILY WITH BREAKFAST 180 tablet 3   omeprazole  (PRILOSEC) 20 MG capsule TAKE 1 CAPSULE BY MOUTH DAILY 180 capsule 3   rosuvastatin  (CRESTOR ) 5 MG tablet TAKE 1 TABLET(5 MG) BY MOUTH DAILY 90 tablet 3   No current facility-administered medications on file prior to visit.    Allergies  Allergen Reactions   Norvasc  [Amlodipine  Besylate] Other (See Comments)    dizziness    Past Medical History:  Diagnosis Date   Diabetes mellitus without complication (HCC)    Dysrhythmia     GERD (gastroesophageal reflux disease)    Hypertension    Leiomyoma of uterus, unspecified    Obesity    Pneumonia    Unspecified hypothyroidism     Past Surgical History:  Procedure Laterality Date   COLONOSCOPY WITH PROPOFOL  N/A 08/15/2019   Procedure: COLONOSCOPY WITH PROPOFOL ;  Surgeon: Therisa Bi, MD;  Location: [facility] ENDOSCOPY;  Service: Gastroenterology;  Laterality: N/A;   COLONOSCOPY WITH PROPOFOL  N/A 10/06/2019   Procedure: COLONOSCOPY WITH PROPOFOL ;  Surgeon: Therisa Bi, MD;  Location: [facility] ENDOSCOPY;  Service: Gastroenterology;  Laterality: N/A;   COLONOSCOPY WITH PROPOFOL  N/A 10/20/2022   Procedure: COLONOSCOPY WITH PROPOFOL ;  Surgeon: Therisa Bi, MD;  Location: [facility] ENDOSCOPY;  Service: Gastroenterology;  Laterality: N/A;   COLONOSCOPY WITH PROPOFOL  N/A 08/10/2023   Procedure: COLONOSCOPY WITH PROPOFOL ;  Surgeon: Therisa Bi, MD;  Location: [facility] ENDOSCOPY;  Service: Gastroenterology;  Laterality: N/A;   DILATION AND CURETTAGE OF UTERUS  2000    Family History  Problem Relation Age of Onset   Hypertension Mother    Cancer Mother        Bone Marrow    Kidney disease Mother    Arthritis Sister    Cancer Paternal Grandfather        lung cancer   Diabetes Father    Cancer Maternal Aunt        from snuff  Dementia Maternal Aunt    Bladder Cancer Maternal Aunt    Graves' disease Other        multiple    Social History   Socioeconomic History   Marital status: Single    Spouse name: Not on file   Number of children: 1   Years of education: Not on file   Highest education level: Some college, no degree  Occupational History   Occupation: [employer], customer service  Tobacco Use   Smoking status: Never    Passive exposure: Never   Smokeless tobacco: Never  Vaping Use   Vaping status: Never Used  Substance and Sexual Activity   Alcohol use: Yes    Comment: occasional,none last 24hrs   Drug use: No   Sexual activity: Yes    Partners: Male    Birth  control/protection: Condom, I.U.D.  Other Topics Concern   Not on file  Social History Narrative   1 daughter   Social Drivers of Health   Financial Resource Strain: Low Risk  (12/08/2023)   Overall Financial Resource Strain (CARDIA)    Difficulty of Paying Living Expenses: Not very hard  Food Insecurity: Food Insecurity Present (12/08/2023)   Hunger Vital Sign    Worried About Running Out of Food in the Last Year: Sometimes true    Ran Out of Food in the Last Year: Never true  Transportation Needs: No Transportation Needs (12/08/2023)   PRAPARE - Administrator, Civil Service (Medical): No    Lack of Transportation (Non-Medical): No  Physical Activity: Insufficiently Active (12/08/2023)   Exercise Vital Sign    Days of Exercise per Week: 3 days    Minutes of Exercise per Session: 10 min  Stress: No Stress Concern Present (12/08/2023)   [employer] of Occupational Health - Occupational Stress Questionnaire    Feeling of Stress: Only a little  Social Connections: Moderately Isolated (12/08/2023)   Social Connection and Isolation Panel    Frequency of Communication with Friends and Family: More than three times a week    Frequency of Social Gatherings with Friends and Family: Once a week    Attends Religious Services: 1 to 4 times per year    Active Member of [employer] or Organizations: No    Attends Engineer, structural: Not on file    Marital Status: Never married  Catering manager Violence: Not on file   Review of Systems Gabapentin  does help her sleep--occ advil PM instead Weight is stable    Objective:   Physical Exam Constitutional:      Appearance: Normal appearance.  Cardiovascular:     Rate and Rhythm: Normal rate and regular rhythm.     Pulses: Normal pulses.     Heart sounds: No murmur heard.    No gallop.  Pulmonary:     Effort: Pulmonary effort is normal.     Breath sounds: Normal breath sounds. No wheezing or rales.  Musculoskeletal:      Right lower leg: No edema.     Left lower leg: No edema.  Skin:    Comments: Scaly papule on inside of right ear--?actinic. Suggested derm evaluation (can try cortisone first in case seborrhea) No foot lesions  Neurological:     Mental Status: She is alert.            Assessment & Plan:

## 2023-12-09 NOTE — Assessment & Plan Note (Signed)
 BP Readings from Last 3 Encounters:  12/09/23 94/60  11/12/23 (!) 138/92  08/10/23 112/80   Good control on the diltiazem  If ongoing lightheadedness--would decrease the dose to 120

## 2023-12-09 NOTE — Assessment & Plan Note (Signed)
 Now has IUD stuck in there Will need surgery at some time to remove

## 2023-12-29 ENCOUNTER — Other Ambulatory Visit: Payer: Self-pay | Admitting: Family Medicine

## 2023-12-29 ENCOUNTER — Ambulatory Visit: Admitting: Family Medicine

## 2023-12-29 ENCOUNTER — Encounter: Payer: Self-pay | Admitting: Family Medicine

## 2023-12-29 VITALS — BP 131/88 | HR 99 | Ht 67.0 in | Wt 212.2 lb

## 2023-12-29 DIAGNOSIS — R87810 Cervical high risk human papillomavirus (HPV) DNA test positive: Secondary | ICD-10-CM

## 2023-12-29 NOTE — Progress Notes (Signed)
    GYNECOLOGY OFFICE COLPOSCOPY PROCEDURE NOTE  53 y.o. G1P1001 here for colposcopy for NILM, HPV 16 + pap smear on 11/12/2023. Discussed role for HPV in cervical dysplasia, need for surveillance.  Patient gave informed written consent, time out was performed.  Urine pregnancy test negative. Patient was placed in lithotomy position. Cervix viewed with speculum and colposcope after application of acetic acid.   Colposcopy adequate? Yes  no visible lesions; 2 random biopsies obtained.  ECC specimen obtained. All specimens were labeled and sent to pathology.  Chaperone was present during entire procedure.  Patient was given post procedure instructions.  Will follow up pathology and manage accordingly; patient will be contacted with results and recommendations.  Routine preventative health maintenance measures emphasized.    Glenys GORMAN Birk, MD 12/29/2023 5:15 PM

## 2024-01-01 LAB — ANATOMIC PATHOLOGY REPORT

## 2024-01-05 ENCOUNTER — Ambulatory Visit: Payer: Self-pay | Admitting: Family Medicine

## 2024-01-06 NOTE — Telephone Encounter (Signed)
-----   Message from Glenys GORMAN Birk sent at 01/05/2024  1:02 PM EDT ----- Level 1 change again. Consider CRYO for persistent level 1 change vs. Repeat pap in 1 year. ----- Message ----- From: Interface, [facility] Lab Results In Sent: 01/01/2024   1:36 PM EDT To: Glenys GORMAN Birk, MD

## 2024-01-06 NOTE — Telephone Encounter (Signed)
 Called pt to give her the results and give her Dr Suzzane recommendations, pt would like to go ahead and do cryo. Will reach out to the patient for an appointment once November schedule is ready.

## 2024-01-12 ENCOUNTER — Telehealth: Payer: Self-pay

## 2024-01-12 NOTE — Telephone Encounter (Signed)
 Called pt to schedule cryo appt. Pt states she does not want to schedule procedure at this time. Pt will call back when she is ready.

## 2024-01-28 ENCOUNTER — Ambulatory Visit (INDEPENDENT_AMBULATORY_CARE_PROVIDER_SITE_OTHER): Admitting: *Deleted

## 2024-01-28 VITALS — BP 129/85 | HR 88 | Ht 67.0 in | Wt 211.0 lb

## 2024-01-28 DIAGNOSIS — N898 Other specified noninflammatory disorders of vagina: Secondary | ICD-10-CM | POA: Diagnosis not present

## 2024-01-28 DIAGNOSIS — R140 Abdominal distension (gaseous): Secondary | ICD-10-CM

## 2024-01-28 NOTE — Progress Notes (Signed)
 SUBJECTIVE:  53 y.o. female complains of clear vaginal discharge for 3 week(s) and vaginal itching for 3 week, also some bloating so she thinks maybe a uti. Denies abnormal vaginal bleeding or significant pelvic pain or fever.Denies history of known exposure to STD.  No LMP recorded. (Menstrual status: IUD).  OBJECTIVE:  She appears alert, well appearing, in no apparent distress Urine dipstick: not done.  ASSESSMENT:  Vaginal Discharge  Vaginal itching bloating    PLAN:  Urine culture sent Nuswab Vaginitis plus probe and urine culture sent to lab. Treatment: To be determined once lab results are received ROV prn if symptoms persist or worsen.

## 2024-01-31 LAB — NUSWAB VAGINITIS PLUS (VG+)
Candida albicans, NAA: NEGATIVE
Candida glabrata, NAA: NEGATIVE
Chlamydia trachomatis, NAA: NEGATIVE
Neisseria gonorrhoeae, NAA: NEGATIVE
Trich vag by NAA: NEGATIVE

## 2024-02-01 ENCOUNTER — Ambulatory Visit: Payer: Self-pay | Admitting: Obstetrics & Gynecology

## 2024-03-21 ENCOUNTER — Telehealth: Admitting: Physician Assistant

## 2024-03-21 DIAGNOSIS — B9689 Other specified bacterial agents as the cause of diseases classified elsewhere: Secondary | ICD-10-CM | POA: Diagnosis not present

## 2024-03-21 DIAGNOSIS — N760 Acute vaginitis: Secondary | ICD-10-CM | POA: Diagnosis not present

## 2024-03-21 MED ORDER — METRONIDAZOLE 500 MG PO TABS: 500.0000 mg | ORAL_TABLET | Freq: Two times a day (BID) | ORAL | 0 refills | Status: AC

## 2024-03-21 NOTE — Progress Notes (Signed)

## 2024-04-14 ENCOUNTER — Other Ambulatory Visit: Payer: Self-pay

## 2024-04-14 MED ORDER — LEVOTHYROXINE SODIUM 100 MCG PO TABS: 100.0000 ug | ORAL_TABLET | Freq: Every day | ORAL | 0 refills | Status: AC

## 2024-04-14 NOTE — Telephone Encounter (Signed)
 Copied from CRM #[id number]. Topic: Clinical - Medication Refill >> Apr 14, 2024 12:43 PM Shereese L wrote: Medication: levothyroxine  (SYNTHROID ) 100 MCG tablet  Has the patient contacted their pharmacy? Yes (Agent: If no, request that the patient contact the pharmacy for the refill. If patient does not wish to contact the pharmacy document the reason why and proceed with request.) (Agent: If yes, when and what did the pharmacy advise?)  This is the patient's preferred pharmacy:  [id number] [place], [place] - [address] AT [facility] [address] [place] [address] Phone: [phone] Fax: [phone]   Is this the correct pharmacy for this prescription? Yes If no, delete pharmacy and type the correct one.   Has the prescription been filled recently? Yes  Is the patient out of the medication? Yes  Has the patient been seen for an appointment in the last year OR does the patient have an upcoming appointment? Yes  Can we respond through MyChart? Yes  Agent: Please be advised that Rx refills may take up to 3 business days. We ask that you follow-up with your pharmacy.

## 2024-05-02 ENCOUNTER — Other Ambulatory Visit: Payer: Self-pay

## 2024-05-02 MED ORDER — METFORMIN HCL ER 500 MG PO TB24: ORAL_TABLET | ORAL | 0 refills | Status: AC

## 2024-05-27 ENCOUNTER — Other Ambulatory Visit: Payer: Self-pay

## 2024-05-27 MED ORDER — ROSUVASTATIN CALCIUM 5 MG PO TABS: 5.0000 mg | ORAL_TABLET | Freq: Every day | ORAL | 0 refills | Status: AC

## 2024-05-27 NOTE — Telephone Encounter (Signed)
 SABRA

## 2024-06-14 ENCOUNTER — Encounter
